# Patient Record
Sex: Female | Born: 1950 | Race: Black or African American | Hispanic: No | Smoking: Never smoker
Health system: Southern US, Community
[De-identification: ages and names within clinical notes are randomized; demographics above are authoritative.]

## PROBLEM LIST (undated history)

## (undated) DIAGNOSIS — I110 Hypertensive heart disease with heart failure: Secondary | ICD-10-CM

## (undated) DIAGNOSIS — E7800 Pure hypercholesterolemia, unspecified: Secondary | ICD-10-CM

## (undated) DIAGNOSIS — R0600 Dyspnea, unspecified: Secondary | ICD-10-CM

## (undated) DIAGNOSIS — M5135 Other intervertebral disc degeneration, thoracolumbar region: Secondary | ICD-10-CM

## (undated) DIAGNOSIS — M5030 Other cervical disc degeneration, unspecified cervical region: Secondary | ICD-10-CM

## (undated) DIAGNOSIS — E119 Type 2 diabetes mellitus without complications: Secondary | ICD-10-CM

## (undated) DIAGNOSIS — K219 Gastro-esophageal reflux disease without esophagitis: Secondary | ICD-10-CM

## (undated) DIAGNOSIS — I6789 Other cerebrovascular disease: Secondary | ICD-10-CM

## (undated) DIAGNOSIS — I10 Essential (primary) hypertension: Secondary | ICD-10-CM

## (undated) DIAGNOSIS — F32A Depression, unspecified: Secondary | ICD-10-CM

## (undated) DIAGNOSIS — I4510 Unspecified right bundle-branch block: Secondary | ICD-10-CM

## (undated) DIAGNOSIS — M1990 Unspecified osteoarthritis, unspecified site: Secondary | ICD-10-CM

## (undated) DIAGNOSIS — N183 Chronic kidney disease, stage 3 unspecified: Secondary | ICD-10-CM

## (undated) DIAGNOSIS — G4733 Obstructive sleep apnea (adult) (pediatric): Secondary | ICD-10-CM

## (undated) DIAGNOSIS — I5020 Unspecified systolic (congestive) heart failure: Secondary | ICD-10-CM

## (undated) HISTORY — PX: COLONOSCOPY: SHX174

## (undated) HISTORY — PX: BACK SURGERY: SHX140

## (undated) HISTORY — PX: CHOLECYSTECTOMY: SHX55

---

## 1973-08-21 HISTORY — PX: TUBAL LIGATION: SHX77

## 2004-08-21 HISTORY — PX: ROTATOR CUFF REPAIR: SHX139

## 2012-08-21 HISTORY — PX: TOTAL KNEE ARTHROPLASTY: SHX125

## 2013-04-24 ENCOUNTER — Ambulatory Visit: Payer: Self-pay | Admitting: General Practice

## 2013-04-24 LAB — URINALYSIS, COMPLETE
Glucose,UR: NEGATIVE mg/dL (ref 0–75)
Ketone: NEGATIVE
Nitrite: NEGATIVE
RBC,UR: NONE SEEN /HPF (ref 0–5)
Specific Gravity: 1.01 (ref 1.003–1.030)
Squamous Epithelial: 1
WBC UR: <1 /HPF (ref 0–5)

## 2013-04-24 LAB — BASIC METABOLIC PANEL
Anion Gap: 3 — ABNORMAL LOW (ref 7–16)
Calcium, Total: 9.7 mg/dL (ref 8.5–10.1)
Chloride: 104 mmol/L (ref 98–107)
Co2: 30 mmol/L (ref 21–32)
EGFR (Non-African Amer.): >60

## 2013-04-24 LAB — APTT: Activated PTT: 35.8 secs (ref 23.6–35.9)

## 2013-04-24 LAB — CBC
MCHC: 34.6 g/dL (ref 32.0–36.0)
MCV: 87 fL (ref 80–100)
Platelet: 240 10*3/uL (ref 150–440)
RDW: 13.3 % (ref 11.5–14.5)

## 2013-04-24 LAB — PROTIME-INR: INR: 1.1

## 2013-04-28 ENCOUNTER — Ambulatory Visit: Payer: Self-pay | Admitting: General Practice

## 2013-06-03 ENCOUNTER — Ambulatory Visit: Payer: Self-pay | Admitting: General Practice

## 2013-06-09 ENCOUNTER — Inpatient Hospital Stay: Payer: Self-pay | Admitting: General Practice

## 2013-06-10 LAB — BASIC METABOLIC PANEL
Calcium, Total: 8.5 mg/dL (ref 8.5–10.1)
Co2: 26 mmol/L (ref 21–32)
Creatinine: 0.86 mg/dL (ref 0.60–1.30)
EGFR (African American): >60
Osmolality: 280 (ref 275–301)
Sodium: 140 mmol/L (ref 136–145)

## 2013-06-10 LAB — PLATELET COUNT: Platelet: 183 10*3/uL (ref 150–440)

## 2013-06-11 LAB — BASIC METABOLIC PANEL
Anion Gap: 3 — ABNORMAL LOW (ref 7–16)
Calcium, Total: 8.5 mg/dL (ref 8.5–10.1)
Co2: 29 mmol/L (ref 21–32)
EGFR (African American): >60
EGFR (Non-African Amer.): >60
Osmolality: 280 (ref 275–301)
Potassium: 3.7 mmol/L (ref 3.5–5.1)

## 2013-06-11 LAB — HEMOGLOBIN: HGB: 9.9 g/dL — ABNORMAL LOW (ref 12.0–16.0)

## 2014-12-11 NOTE — Discharge Summary (Signed)
PATIENT NAME:  Diana Green, Diana Green MR#:  [id number] DATE OF BIRTH:  04-May-1951  DATE OF ADMISSION:  06/09/2013 DATE OF DISCHARGE:  06/12/2013   ADMITTING DIAGNOSIS: Degenerative arthrosis of the left knee.  POSTOPERATIVE DIAGNOSIS: Degenerative arthrosis of the left knee.  PROCEDURE: The patient had a left total knee arthroplasty using computer-aided navigation.   SURGEON: Illene LabradorJames P. Angie FavaHooten Jr., MD   ANESTHESIA: Spinal.   ESTIMATED BLOOD LOSS: 100 mL.   TOURNIQUET TIME: 93 minutes.   DRAINS: Two medium drains to reinfusion system.   IMPLANTS USED: DePuy PFC Sigma size 4 posterior stabilized femoral component cemented, size 4 MBT tibial component (cemented), 41 mm 3 peg oval dome patella cemented, 10 mm stabilized rotating platform polyethylene insert. Gentamicin bone cement was utilized.   The patient was stabilized, brought down to the recovery room and treated with physical therapy and pain control.   HISTORY: The patient is a 64 year old female who presented for an upcoming left total knee arthroplasty. The patient has been refractory to conservative treatment. The patient has tried activity modifications as well as cortisone injections. The patient has tried Synvisc with no relief.   PHYSICAL EXAMINATION:  GENERAL: Alert female with antalgic gait and a valgus thrust to the left knee.  LUNGS: Clear to auscultation.  CARDIOVASCULAR: Regular rate and rhythm with no murmur.  MUSCULOSKELETAL: In regard to the left lower extremity, the patient does have positive effusion with tenderness laterally. The patient has full extension at 103 degrees of flexion.   HOSPITAL COURSE: After initial admission on 06/09/2013, the patient was brought to the orthopedic floor. The patient had a hemoglobin on postoperative day 1 of 9.9, which remained there at 9.9 on postoperative day 2. The patient did receive 500 mL of Autovac re-transfusion. The patient did well with physical therapy, initially bed to chair,  and progressed up to ambulating 60 feet and had up to 95 degrees range of motion with flexion.   CONDITION AT DISCHARGE: Stable.   DISPOSITION: The patient was sent to rehab for physical therapy and occupational therapy.   DISCHARGE INSTRUCTIONS: The [facility] with Van ClinesJon Wolfe. Activity: The patient will do weight bear as tolerated on the affected leg. The patient will raise the left leg with 1 to 2 pillows and use thigh-high TED hose on both legs, to be removed 1 hour every 8-hour shift. The patient will have her heels elevated off the bed and be encouraged to do cough and deep breathing. The patient will have regular diet. The patient will use Polar Care to decrease swelling and try not to get the dressing wet or dirty and try to leave it on at this time, although have it changed on a p.r.n. basis at rehab.  The rehab center will call the clinic if there is any bright red bleeding or fever greater than 101.5, any calf pain or bowel or bladder difficulty. The patient will do physical therapy and occupational therapy per protocol.   DISCHARGE MEDICATIONS:  1. Tribenzor 5 mg 1 tablet daily.  2. Zocor 20 mg p.o. daily.  3. Atenolol 100 mg 1 tablet daily.  4. Tylenol 500 mg 1 tablet q.4 hours as needed for pain or fever greater than 100.4.  5. Oxycodone 5 mg q.4 hours as needed for severe pain. 6. Celebrex 200 mg p.o. b.i.d.  7. Milk of Magnesia 30 mL b.i.d.  8. Lovenox 40 mg subcutaneous once a day for 14 days, then discontinue  and begin aspirin 81 mg daily.  9. Paroxetine 20 mg 1 tablet p.o. daily.  10. Senokot-S 1 tablet p.o. b.i.d. 11. Pantoprazole 40 mg p.o. b.i.d.     ____________________________ J. Dedra Skeens, [place] jtm:lb D: 06/12/2013 07:27:53 ET T: 06/12/2013 07:53:02 ET JOB#: [id number]  cc: J. Dedra Skeens, [place], <Dictator> J Kanai Berrios [facility] PA ELECTRONICALLY SIGNED 06/14/2013 6:04

## 2014-12-11 NOTE — Op Note (Signed)
PATIENT NAME:  Diana Green, Diana Green MR#:  [id number] DATE OF BIRTH:  [birth year]  DATE OF PROCEDURE:  06/09/2013  PREOPERATIVE DIAGNOSIS: Degenerative arthrosis of the left knee.   POSTOPERATIVE DIAGNOSIS: Degenerative arthrosis of the right knee.   PROCEDURE PERFORMED: Left total knee arthroplasty using computer-assisted navigation.   SURGEON: Francesco Sor, M.D.   ASSISTANT: Van Clines, PA (required to maintain retraction throughout the procedure).   ANESTHESIA: Spinal.   ESTIMATED BLOOD LOSS: 100 mL.   FLUIDS REPLACED: 2800 mL of crystalloid.   TOURNIQUET TIME: 93 minutes.   DRAINS: Two medium drains to reinfusion system.   SOFT TISSUE RELEASES: Anterior cruciate ligament, posterior cruciate ligament, deep medial collateral ligament, patellofemoral ligament, and posterolateral corner.   IMPLANTS UTILIZED: DePuy PFC Sigma size 4 posterior stabilized femoral component (cemented), size 4 MBT tibial component (cemented), 41 mm 3 peg oval dome patella (cemented), and a 10 mm stabilized rotating platform polyethylene insert. Gentamicin bone cement was utilized.   INDICATIONS FOR SURGERY: The patient is a 64 year old female who has been seen for complaints of progressive left knee pain. X-rays demonstrated severe degenerative changes in tricompartmental fashion with a relative valgus deformity. After discussion of the risks and benefits of surgical intervention, the patient expressed understanding of the risks and benefits and agreed with plans for surgical intervention.   PROCEDURE IN DETAIL: The patient was brought into the Operating Room and, after adequate spinal anesthesia was achieved, a tourniquet was placed on the patient's upper left thigh. The patient's left knee and leg were cleaned and prepped with alcohol and DuraPrep, draped in the usual sterile fashion. A "timeout" was performed as per usual protocol. The left lower extremity was exsanguinated using an Esmarch, and the tourniquet was  inflated to 300 mmHg.   An anterior longitudinal incision was made, followed by a standard mid vastus approach. A moderate effusion was evacuated. The deep fibers of the medial collateral ligament were elevated in a subperiosteal fashion off the medial flare of the tibia so as to maintain a continuous soft tissue sleeve. The patella was subluxed laterally and the patellofemoral ligament was incised.   Inspection of the knee demonstrated severe degenerative changes with full-thickness loss of articular cartilage noted to all compartments. Prominent osteophytes were debrided using a rongeur. Anterior and posterior cruciate ligaments were excised. Two 4.0 mm Schanz pins were inserted into the femur and into the tibia for attachment of the ray of trackers used for computer-assisted navigation. Hip center was identified using circumduction technique. Distal landmarks were mapped using the computer. The distal femur and proximal tibia were mapped using the computer.   Distal femoral cutting guide was positioned using computer-assisted navigation so as to achieve a 5 degree distal valgus cut. Cut was performed and verified using the computer. Distal femur was sized, and it was felt that a size 4 femoral component was appropriate. A size 4 cutting guide was positioned, and the anterior cut was performed and verified using the computer. This was followed by completion of the posterior and chamfer cuts. Femoral cutting guide for the central box was then positioned and the central box cut was performed.   Attention was then directed to the proximal tibia. Medial and lateral menisci were excised. The extramedullary tibial cutting guide was positioned using computer-assisted navigation so as to achieve a 0 degree varus valgus alignment and 0 degrees posterior slope. Cut was performed and verified using the computer. The proximal tibia was sized, and it was felt that a size 4  tibial tray was appropriate. Tibial and  femoral trials were inserted, followed by insertion of a 10 mm polyethylene insert.   The knee was felt to be tight laterally. Trial components were removed and the knee was brought into full extension and distracted using Moreland retractors. The posterolateral corner was then carefully released using a combination of electrocautery and Metzenbaum scissors. This allowed for good balance with the knee in extension. Trial components were reinserted. Prominent posterior osteophytes were debrided using osteotomes and curettes. A 10 mm polyethylene trial was inserted and the knee was placed through range of motion. Excellent mediolateral soft tissue balancing was appreciated both in full extension and in flexion.   Finally, the patella was cut and prepared so as to accommodate a 41 mm three peg oval dome patella. Patellar trial was placed and the knee was placed through a range of motion with excellent patellar tracking appreciated. The femoral component trial was removed. Central post hole for the tibial component was reamed, followed by insertion of a keel punch. Tibial tray was then removed. The cut surfaces of bone were irrigated with copious amounts of normal saline with antibiotic solution using pulsatile lavage and then suctioned dry.   Polymethyl methacrylate cement with gentamicin was prepared in the usual fashion using a vacuum mixer. Cement was applied to the cut surface of the proximal tibia as well as along the undersurface of a size 4 MBT tibial component. The tibial component was positioned and impacted into place. Excess cement was removed using Personal assistantreer elevators. Cement was then applied to the cut surface of the femur as well as along the posterior flanges of a size 4 posterior stabilized femoral component. Femoral component was positioned and impacted into place. Excess cement was removed using Personal assistantreer elevators. A 10 mm polyethylene trial was inserted and the knee was brought in full extension with  steady axial compression applied. Finally, cement was applied to the backside of a 41 mm three peg oval dome patella, and patellar component was positioned and patellar clamp applied. Excess cement was removed using Personal assistantreer elevators.   After adequate curing of cement, the tourniquet was deflated after total tourniquet time of 93 minutes. Hemostasis was achieved using electrocautery. The knee was irrigated with copious amounts of normal saline with antibiotic solution, using pulsatile lavage, and then suctioned dry. The knee was inspected for any residual cement debris. 20 mL of 1.3% Exparel in 40 mL of normal saline was injected along the posterior capsule, medial and lateral recesses, and along the arthrotomy site.   A 10 mm stabilized rotating platform polyethylene insert was inserted, and the knee was placed through a range of motion with excellent mediolateral soft tissue balancing appreciated and excellent patellar tracking noted. Two medium drains were placed in the wound bed and brought out through a separate stab incision to be attached to a reinfusion system. The medial parapatellar portion of the arthrotomy was then reapproximated using interrupted sutures of #1 Vicryl. The subcutaneous tissue was approximated in layers using first 0 Vicryl, followed by 2-0 Vicryl. 30 mL of 0.25% Marcaine with epinephrine was injected in the subcutaneous tissue along the skin incision. Skin was then reapproximated using skin staples. A sterile dressing was applied.   The patient tolerated the procedure well. She was transported to the recovery room in stable condition.    ____________________________ Illene LabradorJames P. Angie FavaHooten Jr., MD jph:cg D: 06/10/2013 06:25:11 ET T: 06/10/2013 06:58:35 ET JOB#: [id number]  cc: Illene LabradorJames P. Angie FavaHooten Jr., MD, <Dictator> JAMES P  Angie Fava MD ELECTRONICALLY SIGNED 06/10/2013 22:35

## 2017-04-28 ENCOUNTER — Emergency Department: Payer: Medicare PPO

## 2017-04-28 ENCOUNTER — Encounter: Payer: Self-pay | Admitting: Emergency Medicine

## 2017-04-28 ENCOUNTER — Emergency Department
Admission: EM | Admit: 2017-04-28 | Discharge: 2017-04-28 | Disposition: A | Discharge status: Home / Self Care | Payer: Medicare PPO | Attending: Emergency Medicine | Admitting: Emergency Medicine

## 2017-04-28 DIAGNOSIS — I10 Essential (primary) hypertension: Secondary | ICD-10-CM | POA: Diagnosis not present

## 2017-04-28 DIAGNOSIS — M791 Myalgia: Secondary | ICD-10-CM | POA: Diagnosis not present

## 2017-04-28 DIAGNOSIS — M7918 Myalgia, other site: Secondary | ICD-10-CM

## 2017-04-28 HISTORY — DX: Essential (primary) hypertension: I10

## 2017-04-28 HISTORY — DX: Pure hypercholesterolemia, unspecified: E78.00

## 2017-04-28 MED ORDER — OXYCODONE-ACETAMINOPHEN 5-325 MG PO TABS
1.0000 | ORAL_TABLET | Freq: Once | ORAL | Status: AC
Start: 2017-04-28 — End: 2017-04-28
  Administered 2017-04-28: 1 via ORAL
  Filled 2017-04-28: qty 1

## 2017-04-28 MED ORDER — BACLOFEN 10 MG PO TABS: 5.0000 mg | ORAL_TABLET | Freq: Two times a day (BID) | ORAL | 0 refills | Status: AC

## 2017-04-28 MED ORDER — IBUPROFEN 600 MG PO TABS: 600.0000 mg | ORAL_TABLET | Freq: Four times a day (QID) | ORAL | 0 refills | Status: DC | PRN

## 2017-04-28 NOTE — ED Notes (Signed)
Pt alert and oriented X4, active, cooperative, pt in NAD. RR even and unlabored, color WNL.  Pt informed to return if any life threatening symptoms occur.   
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
## 2020-05-17 ENCOUNTER — Other Ambulatory Visit: Payer: Self-pay

## 2020-05-17 ENCOUNTER — Encounter: Payer: Self-pay | Admitting: Emergency Medicine

## 2020-05-17 ENCOUNTER — Emergency Department
Admission: EM | Admit: 2020-05-17 | Discharge: 2020-05-17 | Disposition: A | Discharge status: Home / Self Care | Payer: Medicare Other | Attending: Emergency Medicine | Admitting: Emergency Medicine

## 2020-05-17 ENCOUNTER — Emergency Department: Payer: Medicare Other

## 2020-05-17 DIAGNOSIS — I10 Essential (primary) hypertension: Secondary | ICD-10-CM | POA: Diagnosis not present

## 2020-05-17 DIAGNOSIS — M545 Low back pain, unspecified: Secondary | ICD-10-CM

## 2020-05-17 DIAGNOSIS — M542 Cervicalgia: Secondary | ICD-10-CM | POA: Diagnosis present

## 2020-05-17 DIAGNOSIS — M25562 Pain in left knee: Secondary | ICD-10-CM | POA: Insufficient documentation

## 2020-05-17 DIAGNOSIS — Z79899 Other long term (current) drug therapy: Secondary | ICD-10-CM | POA: Diagnosis not present

## 2020-05-17 MED ORDER — MELOXICAM 7.5 MG PO TABS: 7.5000 mg | ORAL_TABLET | Freq: Every day | ORAL | 0 refills | Status: AC

## 2020-05-17 MED ORDER — TIZANIDINE HCL 4 MG PO TABS: 4.0000 mg | ORAL_TABLET | Freq: Three times a day (TID) | ORAL | 0 refills | Status: AC
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
## 2021-03-24 ENCOUNTER — Other Ambulatory Visit: Payer: Self-pay

## 2021-05-05 ENCOUNTER — Other Ambulatory Visit: Payer: Self-pay | Admitting: Neurosurgery

## 2021-05-05 ENCOUNTER — Other Ambulatory Visit (HOSPITAL_COMMUNITY): Payer: Self-pay | Admitting: Neurosurgery

## 2021-05-05 DIAGNOSIS — G959 Disease of spinal cord, unspecified: Secondary | ICD-10-CM

## 2021-05-05 DIAGNOSIS — M5104 Intervertebral disc disorders with myelopathy, thoracic region: Secondary | ICD-10-CM

## 2021-05-05 DIAGNOSIS — M48062 Spinal stenosis, lumbar region with neurogenic claudication: Secondary | ICD-10-CM

## 2021-05-24 ENCOUNTER — Ambulatory Visit
Admission: RE | Admit: 2021-05-24 | Discharge: 2021-05-24 | Disposition: A | Discharge status: Home / Self Care | Payer: Medicare (Managed Care) | Source: Ambulatory Visit | Attending: Neurosurgery | Admitting: Neurosurgery

## 2021-05-24 ENCOUNTER — Other Ambulatory Visit: Payer: Self-pay

## 2021-05-24 DIAGNOSIS — G959 Disease of spinal cord, unspecified: Secondary | ICD-10-CM

## 2021-05-24 DIAGNOSIS — M5104 Intervertebral disc disorders with myelopathy, thoracic region: Secondary | ICD-10-CM | POA: Diagnosis present

## 2021-05-24 DIAGNOSIS — M48062 Spinal stenosis, lumbar region with neurogenic claudication: Secondary | ICD-10-CM

## 2021-06-09 ENCOUNTER — Other Ambulatory Visit: Payer: Self-pay | Admitting: Internal Medicine

## 2021-06-09 DIAGNOSIS — Z1231 Encounter for screening mammogram for malignant neoplasm of breast: Secondary | ICD-10-CM

## 2021-06-13 ENCOUNTER — Other Ambulatory Visit: Payer: Self-pay | Admitting: Neurosurgery

## 2021-06-28 ENCOUNTER — Other Ambulatory Visit: Payer: Self-pay

## 2021-06-28 ENCOUNTER — Other Ambulatory Visit
Admission: RE | Admit: 2021-06-28 | Discharge: 2021-06-28 | Disposition: A | Discharge status: Home / Self Care | Payer: Medicare (Managed Care) | Source: Ambulatory Visit | Attending: Neurosurgery | Admitting: Neurosurgery

## 2021-06-28 DIAGNOSIS — I10 Essential (primary) hypertension: Secondary | ICD-10-CM | POA: Diagnosis not present

## 2021-06-28 DIAGNOSIS — Z01818 Encounter for other preprocedural examination: Secondary | ICD-10-CM | POA: Insufficient documentation

## 2021-06-28 DIAGNOSIS — R54 Age-related physical debility: Secondary | ICD-10-CM | POA: Diagnosis not present

## 2021-06-28 HISTORY — DX: Depression, unspecified: F32.A

## 2021-06-28 HISTORY — DX: Unspecified osteoarthritis, unspecified site: M19.90

## 2021-06-28 LAB — BASIC METABOLIC PANEL
Anion gap: 7 (ref 5–15)
BUN: 18 mg/dL (ref 8–23)
CO2: 31 mmol/L (ref 22–32)
Calcium: 10.2 mg/dL (ref 8.9–10.3)
Chloride: 100 mmol/L (ref 98–111)
Creatinine, Ser: 1.11 mg/dL — ABNORMAL HIGH (ref 0.44–1.00)
GFR, Estimated: 53 mL/min — ABNORMAL LOW (ref >60)
Glucose, Bld: 105 mg/dL — ABNORMAL HIGH (ref 70–99)
Potassium: 4.3 mmol/L (ref 3.5–5.1)
Sodium: 138 mmol/L (ref 135–145)

## 2021-06-28 LAB — CBC
HCT: 35.5 % — ABNORMAL LOW (ref 36.0–46.0)
Hemoglobin: 11.4 g/dL — ABNORMAL LOW (ref 12.0–15.0)
MCH: 29.5 pg (ref 26.0–34.0)
MCHC: 32.1 g/dL (ref 30.0–36.0)
MCV: 91.7 fL (ref 80.0–100.0)
Platelets: 238 10*3/uL (ref 150–400)
RBC: 3.87 MIL/uL (ref 3.87–5.11)
RDW: 12.5 % (ref 11.5–15.5)
WBC: 4.9 10*3/uL (ref 4.0–10.5)
nRBC: 0 % (ref 0.0–0.2)

## 2021-06-28 LAB — URINALYSIS, ROUTINE W REFLEX MICROSCOPIC
Bilirubin Urine: NEGATIVE
Glucose, UA: NEGATIVE mg/dL
Hgb urine dipstick: NEGATIVE
Ketones, ur: NEGATIVE mg/dL
Leukocytes,Ua: NEGATIVE
Nitrite: NEGATIVE
Protein, ur: NEGATIVE mg/dL
Specific Gravity, Urine: 1.021 (ref 1.005–1.030)
pH: 5 (ref 5.0–8.0)

## 2021-06-28 LAB — APTT: aPTT: 43 seconds — ABNORMAL HIGH (ref 24–36)

## 2021-06-28 LAB — SURGICAL PCR SCREEN
MRSA, PCR: NEGATIVE
Staphylococcus aureus: NEGATIVE

## 2021-06-28 LAB — PROTIME-INR
INR: 1 (ref 0.8–1.2)
Prothrombin Time: 13.3 seconds (ref 11.4–15.2)

## 2021-06-28 LAB — TYPE AND SCREEN
ABO/RH(D): A POS
Antibody Screen: NEGATIVE

## 2021-06-28 NOTE — Patient Instructions (Addendum)
Your procedure is scheduled on:  Friday, November 18 Report to the Registration Desk on the 1st floor of the [facility] To find out your arrival time, please call [phone] between 1PM - 3PM on: Thursday, November 17  REMEMBER: Instructions that are not followed completely may result in serious medical risk, up to and including death; or upon the discretion of your surgeon and anesthesiologist your surgery may need to be rescheduled.  Do not eat food after midnight the night before surgery.  No gum chewing, lozengers or hard candies.  You may however, drink CLEAR liquids up to 2 hours before you are scheduled to arrive for your surgery. Do not drink anything within 2 hours of your scheduled arrival time.  Clear liquids include: - water  - apple juice without pulp - gatorade (not RED, PURPLE, OR BLUE) - black coffee or tea (Do NOT add milk or creamers to the coffee or tea) Do NOT drink anything that is not on this list.  TAKE THESE MEDICATIONS THE MORNING OF SURGERY WITH A SIP OF WATER:  Atenolol Duloxetine (cymbalta) Gabapentin  One week prior to surgery: starting November 11 Stop CELEBREX, Anti-inflammatories (NSAIDS) such as Advil, Aleve, Ibuprofen, Motrin, Naproxen, Naprosyn and Aspirin based products such as Excedrin, Goodys Powder, BC Powder. Stop ANY OVER THE COUNTER supplements until after surgery. You may however, continue to take Tylenol if needed for pain up until the day of surgery.  No Alcohol for 24 hours before or after surgery.  No Smoking including e-cigarettes for 24 hours prior to surgery.  No chewable tobacco products for at least 6 hours prior to surgery.  No nicotine patches on the day of surgery.  Do not use any "recreational" drugs for at least a week prior to your surgery.  Please be advised that the combination of cocaine and anesthesia may have negative outcomes, up to and including death. If you test positive for cocaine, your surgery will be  cancelled.  On the morning of surgery brush your teeth with toothpaste and water, you may rinse your mouth with mouthwash if you wish. Do not swallow any toothpaste or mouthwash.  Use CHG Soap or wipes as directed on instruction sheet.  Do not wear jewelry, make-up, hairpins, clips or nail polish.  Do not wear lotions, powders, or perfumes.   Do not shave body from the neck down 48 hours prior to surgery just in case you cut yourself which could leave a site for infection.  Also, freshly shaved skin may become irritated if using the CHG soap.  Contact lenses, hearing aids and dentures may not be worn into surgery.  Do not bring valuables to the hospital. [facility] is not responsible for any missing/lost belongings or valuables.   Notify your doctor if there is any change in your medical condition (cold, fever, infection).  Wear comfortable clothing (specific to your surgery type) to the hospital.  After surgery, you can help prevent lung complications by doing breathing exercises.  Take deep breaths and cough every 1-2 hours. Your doctor may order a device called an Incentive Spirometer to help you take deep breaths.  If you are being admitted to the hospital overnight, leave your suitcase in the car. After surgery it may be brought to your room.  If you are being discharged the day of surgery, you will not be allowed to drive home. You will need a responsible adult (18 years or older) to drive you home and stay with you that  night.   If you are taking public transportation, you will need to have a responsible adult (18 years or older) with you. Please confirm with your physician that it is acceptable to use public transportation.   Please call the Pre-admissions Testing Dept. at [phone] if you have any questions about these instructions.  Surgery Visitation Policy:  Patients undergoing a surgery or procedure may have one family member or support person with them as  long as that person is not COVID-19 positive or experiencing its symptoms.  That person may remain in the waiting area during the procedure and may rotate out with other people.  Inpatient Visitation:    Visiting hours are 7 a.m. to 8 p.m. Up to two visitors ages 16+ are allowed at one time in a patient room. The visitors may rotate out with other people during the day. Visitors must check out when they leave, or other visitors will not be allowed. One designated support person may remain overnight. The visitor must pass COVID-19 screenings, use hand sanitizer when entering and exiting the patient's room and wear a mask at all times, including in the patient's room. Patients must also wear a mask when staff or their visitor are in the room. Masking is required regardless of vaccination status.

## 2021-06-29 ENCOUNTER — Ambulatory Visit
Admission: RE | Admit: 2021-06-29 | Discharge: 2021-06-29 | Disposition: A | Discharge status: Home / Self Care | Payer: Medicare (Managed Care) | Source: Ambulatory Visit | Attending: Internal Medicine | Admitting: Internal Medicine

## 2021-06-29 DIAGNOSIS — Z1231 Encounter for screening mammogram for malignant neoplasm of breast: Secondary | ICD-10-CM | POA: Diagnosis present

## 2021-07-06 ENCOUNTER — Other Ambulatory Visit: Payer: Self-pay

## 2021-07-06 ENCOUNTER — Other Ambulatory Visit
Admission: RE | Admit: 2021-07-06 | Discharge: 2021-07-06 | Disposition: A | Discharge status: Home / Self Care | Payer: Medicare (Managed Care) | Source: Ambulatory Visit | Attending: Neurosurgery | Admitting: Neurosurgery

## 2021-07-06 DIAGNOSIS — Z20822 Contact with and (suspected) exposure to covid-19: Secondary | ICD-10-CM | POA: Diagnosis not present

## 2021-07-06 DIAGNOSIS — Z01812 Encounter for preprocedural laboratory examination: Secondary | ICD-10-CM | POA: Insufficient documentation

## 2021-07-07 LAB — SARS CORONAVIRUS 2 (TAT 6-24 HRS): SARS Coronavirus 2: NEGATIVE

## 2021-07-08 ENCOUNTER — Other Ambulatory Visit: Payer: Self-pay

## 2021-07-08 ENCOUNTER — Ambulatory Visit: Payer: Medicare (Managed Care) | Admitting: Urgent Care

## 2021-07-08 ENCOUNTER — Encounter: Payer: Self-pay | Admitting: Neurosurgery

## 2021-07-08 ENCOUNTER — Ambulatory Visit: Payer: Medicare (Managed Care)

## 2021-07-08 ENCOUNTER — Encounter
Admission: RE | Disposition: A | Discharge status: Home / Self Care | Payer: Self-pay | Source: Home / Self Care | Attending: Neurosurgery

## 2021-07-08 ENCOUNTER — Observation Stay
Admission: RE | Admit: 2021-07-08 | Discharge: 2021-07-10 | Disposition: A | Discharge status: Home / Self Care | Payer: Medicare (Managed Care) | Attending: Neurosurgery | Admitting: Neurosurgery

## 2021-07-08 DIAGNOSIS — Z79899 Other long term (current) drug therapy: Secondary | ICD-10-CM | POA: Insufficient documentation

## 2021-07-08 DIAGNOSIS — M5001 Cervical disc disorder with myelopathy,  high cervical region: Secondary | ICD-10-CM | POA: Diagnosis present

## 2021-07-08 DIAGNOSIS — I10 Essential (primary) hypertension: Secondary | ICD-10-CM | POA: Insufficient documentation

## 2021-07-08 DIAGNOSIS — G959 Disease of spinal cord, unspecified: Secondary | ICD-10-CM | POA: Diagnosis present

## 2021-07-08 HISTORY — PX: ANTERIOR CERVICAL DECOMP/DISCECTOMY FUSION: SHX1161

## 2021-07-08 LAB — ABO/RH: ABO/RH(D): A POS

## 2021-07-08 SURGERY — ANTERIOR CERVICAL DECOMPRESSION/DISCECTOMY FUSION 1 LEVEL
Anesthesia: General

## 2021-07-08 MED ORDER — ATENOLOL 25 MG PO TABS
100.0000 mg | ORAL_TABLET | Freq: Every day | ORAL | Status: DC
  Administered 2021-07-09 – 2021-07-10 (×2): 100 mg via ORAL
  Filled 2021-07-08 (×2): qty 4

## 2021-07-08 MED ORDER — SUCCINYLCHOLINE CHLORIDE 200 MG/10ML IV SOSY
PREFILLED_SYRINGE | INTRAVENOUS | Status: DC | PRN
  Administered 2021-07-08: 120 mg via INTRAVENOUS

## 2021-07-08 MED ORDER — FAMOTIDINE 20 MG PO TABS
ORAL_TABLET | ORAL | Status: AC
  Administered 2021-07-08: 20 mg via ORAL
  Filled 2021-07-08: qty 1

## 2021-07-08 MED ORDER — BUPIVACAINE-EPINEPHRINE (PF) 0.5% -1:200000 IJ SOLN
INTRAMUSCULAR | Status: AC
  Filled 2021-07-08: qty 30

## 2021-07-08 MED ORDER — ACETAMINOPHEN 500 MG PO TABS
1000.0000 mg | ORAL_TABLET | Freq: Four times a day (QID) | ORAL | Status: AC
  Administered 2021-07-08 – 2021-07-09 (×4): 1000 mg via ORAL
  Filled 2021-07-08 (×3): qty 2

## 2021-07-08 MED ORDER — LOSARTAN POTASSIUM 50 MG PO TABS
100.0000 mg | ORAL_TABLET | Freq: Every day | ORAL | Status: DC
  Administered 2021-07-08 – 2021-07-09 (×2): 100 mg via ORAL
  Filled 2021-07-08 (×2): qty 2

## 2021-07-08 MED ORDER — LIDOCAINE HCL (PF) 2 % IJ SOLN
INTRAMUSCULAR | Status: AC
  Filled 2021-07-08: qty 5

## 2021-07-08 MED ORDER — FENTANYL CITRATE (PF) 100 MCG/2ML IJ SOLN
INTRAMUSCULAR | Status: DC | PRN
  Administered 2021-07-08 (×2): 50 ug via INTRAVENOUS

## 2021-07-08 MED ORDER — KETAMINE HCL 10 MG/ML IJ SOLN
INTRAMUSCULAR | Status: DC | PRN
  Administered 2021-07-08: 50 mg via INTRAVENOUS

## 2021-07-08 MED ORDER — FENTANYL CITRATE (PF) 100 MCG/2ML IJ SOLN
INTRAMUSCULAR | Status: AC
  Filled 2021-07-08: qty 2

## 2021-07-08 MED ORDER — OXYCODONE HCL 5 MG PO TABS
10.0000 mg | ORAL_TABLET | ORAL | Status: DC | PRN
  Administered 2021-07-08: 10 mg via ORAL
  Filled 2021-07-08: qty 2

## 2021-07-08 MED ORDER — HYDROCHLOROTHIAZIDE 25 MG PO TABS
25.0000 mg | ORAL_TABLET | Freq: Every day | ORAL | Status: DC
  Administered 2021-07-09 – 2021-07-10 (×2): 25 mg via ORAL
  Filled 2021-07-08 (×2): qty 1

## 2021-07-08 MED ORDER — OXYCODONE HCL 5 MG PO TABS
5.0000 mg | ORAL_TABLET | ORAL | Status: DC | PRN
  Administered 2021-07-09: 5 mg via ORAL
  Filled 2021-07-08: qty 1

## 2021-07-08 MED ORDER — ORAL CARE MOUTH RINSE: 15.0000 mL | Freq: Once | OROMUCOSAL | Status: AC

## 2021-07-08 MED ORDER — PROPOFOL 1000 MG/100ML IV EMUL
INTRAVENOUS | Status: AC
  Filled 2021-07-08: qty 100

## 2021-07-08 MED ORDER — REMIFENTANIL HCL 1 MG IV SOLR
INTRAVENOUS | Status: AC
  Filled 2021-07-08: qty 1000

## 2021-07-08 MED ORDER — SUCCINYLCHOLINE CHLORIDE 200 MG/10ML IV SOSY
PREFILLED_SYRINGE | INTRAVENOUS | Status: AC
  Filled 2021-07-08: qty 10

## 2021-07-08 MED ORDER — CELECOXIB 200 MG PO CAPS
200.0000 mg | ORAL_CAPSULE | Freq: Two times a day (BID) | ORAL | Status: DC
  Administered 2021-07-08 – 2021-07-10 (×5): 200 mg via ORAL
  Filled 2021-07-08 (×5): qty 1

## 2021-07-08 MED ORDER — KETAMINE HCL 50 MG/5ML IJ SOSY
PREFILLED_SYRINGE | INTRAMUSCULAR | Status: AC
  Filled 2021-07-08: qty 5

## 2021-07-08 MED ORDER — BISACODYL 5 MG PO TBEC: 5.0000 mg | DELAYED_RELEASE_TABLET | Freq: Every day | ORAL | Status: DC | PRN

## 2021-07-08 MED ORDER — MENTHOL 3 MG MT LOZG
1.0000 | LOZENGE | OROMUCOSAL | Status: DC | PRN
  Filled 2021-07-08 (×3): qty 9

## 2021-07-08 MED ORDER — CEFAZOLIN IN SODIUM CHLORIDE 3-0.9 GM/100ML-% IV SOLN
3.0000 g | INTRAVENOUS | Status: AC
  Administered 2021-07-08: 3 g via INTRAVENOUS
  Filled 2021-07-08: qty 100

## 2021-07-08 MED ORDER — ONDANSETRON HCL 4 MG/2ML IJ SOLN
INTRAMUSCULAR | Status: DC | PRN
  Administered 2021-07-08: 4 mg via INTRAVENOUS

## 2021-07-08 MED ORDER — PHENYLEPHRINE HCL-NACL 20-0.9 MG/250ML-% IV SOLN
INTRAVENOUS | Status: DC | PRN
  Administered 2021-07-08: 40 ug/min via INTRAVENOUS

## 2021-07-08 MED ORDER — ONDANSETRON HCL 4 MG/2ML IJ SOLN
INTRAMUSCULAR | Status: AC
  Filled 2021-07-08: qty 2

## 2021-07-08 MED ORDER — FENTANYL CITRATE (PF) 100 MCG/2ML IJ SOLN
25.0000 ug | INTRAMUSCULAR | Status: DC | PRN
  Administered 2021-07-08 (×3): 50 ug via INTRAVENOUS

## 2021-07-08 MED ORDER — MEPERIDINE HCL 25 MG/ML IJ SOLN: 6.2500 mg | INTRAMUSCULAR | Status: DC | PRN

## 2021-07-08 MED ORDER — DEXAMETHASONE SODIUM PHOSPHATE 10 MG/ML IJ SOLN
INTRAMUSCULAR | Status: AC
  Filled 2021-07-08: qty 1

## 2021-07-08 MED ORDER — HYDROMORPHONE HCL 1 MG/ML IJ SOLN
INTRAMUSCULAR | Status: AC
  Filled 2021-07-08: qty 1

## 2021-07-08 MED ORDER — PROPOFOL 500 MG/50ML IV EMUL
INTRAVENOUS | Status: DC | PRN
  Administered 2021-07-08: 125 ug/kg/min via INTRAVENOUS

## 2021-07-08 MED ORDER — DULOXETINE HCL 60 MG PO CPEP
60.0000 mg | ORAL_CAPSULE | Freq: Every day | ORAL | Status: DC
  Administered 2021-07-09 – 2021-07-10 (×2): 60 mg via ORAL
  Filled 2021-07-08 (×2): qty 1

## 2021-07-08 MED ORDER — ONDANSETRON HCL 4 MG PO TABS: 4.0000 mg | ORAL_TABLET | Freq: Four times a day (QID) | ORAL | Status: DC | PRN

## 2021-07-08 MED ORDER — SURGIFLO WITH THROMBIN (HEMOSTATIC MATRIX KIT) OPTIME
TOPICAL | Status: DC | PRN
  Administered 2021-07-08: 1 via TOPICAL

## 2021-07-08 MED ORDER — MORPHINE SULFATE (PF) 2 MG/ML IV SOLN: 1.0000 mg | INTRAVENOUS | Status: AC | PRN

## 2021-07-08 MED ORDER — FIBRIN SEALANT 2 ML SINGLE DOSE KIT
PACK | CUTANEOUS | Status: DC | PRN
  Administered 2021-07-08: 2 mL via TOPICAL

## 2021-07-08 MED ORDER — 0.9 % SODIUM CHLORIDE (POUR BTL) OPTIME
TOPICAL | Status: DC | PRN
  Administered 2021-07-08: 100 mL

## 2021-07-08 MED ORDER — BUPIVACAINE-EPINEPHRINE (PF) 0.5% -1:200000 IJ SOLN
INTRAMUSCULAR | Status: DC | PRN
  Administered 2021-07-08: 9 mL via PERINEURAL

## 2021-07-08 MED ORDER — MIDAZOLAM HCL 2 MG/2ML IJ SOLN
INTRAMUSCULAR | Status: AC
  Filled 2021-07-08: qty 2

## 2021-07-08 MED ORDER — CHLORHEXIDINE GLUCONATE 0.12 % MT SOLN: 15.0000 mL | Freq: Once | OROMUCOSAL | Status: AC

## 2021-07-08 MED ORDER — LACTATED RINGERS IV SOLN: INTRAVENOUS | Status: DC

## 2021-07-08 MED ORDER — FLEET ENEMA 7-19 GM/118ML RE ENEM: 1.0000 | ENEMA | Freq: Once | RECTAL | Status: DC | PRN

## 2021-07-08 MED ORDER — MIDAZOLAM HCL 2 MG/2ML IJ SOLN
INTRAMUSCULAR | Status: DC | PRN
  Administered 2021-07-08: 1 mg via INTRAVENOUS

## 2021-07-08 MED ORDER — PROPOFOL 10 MG/ML IV BOLUS
INTRAVENOUS | Status: DC | PRN
  Administered 2021-07-08: 160 mg via INTRAVENOUS

## 2021-07-08 MED ORDER — SENNOSIDES-DOCUSATE SODIUM 8.6-50 MG PO TABS
1.0000 | ORAL_TABLET | Freq: Every evening | ORAL | Status: DC | PRN
  Administered 2021-07-08 – 2021-07-09 (×2): 1 via ORAL
  Filled 2021-07-08 (×2): qty 1

## 2021-07-08 MED ORDER — HYDROMORPHONE HCL 1 MG/ML IJ SOLN
0.5000 mg | INTRAMUSCULAR | Status: DC | PRN
  Administered 2021-07-08 (×2): 0.5 mg via INTRAVENOUS

## 2021-07-08 MED ORDER — DEXAMETHASONE SODIUM PHOSPHATE 10 MG/ML IJ SOLN
INTRAMUSCULAR | Status: DC | PRN
  Administered 2021-07-08: 10 mg via INTRAVENOUS

## 2021-07-08 MED ORDER — GABAPENTIN 600 MG PO TABS
600.0000 mg | ORAL_TABLET | Freq: Two times a day (BID) | ORAL | Status: DC
  Administered 2021-07-08 – 2021-07-10 (×5): 600 mg via ORAL
  Filled 2021-07-08 (×5): qty 1

## 2021-07-08 MED ORDER — ACETAMINOPHEN 10 MG/ML IV SOLN
INTRAVENOUS | Status: AC
  Filled 2021-07-08: qty 100

## 2021-07-08 MED ORDER — TIZANIDINE HCL 4 MG PO TABS
4.0000 mg | ORAL_TABLET | Freq: Three times a day (TID) | ORAL | Status: DC | PRN
  Filled 2021-07-08 (×2): qty 1

## 2021-07-08 MED ORDER — SIMVASTATIN 20 MG PO TABS
20.0000 mg | ORAL_TABLET | Freq: Every day | ORAL | Status: DC
  Administered 2021-07-08 – 2021-07-09 (×2): 20 mg via ORAL
  Filled 2021-07-08 (×2): qty 1

## 2021-07-08 MED ORDER — CHLORHEXIDINE GLUCONATE 0.12 % MT SOLN
OROMUCOSAL | Status: AC
  Administered 2021-07-08: 15 mL via OROMUCOSAL
  Filled 2021-07-08: qty 15

## 2021-07-08 MED ORDER — SODIUM CHLORIDE 0.9 % IV SOLN: INTRAVENOUS | Status: DC

## 2021-07-08 MED ORDER — ONDANSETRON HCL 4 MG/2ML IJ SOLN: 4.0000 mg | Freq: Once | INTRAMUSCULAR | Status: DC | PRN

## 2021-07-08 MED ORDER — PHENOL 1.4 % MT LIQD
1.0000 | OROMUCOSAL | Status: DC | PRN
  Filled 2021-07-08: qty 177

## 2021-07-08 MED ORDER — PHENYLEPHRINE HCL (PRESSORS) 10 MG/ML IV SOLN
INTRAVENOUS | Status: DC | PRN
  Administered 2021-07-08: 240 ug via INTRAVENOUS
  Administered 2021-07-08 (×3): 160 ug via INTRAVENOUS

## 2021-07-08 MED ORDER — ONDANSETRON HCL 4 MG/2ML IJ SOLN: 4.0000 mg | Freq: Four times a day (QID) | INTRAMUSCULAR | Status: DC | PRN

## 2021-07-08 MED ORDER — REMIFENTANIL HCL 1 MG IV SOLR
INTRAVENOUS | Status: DC | PRN
  Administered 2021-07-08: .1 ug/kg/min via INTRAVENOUS

## 2021-07-08 MED ORDER — FAMOTIDINE 20 MG PO TABS: 20.0000 mg | ORAL_TABLET | Freq: Once | ORAL | Status: AC

## 2021-07-08 MED ORDER — SODIUM CHLORIDE 0.9% FLUSH: 3.0000 mL | Freq: Two times a day (BID) | INTRAVENOUS | Status: DC

## 2021-07-08 MED ORDER — SODIUM CHLORIDE 0.9 % IV SOLN: 250.0000 mL | INTRAVENOUS | Status: DC

## 2021-07-08 MED ORDER — ACETAMINOPHEN 10 MG/ML IV SOLN
INTRAVENOUS | Status: DC | PRN
  Administered 2021-07-08: 1000 mg via INTRAVENOUS

## 2021-07-08 MED ORDER — SODIUM CHLORIDE 0.9% FLUSH: 3.0000 mL | INTRAVENOUS | Status: DC | PRN

## 2021-07-08 MED ORDER — DOCUSATE SODIUM 100 MG PO CAPS
100.0000 mg | ORAL_CAPSULE | Freq: Two times a day (BID) | ORAL | Status: DC
  Administered 2021-07-08 – 2021-07-10 (×5): 100 mg via ORAL
  Filled 2021-07-08 (×5): qty 1

## 2021-07-08 SURGICAL SUPPLY — 63 items
BASKET BONE COLLECTION (BASKET) IMPLANT
BULB RESERV EVAC DRAIN JP 100C (MISCELLANEOUS) IMPLANT
BUR NEURO DRILL SOFT 3.0X3.8M (BURR) ×2 IMPLANT
CHLORAPREP W/TINT 26 (MISCELLANEOUS) ×4 IMPLANT
COUNTER NEEDLE 20/40 LG (NEEDLE) ×2 IMPLANT
CUP MEDICINE 2OZ PLAST GRAD ST (MISCELLANEOUS) ×2 IMPLANT
DERMABOND ADVANCED (GAUZE/BANDAGES/DRESSINGS) ×1
DERMABOND ADVANCED .7 DNX12 (GAUZE/BANDAGES/DRESSINGS) ×1 IMPLANT
DRAIN CHANNEL JP 10F RND 20C F (MISCELLANEOUS) IMPLANT
DRAPE C ARM PK CFD 31 SPINE (DRAPES) ×2 IMPLANT
DRAPE LAPAROTOMY 77X122 PED (DRAPES) ×2 IMPLANT
DRAPE MICROSCOPE SPINE 48X150 (DRAPES) ×2 IMPLANT
DRAPE SURG 17X11 SM STRL (DRAPES) ×8 IMPLANT
DRSG TEGADERM 6X8 (GAUZE/BANDAGES/DRESSINGS) ×2 IMPLANT
ELECT CAUTERY BLADE TIP 2.5 (TIP) ×2
ELECT REM PT RETURN 9FT ADLT (ELECTROSURGICAL) ×2
ELECTRODE CAUTERY BLDE TIP 2.5 (TIP) ×1 IMPLANT
ELECTRODE REM PT RTRN 9FT ADLT (ELECTROSURGICAL) ×1 IMPLANT
FEE INTRAOP CADWELL SUPPLY NCS (MISCELLANEOUS) ×1 IMPLANT
FEE INTRAOP MONITOR IMPULS NCS (MISCELLANEOUS) ×1 IMPLANT
GAUZE 4X4 16PLY ~~LOC~~+RFID DBL (SPONGE) ×2 IMPLANT
GLOVE SURG SYN 6.5 ES PF (GLOVE) ×2 IMPLANT
GLOVE SURG SYN 8.5  E (GLOVE) ×3
GLOVE SURG SYN 8.5 E (GLOVE) ×3 IMPLANT
GLOVE SURG UNDER POLY LF SZ6.5 (GLOVE) ×2 IMPLANT
GOWN SRG LRG LVL 4 IMPRV REINF (GOWNS) ×1 IMPLANT
GOWN SRG XL LVL 3 NONREINFORCE (GOWNS) ×1 IMPLANT
GOWN STRL NON-REIN TWL XL LVL3 (GOWNS) ×1
GOWN STRL REIN LRG LVL4 (GOWNS) ×1
GRADUATE 1200CC STRL 31836 (MISCELLANEOUS) ×2 IMPLANT
GRAFT DURAGEN MATRIX 1WX1L (Tissue) ×2 IMPLANT
INTRAOP CADWELL SUPPLY FEE NCS (MISCELLANEOUS) ×1
INTRAOP DISP SUPPLY FEE NCS (MISCELLANEOUS) ×1
INTRAOP MONITOR FEE IMPULS NCS (MISCELLANEOUS) ×1
INTRAOP MONITOR FEE IMPULSE (MISCELLANEOUS) ×1
KIT TURNOVER KIT A (KITS) ×2 IMPLANT
MANIFOLD NEPTUNE II (INSTRUMENTS) ×2 IMPLANT
MARKER SKIN DUAL TIP RULER LAB (MISCELLANEOUS) ×4 IMPLANT
NDL SAFETY ECLIPSE 18X1.5 (NEEDLE) ×1 IMPLANT
NEEDLE HYPO 18GX1.5 SHARP (NEEDLE) ×1
NEEDLE HYPO 22GX1.5 SAFETY (NEEDLE) ×2 IMPLANT
NS IRRIG 1000ML POUR BTL (IV SOLUTION) ×2 IMPLANT
PACK LAMINECTOMY NEURO (CUSTOM PROCEDURE TRAY) ×2 IMPLANT
PAD ARMBOARD 7.5X6 YLW CONV (MISCELLANEOUS) ×4 IMPLANT
PIN CASPAR 14 (PIN) ×1 IMPLANT
PIN CASPAR 14MM (PIN) ×2
PLATE ANT CERV XTEND 1 LV 14 (Plate) ×2 IMPLANT
PUTTY DBX 1CC (Putty) ×2 IMPLANT
PUTTY DBX 1CC DEPUY (Putty) ×1 IMPLANT
SCREW VAR 4.2 XD SELF DRILL 16 (Screw) ×8 IMPLANT
SCREW XTEND SELF DRILL 4.6X16 (Screw) ×2 IMPLANT
SPACER C HEDRON 12X14 7M 7D (Spacer) ×2 IMPLANT
SPONGE KITTNER 5P (MISCELLANEOUS) ×2 IMPLANT
STAPLER SKIN PROX 35W (STAPLE) IMPLANT
SURGIFLO W/THROMBIN 8M KIT (HEMOSTASIS) ×2 IMPLANT
SUT V-LOC 90 ABS DVC 3-0 CL (SUTURE) ×2 IMPLANT
SUT VIC AB 3-0 SH 8-18 (SUTURE) ×2 IMPLANT
SYR 30ML LL (SYRINGE) ×2 IMPLANT
TAPE CLOTH 3X10 WHT NS LF (GAUZE/BANDAGES/DRESSINGS) ×2 IMPLANT
TOWEL OR 17X26 4PK STRL BLUE (TOWEL DISPOSABLE) ×6 IMPLANT
TRAY FOLEY MTR SLVR 16FR STAT (SET/KITS/TRAYS/PACK) IMPLANT
TUBING CONNECTING 10 (TUBING) ×2 IMPLANT
WATER STERILE IRR 500ML POUR (IV SOLUTION) ×2 IMPLANT

## 2021-07-08 NOTE — Op Note (Signed)
Indications: Diana Green is suffering from cervical myelopathy. she failed conservative management, and elected to proceed with surgery.  Findings: cervical stenosis  Preoperative Diagnosis: Cervical myelopathy G95.9 Postoperative Diagnosis: same   EBL: 25 ml IVF: 1000 ml Drains: none Disposition: Extubated and Stable to PACU Complications: none  No foley catheter was placed.   Preoperative Note:   Risks of surgery discussed include: infection, bleeding, stroke, coma, death, paralysis, CSF leak, nerve/spinal cord injury, numbness, tingling, weakness, complex regional pain syndrome, recurrent stenosis and/or disc herniation, vascular injury, development of instability, neck/back pain, need for further surgery, persistent symptoms, development of deformity, and the risks of anesthesia. The patient understood these risks and agreed to proceed.  Operative Note:   Procedure:  1) Anterior cervical diskectomy and fusion at C3-4 2) Anterior cervical instrumentation at C3-4 using Globus Xtend 3) Insertion of biomechanical device at C3-4   Procedure: After obtaining informed consent, the patient taken to the operating room, placed in supine position, general anesthesia induced.  The patient had a small shoulder roll placed behind their shoulders.  The patient received preop antibiotics and IV Decadron.  The patient had a neck incision outlined, was prepped and draped in usual sterile fashion. The incision was injected with local anesthetic.   An incision was opened, dissection taken down medial to the carotid artery and jugular vein, lateral to the trachea and esophagus.  The prevertebral fascia was identified, and a localizing x-ray demonstrated the correct level.  The longus colli were dissected laterally, and self-retaining retractors placed to open the operative field. The microscope was then brought into the field.  With this complete, distractor pins were placed in the vertebral bodies of  C3 and C4. The distractor was placed, and the annulus at C3/4 was opened using a bovie.  Curettes and pituitary rongeurs used to remove the majority of disk, then the drill was used to remove the posterior osteophyte and begin the foraminotomies. The nerve hook was used to elevate the posterior longitudinal ligament, which was then removed with Kerrison rongeurs. Bilateral foraminotomies were performed. The ligament was continuous and adherent to the dura, causing a small durotomy but minimal CSF leak. The microblunt nerve hook could be passed out the foramen bilaterally.   Meticulous hemostasis was obtained.  A piece of duragen was placed to plug the durotomy. A biomechanical device (Globus Hedron C 7 mm height x 14 mm width by 12 mm depth) was placed at C3/4. The device had been filled with allograft for aid in arthrodesis. Vistaseel was then placed on the biomechanical device.  The caspar distractor was removed, and bone wax used for hemostasis. A 14 mm Globus Xtend plate was chosen.  Two screws placed in each vertebral body, respectively making sure the screws were behind the locking mechanism.  Final AP and lateral radiographs were taken.   With everything in good position, the wound was irrigated copiously with bacitracin-containing solution and meticulous hemostasis obtained.  Wound was closed in 2 layers using interrupted inverted 3-0 Vicryl sutures.  The wound was dressed with dermabond, the head of bed at 30 degrees, taken to recovery room in stable condition.  No new postop neurological deficits were identified.  Sponge and pattie counts were correct at the end of the procedure.  Monitoring was stable throughout.  I performed the entire procedure with the assistance of Manning Charity PA as an Designer, television/film set.  Venetia Night MD

## 2021-07-08 NOTE — Transfer of Care (Signed)
Immediate Anesthesia Transfer of Care Note  Patient: Diana Green  Procedure(s) Performed: C3-4 ANTERIOR CERVICAL DECOMPRESSION/DISCECTOMY FUSION 1 LEVEL WITH HEDRON  Patient Location: PACU  Anesthesia Type:General  Level of Consciousness: awake, alert , oriented and drowsy  Airway & Oxygen Therapy: Patient Spontanous Breathing and Patient connected to face mask oxygen  Post-op Assessment: Report given to RN and Post -op Vital signs reviewed and stable  Post vital signs: Reviewed and stable  Last Vitals:  Vitals Value Taken Time  BP 171/83 07/08/21 0946  Temp    Pulse 65 07/08/21 0950  Resp 17 07/08/21 0950  SpO2 100 % 07/08/21 0950  Vitals shown include unvalidated device data.  Last Pain:  Vitals:   07/08/21 0622  TempSrc: Temporal  PainSc: 8          Complications: No notable events documented.
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
## 2021-07-08 NOTE — Anesthesia Procedure Notes (Signed)
Procedure Name: Intubation Date/Time: 07/08/2021 7:44 AM Performed by: Reece Agar, CRNA Pre-anesthesia Checklist: Patient identified, Emergency Drugs available, Suction available and Patient being monitored Patient Re-evaluated:Patient Re-evaluated prior to induction Oxygen Delivery Method: Circle system utilized Preoxygenation: Pre-oxygenation with 100% oxygen Induction Type: IV induction Ventilation: Mask ventilation without difficulty Laryngoscope Size: McGraph and 3 Tube type: Oral Tube size: 7.0 mm Number of attempts: 1 Airway Equipment and Method: Stylet and Oral airway Placement Confirmation: ETT inserted through vocal cords under direct vision, positive ETCO2 and breath sounds checked- equal and bilateral Secured at: 20 cm Tube secured with: Tape Dental Injury: Teeth and Oropharynx as per pre-operative assessment

## 2021-07-08 NOTE — Progress Notes (Signed)
Updating family regarding patient status and room request

## 2021-07-08 NOTE — Discharge Instructions (Addendum)
°Your surgeon has performed an operation on your cervical spine (neck) to relieve pressure on the spinal cord and/or nerves. This involved making an incision in the front of your neck and removing one or more of the discs that support your spine. Next, a small piece of bone, a titanium plate, and screws were used to fuse two or more of the vertebrae (bones) together. ° °The following are instructions to help in your recovery once you have been discharged from the hospital. Even if you feel well, it is important that you follow these activity guidelines. If you do not let your neck heal properly from the surgery, you can increase the chance of return of your symptoms and other complications. ° °* Do not take anti-inflammatory medications for 3 months after surgery (naproxen [Aleve], ibuprofen [Advil, Motrin].). These medications can prevent your bones from healing properly. ° °Activity  °  °No bending, lifting, or twisting (“BLT”). Avoid lifting objects heavier than 10 pounds (gallon milk jug).  Where possible, avoid household activities that involve lifting, bending, reaching, pushing, or pulling such as laundry, vacuuming, grocery shopping, and childcare. Try to arrange for help from friends and family for these activities while your back heals. ° °Increase physical activity slowly as tolerated.  Taking short walks is encouraged, but avoid strenuous exercise. Do not jog, run, bicycle, lift weights, or participate in any other exercises unless specifically allowed by your doctor. ° °Talk to your doctor before resuming sexual activity. ° °You should not drive until cleared by your doctor. ° °Until released by your doctor, you should not return to work or school.  You should rest at home and let your body heal.  ° °You may shower three days after your surgery.  After showering, lightly dab your incision dry. Do not take a tub bath or go swimming until approved by your doctor at your follow-up appointment. ° °If your  doctor ordered a cervical collar (neck brace) for you, you should wear it whenever you are out of bed. You may remove it when lying down or sleeping, but you should wear it at all other times. Not all neck surgeries require a cervical collar. ° °If you smoke, we strongly recommend that you quit.  Smoking has been proven to interfere with normal bone healing and will dramatically reduce the success rate of your surgery. Please contact [facility] (800-QUIT-NOW) and use the resources at www.[facility].com for assistance in stopping smoking. ° °Surgical Incision °  °If you have a dressing on your incision, you may remove it two days after your surgery. Keep your incision area clean and dry. ° °If you have staples or stitches on your incision, you should have a follow up scheduled for removal. If you do not have staples or stitches, you will have steri-strips (small pieces of surgical tape) or Dermabond glue. The steri-strips/glue should begin to peel away within about a week (it is fine if the steri-strips fall off before then). If the strips are still in place one week after your surgery, you may gently remove them. ° °Diet          ° °You may return to your usual diet. However, you may experience discomfort when swallowing in the first month after your surgery. This is normal. You may find that softer foods are more comfortable for you to swallow. Be sure to stay hydrated. ° °When to Contact Us ° °You may experience pain in your neck and/or pain between your shoulder blades. This is   normal and should improve in the next few weeks with the help of pain medication, muscle relaxers, and rest. Some patients report that a warm compress on the back of the neck or between the shoulder blades helps. ° °However, should you experience any of the following, contact us immediately: °New numbness or weakness °Pain that is progressively getting worse, and is not relieved by your pain medication, muscle relaxers, rest, and warm  compresses °Bleeding, redness, swelling, pain, or drainage from surgical incision °Chills or flu-like symptoms °Fever greater than 101.0 F (38.3 C) °Inability to eat, drink fluids, or take medications °Problems with bowel or bladder functions °Difficulty breathing or shortness of breath °Warmth, tenderness, or swelling in your calf °Contact Information °During office hours (Monday-Friday 9 am to 5 pm), please call your physician at [phone] and ask for Kendelyn Jean °After hours and weekends, please call [phone] and speak with the answering service, who will contact the doctor on call.  If that fails, call the [facility] Operator at [phone] and ask for the Neurosurgery Resident On Call  °For a life-threatening emergency, call 911 °  °

## 2021-07-08 NOTE — Anesthesia Preprocedure Evaluation (Signed)
Anesthesia Evaluation  Patient identified by MRN, date of birth, ID band Patient awake    Reviewed: Allergy & Precautions, H&P , NPO status , Patient's Chart, lab work & pertinent test results, reviewed documented beta blocker date and time   Airway Mallampati: III  TM Distance: >3 FB Neck ROM: Full    Dental  (+) Edentulous Lower, Lower Dentures, Partial Upper   Pulmonary neg pulmonary ROS,    Pulmonary exam normal        Cardiovascular hypertension, Pt. on medications and Pt. on home beta blockers negative cardio ROS Normal cardiovascular exam     Neuro/Psych PSYCHIATRIC DISORDERS Depression negative neurological ROS     GI/Hepatic negative GI ROS, Neg liver ROS,   Endo/Other  negative endocrine ROS  Renal/GU negative Renal ROS  negative genitourinary   Musculoskeletal  (+) Arthritis ,   Abdominal   Peds negative pediatric ROS (+)  Hematology negative hematology ROS (+)   Anesthesia Other Findings Arthritis    Depression    High cholesterol    Hypertension       Reproductive/Obstetrics negative OB ROS                            Anesthesia Physical Anesthesia Plan  ASA: 2  Anesthesia Plan: General   Post-op Pain Management:    Induction: Intravenous  PONV Risk Score and Plan: 3 and Propofol infusion, Midazolam and Ondansetron  Airway Management Planned: Oral ETT and Video Laryngoscope Planned  Additional Equipment:   Intra-op Plan:   Post-operative Plan: Extubation in OR  Informed Consent: I have reviewed the patients History and Physical, chart, labs and discussed the procedure including the risks, benefits and alternatives for the proposed anesthesia with the patient or authorized representative who has indicated his/her understanding and acceptance.     Dental advisory given  Plan Discussed with: CRNA, Anesthesiologist and Surgeon  Anesthesia Plan Comments:          Anesthesia Quick Evaluation

## 2021-07-08 NOTE — Progress Notes (Signed)
PHARMACY -  BRIEF ANTIBIOTIC NOTE   Pharmacy has received consult(s) for Cefazolin from an OR provider.  The patient's profile has been reviewed for ht/wt/allergies/indication/available labs.    One time order(s) placed for Cefazolin 3 gm pre-op per pt wt 122.5 kg.  Further antibiotics/pharmacy consults should be ordered by admitting physician if indicated.                       Thank you, Otelia Sergeant, PharmD, [facility] 07/08/2021 6:14 AM

## 2021-07-08 NOTE — Anesthesia Postprocedure Evaluation (Signed)
Anesthesia Post Note  Patient: Diana Green  Procedure(s) Performed: C3-4 ANTERIOR CERVICAL DECOMPRESSION/DISCECTOMY FUSION 1 LEVEL WITH HEDRON  Patient location during evaluation: PACU Anesthesia Type: General Level of consciousness: awake and alert, awake and oriented Pain management: pain level controlled Vital Signs Assessment: post-procedure vital signs reviewed and stable Respiratory status: spontaneous breathing, nonlabored ventilation and respiratory function stable Cardiovascular status: blood pressure returned to baseline and stable Postop Assessment: no apparent nausea or vomiting Anesthetic complications: no   No notable events documented.   Last Vitals:  Vitals:   07/08/21 1200 07/08/21 1300  BP: (!) 149/78 (!) 147/84  Pulse: 63 71  Resp: (!) 9 15  Temp:    SpO2: 97% 97%    Last Pain:  Vitals:   07/08/21 1100  TempSrc:   PainSc: Asleep                 Manfred Arch

## 2021-07-09 DIAGNOSIS — M5001 Cervical disc disorder with myelopathy,  high cervical region: Secondary | ICD-10-CM | POA: Diagnosis not present

## 2021-07-09 MED ORDER — OXYCODONE HCL 5 MG PO TABS: 5.0000 mg | ORAL_TABLET | ORAL | 0 refills | Status: DC | PRN

## 2021-07-09 MED ORDER — SODIUM CHLORIDE 0.9% FLUSH
10.0000 mL | Freq: Two times a day (BID) | INTRAVENOUS | Status: DC
  Administered 2021-07-09 – 2021-07-10 (×2): 10 mL via INTRAVENOUS

## 2021-07-09 NOTE — Evaluation (Signed)
Occupational Therapy Evaluation Patient Details Name: Diana Green MRN: [id number] DOB: [birth year] Today's Date: 07/09/2021   History of Present Illness Pt admitted to [facility] on 07/08/21 under observation for elective C3-4 cervical diskectomy and fusion. Significant PMH includes: cervical myelopathy, arthritis, depression, HLD, and HTN.   Clinical Impression     Pt is a 70 year old F admitted to hospital on 07/08/21 for elective C3-4 fusion. At baseline, pt is Ind with ADL's, IADL's, and limited ambulation with rollator. Pt this am in eval was Max sit< stand from bed and chair -and raised BSC was CG - toilet hygiene was CG no LOB. Pt with decrease bilateral UE AROM and strength and limting her functional performance in ADL's. Pt ed on use of AE for LB dressing and info provided where to order it from. SHe shows functional weakness, decreased activity tolerance, and safety Recommend acute skilled OT services to address deficits for return to baseline function safety at home  She lives alone - she states she wants to return hom but unclear is she has somebody at home for S and assistance. Her info kept changing. Did co-eval pt with PT and PT will return this afternoon to assess functional mobility and stairs with pt.  If pt continues to decline rehab or if doing better this afternoon ,  will recommend DC home with HHOT, 24/7 care, BSC, and RW.      Recommendations for follow up therapy are one component of a multi-disciplinary discharge planning process, led by the attending physician.  Recommendations may be updated based on patient status, additional functional criteria and insurance authorization.   Follow Up Recommendations  Home health OT    Assistance Recommended at Discharge Set up Supervision/Assistance  Functional Status Assessment     Equipment Recommendations  BSC/3in1    Recommendations for Other Services       Precautions / Restrictions Precautions Precautions:  Fall;Cervical Precaution Booklet Issued: No Restrictions Weight Bearing Restrictions: No      Mobility Bed Mobility Overal bed mobility: Needs Assistance Bed Mobility: Rolling;Sidelying to Sit Rolling: Supervision Sidelying to sit: Supervision       General bed mobility comments: supervision to perform log roll technique for supine>sit transfer; verbal cues for safety and sequencing; increased time/effort and reliance of UE support on bedrail    Transfers Overall transfer level: Needs assistance Equipment used: Rolling walker (2 wheels) Transfers: Sit to/from Stand Sit to Stand: Max assist           General transfer comment: from EOB      Balance Overall balance assessment: Needs assistance Sitting-balance support: No upper extremity supported;Feet supported Sitting balance-Leahy Scale: Fair Sitting balance - Comments: increased posterior lean with MMT   Standing balance support: During functional activity;Bilateral upper extremity supported Standing balance-Leahy Scale: Fair Standing balance comment: in RW                           ADL either performed or assessed with clinical judgement   ADL                                         General ADL Comments: Grooming and UB ADL's Independence with setup , LB dressing and spongebathing using AE - min A with setup - info provided for pt to order from [employer] - BSC recommended-  toilet transfers  S off BSC  and hygiene     Vision Baseline Vision/History: 1 Wears glasses Patient Visual Report: No change from baseline       Perception     Praxis      Pertinent Vitals/Pain Pain Assessment: Faces Pain Score: 1  Faces Pain Scale: Hurts a little bit Pain Location: pt would not say; believed to be at surgical incision Pain Intervention(s): Monitored during session     Hand Dominance Right   Extremity/Trunk Assessment Upper Extremity Assessment Upper Extremity Assessment: Generalized  weakness (bilateral UE shoulder AROM to 90 , elbow WNL and grip WFL  but decrease strength in grip , Froments in lat grip , opposition to 5th decrease strength)   Lower Extremity Assessment Lower Extremity Assessment: Defer to PT evaluation       Communication Communication Communication: No difficulties   Cognition Arousal/Alertness: Awake/alert Behavior During Therapy: WFL for tasks assessed/performed Overall Cognitive Status: Within Functional Limits for tasks assessed                                 General Comments: A&O x4; able to follow 100% of simple 2-step commands. Keep on changing her info on home sitations - how much help there will be and if family can stay with her     General Comments       Exercises Other Exercises Other Exercises: Toilet tranfers wtih BSC CG - hygiene supervision wtih RW Other Exercises: Ed on AE using reacher and sockaid to donn and doff socks and patns - info provided about AE   Shoulder Instructions      Home Living Family/patient expects to be discharged to:: Private residence Living Arrangements: Alone (But keep on changing info if son able to be with her 25hrs S) Available Help at Discharge: Family Type of Home: House Home Access: Stairs to enter Secretary/administrator of Steps: 3 Entrance Stairs-Rails: Can reach both Home Layout: One level     Bathroom Shower/Tub: Chief Strategy Officer: Standard Bathroom Accessibility: Yes   Home Equipment: Rollator (4 wheels);Shower seat          Prior Functioning/Environment Prior Level of Function : Independent/Modified Independent             Mobility Comments: Pt reports using rollator for limited ambulation ADLs Comments: Family assist with groceries        OT Problem List: Decreased strength;Decreased knowledge of use of DME or AE;Decreased range of motion;Decreased activity tolerance;Impaired balance (sitting and/or standing);Decreased safety  awareness;Impaired UE functional use      OT Treatment/Interventions: Self-care/ADL training;Therapeutic exercise;Patient/family education;Neuromuscular education;Balance training;DME and/or AE instruction    OT Goals(Current goals can be found in the care plan section) Acute Rehab OT Goals Patient Stated Goal: Want to go hom OT Goal Formulation: With patient Time For Goal Achievement: 07/16/21 Potential to Achieve Goals: Good  OT Frequency: Min 2X/week   Barriers to D/C:            Co-evaluation PT/OT/SLP Co-Evaluation/Treatment: Yes Reason for Co-Treatment: For patient/therapist safety;Complexity of the patient's impairments (multi-system involvement);To address functional/ADL transfers PT goals addressed during session: Mobility/safety with mobility;Balance OT goals addressed during session: ADL's and self-care;Proper use of Adaptive equipment and DME      AM-PAC OT "6 Clicks" Daily Activity     Outcome Measure Help from another person eating meals?: A Little Help from another person taking care of personal grooming?: A  Little Help from another person toileting, which includes using toliet, bedpan, or urinal?: A Little Help from another person bathing (including washing, rinsing, drying)?: A Little Help from another person to put on and taking off regular upper body clothing?: A Little Help from another person to put on and taking off regular lower body clothing?: A Lot 6 Click Score: 17   End of Session Equipment Utilized During Treatment: Gait belt Nurse Communication: Mobility status  Activity Tolerance: Patient tolerated treatment well Patient left: in chair;with call bell/phone within reach;with chair alarm set  OT Visit Diagnosis: Muscle weakness (generalized) (M62.81)                Time: 1003-1030 OT Time Calculation (min): 27 min Charges:  OT General Charges $OT Visit: 1 Visit OT Treatments $Self Care/Home Management : 8-22 mins    Primo Innis  OTR/L,CLT 07/09/2021, 3:30 PM

## 2021-07-09 NOTE — TOC Initial Note (Addendum)
Transition of Care [facility]) - Initial/Assessment Note    Patient Details  Name: Diana Green MRN: [id number] Date of Birth: April 19, 1951  Transition of Care [facility]) CM/SW Contact:    Liliana Cline, LCSW Phone Number: 07/09/2021, 4:06 PM  Clinical Narrative:                 Spoke with patient regarding DC planning.  Patient lives alone and uses [employer] transportation. PCP is Dr. Welton Flakes. Pharmacy is [facility].  Patient reported she is aware of PT rec for SNF and 24/7 supervision. Patient is declining SNF. Patient says she has spoken with her son Diana "Leonette Most" Su Green who has agreed to stay with her for a week following DC and provide supervision.  Patient is agreeable to HHPT, no agency preference. [facility] and [facility] unable to accept. Asked [facility], [facility]- waiting to hear back.  Patient is agreeable to RW and 3 in 1 being ordered for her. Referral made to [facility] with [facility]. Asked MD for DME orders. Patient stated she lives at [address] [place], [place]. Confirmed patients phone #.   Expected Discharge Plan: Home w Home Health Services Barriers to Discharge: Continued Medical Work up   Patient Goals and CMS Choice Patient states their goals for this hospitalization and ongoing recovery are:: home with home health, refuses SNF CMS Medicare.gov Compare Post Acute Care list provided to:: Patient Choice offered to / list presented to : Patient  Expected Discharge Plan and Services Expected Discharge Plan: Home w Home Health Services       Living arrangements for the past 2 months: Single Family Home                 DME Arranged: 3-N-1, Walker rolling DME Agency: [facility] Date DME Agency Contacted: 07/09/21   Representative spoke with at DME Agency: Leavy Cella            Prior Living Arrangements/Services Living arrangements for the past 2 months: Single Family Home Lives with:: Self Patient language and need for interpreter reviewed::  Yes Do you feel safe going back to the place where you live?: Yes      Need for Family Participation in Patient Care: Yes (Comment) Care giver support system in place?: Yes (comment)   Criminal Activity/Legal Involvement Pertinent to Current Situation/Hospitalization: No - Comment as needed  Activities of Daily Living      Permission Sought/Granted Permission sought to share information with : Facility Medical sales representative, Family Supports Permission granted to share information with : Yes, Verbal Permission Granted  Share Information with NAME: Diana Green - son  Permission granted to share info w AGENCY: HH, DME agencies        Emotional Assessment       Orientation: : Oriented to Self, Oriented to Place, Oriented to  Time, Oriented to Situation Alcohol / Substance Use: Not Applicable Psych Involvement: No (comment)  Admission diagnosis:  Cervical myelopathy (HCC) [G95.9] Patient Active Problem List   Diagnosis Date Noted   Cervical myelopathy (HCC) 07/08/2021   PCP:  Lyndon Code, MD Pharmacy:   [facility] [address] [place] [address] [place]) [place] [address] Phone: [phone] Fax: [phone]     Social Determinants of Health (SDOH) Interventions    Readmission Risk Interventions No flowsheet data found.

## 2021-07-09 NOTE — TOC Progression Note (Signed)
Transition of Care [facility]) - Progression Note    Patient Details  Name: Diana Green MRN: [id number] Date of Birth: 28-Oct-1950  Transition of Care [facility]) CM/SW Contact  Liliana Cline, LCSW Phone Number: 07/09/2021, 4:21 PM  Clinical Narrative:   Call from [place] at [employer]. Per [place], patient got a 3 in 1 and RW last year so would not qualify for another through insurance. Patient can choose to pay out of pocket for about $90-95 if she would like new ones.     Expected Discharge Plan: Home w Home Health Services Barriers to Discharge: Continued Medical Work up  Expected Discharge Plan and Services Expected Discharge Plan: Home w Home Health Services       Living arrangements for the past 2 months: Single Family Home                 DME Arranged: 3-N-1, Walker rolling DME Agency: [facility] Date DME Agency Contacted: 07/09/21   Representative spoke with at DME Agency: Leavy Cella             Social Determinants of Health (SDOH) Interventions    Readmission Risk Interventions No flowsheet data found.

## 2021-07-09 NOTE — Progress Notes (Signed)
    Attending Progress Note  History: Diana Green is here for cervical myelopathy  Physical Exam: Vitals:   07/09/21 0551 07/09/21 0749  BP: (!) 117/59 (!) 101/55  Pulse: 68 64  Resp: 17 17  Temp: (!) 97.4 F (36.3 C) 98.8 F (37.1 C)  SpO2: 95% 100%    AA Ox3 CNI  Strength:5/5 throughout BUE Neck soft. Incision c/d/i  Data:  No results for input(s): NA, K, CL, CO2, BUN, CREATININE, LABGLOM, GLUCOSE, CALCIUM in the last 168 hours. No results for input(s): AST, ALT, ALKPHOS in the last 168 hours.  Invalid input(s): TBILI   No results for input(s): WBC, HGB, HCT, PLT in the last 168 hours. No results for input(s): APTT, INR in the last 168 hours.       Other tests/results: n/a  Assessment/Plan:  Diana Green is doing well on POD1.    - mobilize - pain control - DVT prophylaxis - PTOT   Venetia Night MD, [facility] Department of Neurosurgery

## 2021-07-09 NOTE — Discharge Summary (Addendum)
Physician Discharge Summary  Patient ID: Diana BHAT MRN: [id number] DOB/AGE: 1951/08/15 70 y.o.  Admit date: 07/08/2021 Discharge date: 07/10/2021  Admission Diagnoses: cervical myelopathy  Discharge Diagnoses:  Principal Problem:   Cervical myelopathy [facility])   Discharged Condition: good  Hospital Course: [name] is a 70 yo female who presented with cervical myelopathy.  She did well with surgery with improved arm strength.  She worked with PTOT and was felt to be stable for discharge.  Consults: None  Significant Diagnostic Studies: radiology: X-Ray: good localization  Treatments: surgery: C3-4 ACDF  Discharge Exam: Blood pressure (!) 145/85, pulse (!) 59, temperature 97.6 F (36.4 C), resp. rate 14, height 5\' 9"  (1.753 m), weight 122.5 kg, SpO2 100 %. General appearance: alert and cooperative CNI 5/5 throughout  Disposition: Discharge disposition: 01-Home or Self Care      Discharge Instructions     Incentive spirometry RT   Complete by: As directed       Allergies as of 07/10/2021   No Known Allergies      Medication List     TAKE these medications    atenolol 100 MG tablet Commonly known as: TENORMIN Take 100 mg by mouth daily.   celecoxib 200 MG capsule Commonly known as: CELEBREX Take 200 mg by mouth 2 (two) times daily.   DULoxetine 60 MG capsule Commonly known as: CYMBALTA Take 60 mg by mouth daily.   gabapentin 600 MG tablet Commonly known as: NEURONTIN Take 600 mg by mouth 2 (two) times daily.   hydrochlorothiazide 25 MG tablet Commonly known as: HYDRODIURIL Take 25 mg by mouth daily.   Lidocaine 4 % Ptch Place 1 patch onto the skin daily as needed (back pain).   losartan 100 MG tablet Commonly known as: COZAAR Take 100 mg by mouth at bedtime.   oxyCODONE 5 MG immediate release tablet Commonly known as: Oxy IR/ROXICODONE Take 1 tablet (5 mg total) by mouth every 4 (four) hours as needed for moderate pain ((score 4 to  6)).   simvastatin 20 MG tablet Commonly known as: ZOCOR Take 20 mg by mouth at bedtime.   tiZANidine 4 MG tablet Commonly known as: ZANAFLEX Take 4 mg by mouth 2 (two) times daily.   VITAMIN D3 PO Take 1 tablet by mouth once a week. Monday               Durable Medical Equipment  (From admission, onward)           Start     Ordered   07/09/21 1629  For home use only DME Walker rolling  Once       Question Answer Comment  Walker: With 5 Inch Wheels   Patient needs a walker to treat with the following condition Cervical myelopathy (HCC)      07/09/21 1630            Follow-up Information     07/11/21, PA Follow up in 2 week(s).   Why: For incision check. This appointment should already be scheduled at the [facility]. Please call with any questions or concerns regarding appointment date or time. Contact information: [address] [place] [phone]                 Signed: [phone] 07/10/2021, 9:39 AM

## 2021-07-09 NOTE — Progress Notes (Signed)
Physical Therapy Treatment Patient Details Name: Diana Green MRN: [id number] DOB: [birth year] Today's Date: 07/09/2021   History of Present Illness Pt admitted to [facility] on 07/08/21 under observation for elective C3-4 cervical diskectomy and fusion. Significant PMH includes: cervical myelopathy, arthritis, depression, HLD, and HTN.    PT Comments    Pt tolerated treatment well this afternoon, and was able to improve overall ambulation distance, assist levels, activity tolerance, seated balance, and safety awareness from IE session this morning. Pt required supervision for safety with bed mobility, min-max assist for transfers with RW, CGA for safety with ambulation, and CGA for safety with stair training. Pt continues to require increased assist for STS transfers due to functional weakness, especially with lower seat heights. However, pt states that family members are currently working on getting her a Metallurgist. Although pt was CGA for stair training, she required increased time/effort and reliance of UE support on handrail to safely navigate steps and demonstrated decreased foot clearance both ascending and descending, which increase her risk of falls. Gait deficits continue to present as an increased fall risk, and gait deficits noted to increase with fatigue. Despite progress, pt continues to be limited with meeting goals secondary to functional weakness, decreased activity tolerance, poor safety awareness, impaired insight into deficits, and decreased standing balance. Pt will continue to benefit from skilled acute PT services to address deficits for return to baseline function. Pt stating that son has confirmed that he will be able to provide 24/7 care, for a week following DC. However, unknown accuracy as pt has stated conflicting reports about assist at home. Will continue to recommend SNF at DC. However, if pt continues to decline SNF recommendation, and son is able to provide 24/7 care for a  week at DC, will recommend HHPT with 24/7 care. Pt will also benefit from [facility] and RW for energy conservation and safety with functional mobility/ADL's.      Recommendations for follow up therapy are one component of a multi-disciplinary discharge planning process, led by the attending physician.  Recommendations may be updated based on patient status, additional functional criteria and insurance authorization.  Follow Up Recommendations  Skilled nursing-short term rehab (<3 hours/day) (if pt declines, will recommend HHPT w/ 24/7 care)     Assistance Recommended at Discharge  (if pt declines SNF recommendation, will recommend 24/7 care at home)  Equipment Recommendations  BSC/3in1;Rolling walker (2 wheels)       Precautions / Restrictions Precautions Precautions: Fall;Cervical Precaution Booklet Issued: No Restrictions Weight Bearing Restrictions: No     Mobility  Bed Mobility Overal bed mobility: Needs Assistance Bed Mobility: Sit to Supine Rolling: Supervision Sidelying to sit: Supervision   Sit to supine: Supervision   General bed mobility comments: for safety to lie supine; increased time/effort for BLE facilitation onto bed    Transfers Overall transfer level: Needs assistance Equipment used: Rolling walker (2 wheels) Transfers: Sit to/from Stand Sit to Stand: Max assist;Min assist;Mod assist           General transfer comment: Max assist from recliner with RW; demonstrates posterior LOB requiring assist for correction. Min assist from [facility] with RW. Mod assist from recliner with RW (2nd attempt). Verbal cues for safety, sequencing, and hand/BLE placement. Increased time/effort to achieve full upright standing.    Ambulation/Gait Ambulation/Gait assistance: Min guard Gait Distance (Feet): 135 Feet (48ft x1, 121ft x1, 29ft x1) Assistive device: Rolling walker (2 wheels)         General Gait Details:  CGA for safety to ambulate multiple short bouts with RW.  Demonstrates slowed cadence, early reciprocal gait, decreased step length/foot clearance bil (R>L), bil shoulder hiking, increased UE reliance on RW, and impaired RW negotiation. Verbal cues to relax shoulders, stand tall, and for R foot clearance.   Stairs Stairs: Yes Stairs assistance: Min guard Stair Management: Two rails;Step to pattern   General stair comments: CGA for safety with step to pattern, ascending with L and descending with R. Max assist from BUE support  on railing ascending. Increased time/effort.     Balance Overall balance assessment: Needs assistance Sitting-balance support: No upper extremity supported;Feet supported Sitting balance-Leahy Scale: Good Sitting balance - Comments: increased posterior lean with MMT   Standing balance support: During functional activity;Bilateral upper extremity supported Standing balance-Leahy Scale: Fair Standing balance comment: in RW                            Cognition Arousal/Alertness: Awake/alert Behavior During Therapy: WFL for tasks assessed/performed Overall Cognitive Status: Within Functional Limits for tasks assessed                                 General Comments: Able to follow 100% of simple 2-step commands. Confirmed that son will be available 24/7 for a week at DC        Exercises Other Exercises Other Exercises: Participated in bed mobility, transfers, gait, stair training, and toileting. Increased assist for STS transfers due to functional weakness. Other Exercises: Pt educated re: PT role/POC, DC recommendations, safety with mobility, cervical precautions, current functional limitations. She verbalized understanding.        Pertinent Vitals/Pain Pain Assessment: No/denies pain Pain Score: 1  Pain Location: pt would not say; believed to be at surgical incision    Home Living Family/patient expects to be discharged to:: Private residence Living Arrangements: Alone (But keep on  changing info if son able to be with her 25hrs S) Available Help at Discharge: Family Type of Home: House Home Access: Stairs to enter Entrance Stairs-Rails: Can reach both     Home Layout: One level Home Equipment: Rollator (4 wheels);Shower seat          PT Goals (current goals can now be found in the care plan section) Acute Rehab PT Goals Patient Stated Goal: "go home" PT Goal Formulation: With patient Time For Goal Achievement: 07/23/21 Potential to Achieve Goals: Fair Progress towards PT goals: Progressing toward goals    Frequency    7X/week      PT Plan Current plan remains appropriate    Co-evaluation   Reason for Co-Treatment: For patient/therapist safety;Complexity of the patient's impairments (multi-system involvement);To address functional/ADL transfers   OT goals addressed during session: ADL's and self-care;Proper use of Adaptive equipment and DME      AM-PAC PT "6 Clicks" Mobility   Outcome Measure  Help needed turning from your back to your side while in a flat bed without using bedrails?: A Little Help needed moving from lying on your back to sitting on the side of a flat bed without using bedrails?: A Little Help needed moving to and from a bed to a chair (including a wheelchair)?: A Little Help needed standing up from a chair using your arms (e.g., wheelchair or bedside chair)?: A Lot Help needed to walk in hospital room?: A Little Help needed climbing 3-5 steps with a  railing? : A Little 6 Click Score: 17    End of Session Equipment Utilized During Treatment: Gait belt Activity Tolerance: Patient tolerated treatment well Patient left: in bed;with bed alarm set (Bed in chair position) Nurse Communication: Mobility status PT Visit Diagnosis: Unsteadiness on feet (R26.81);Muscle weakness (generalized) (M62.81);Difficulty in walking, not elsewhere classified (R26.2);Pain Pain - part of body:  (neck)     Time: 1411-1436 PT Time Calculation  (min) (ACUTE ONLY): 25 min  Charges:  $Therapeutic Activity: 23-37 mins                      Vira Blanco, PT, DPT 3:52 PM,07/09/21

## 2021-07-09 NOTE — Plan of Care (Signed)

## 2021-07-09 NOTE — Evaluation (Signed)
Physical Therapy Evaluation Patient Details Name: Diana Green MRN: [id number] DOB: 14-Sep-1950 Today's Date: 07/09/2021  History of Present Illness  Pt admitted to [facility] on 07/08/21 under observation for elective C3-4 cervical diskectomy and fusion. Significant PMH includes: cervical myelopathy, arthritis, depression, HLD, and HTN.   Clinical Impression  Pt is a 70 year old F admitted to hospital on 07/08/21 for elective C3-4 fusion. At baseline, pt is Ind with ADL's, IADL's, and limited ambulation with rollator. Pt presents with functional weakness, decreased activity tolerance, decreased insight into deficits, decreased balance, decreased knowledge of precautions, paresthesias, and increased pain levels, resulting in impaired functional mobility. Due to deficits, pt required supervision for bed mobility, max assist for tranfers with RW, and CGA for safety with gait. Gait deviations, decreased activity tolerance, and decreased standing balance increase the pt's risk of falls. Deficits limit the pt's ability to safely and independently perform ADL's, transfer, and ambulate. Pt will benefit from acute skilled PT services to address deficits for return to baseline function. Plan to see patient this PM for progression and mobility and improved independence for safe DC. At this time, PT recommends SNF at DC to address deficits and improve overall safety with functional mobility prior to return home. Pt currently declining SNF recommendation, stating that she wishes to go home. Pt with unknown support at home, as she has not been clear about level of assist son can currently provide. If pt continues to decline, will recommend DC home with HHPT, 24/7 care, BSC, and RW for energy conservation and safety with mobility. Pt agreeable with max education.        Recommendations for follow up therapy are one component of a multi-disciplinary discharge planning process, led by the attending physician.  Recommendations  may be updated based on patient status, additional functional criteria and insurance authorization.  Follow Up Recommendations Skilled nursing-short term rehab (<3 hours/day) (if pt declines, will recommend HHPT w/ 24/7 care)    Assistance Recommended at Discharge  (if pt declines SNF recommendation, will recommend 24/7 care at home)  Functional Status Assessment Patient has had a recent decline in their functional status and demonstrates the ability to make significant improvements in function in a reasonable and predictable amount of time.  Equipment Recommendations  BSC/3in1    Recommendations for Other Services       Precautions / Restrictions Precautions Precautions: Fall;Cervical Precaution Booklet Issued: No Restrictions Weight Bearing Restrictions: No      Mobility  Bed Mobility Overal bed mobility: Needs Assistance Bed Mobility: Rolling;Sidelying to Sit Rolling: Supervision Sidelying to sit: Supervision       General bed mobility comments: supervision to perform log roll technique for supine>sit transfer; verbal cues for safety and sequencing; increased time/effort and reliance of UE support on bedrail    Transfers Overall transfer level: Needs assistance Equipment used: Rolling walker (2 wheels) Transfers: Sit to/from Stand Sit to Stand: Max assist           General transfer comment: for power to stand from EOB with RW; verbal cues for hand placement, safety, and sequencing; increased time/effort required to achieve full upright standing    Ambulation/Gait Ambulation/Gait assistance: Min guard Gait Distance (Feet): 46 Feet (41ft x1, 94ft x1) Assistive device: Rolling walker (2 wheels)         General Gait Details: CGA for safety to ambulate multiple short bouts with RW. Demonstrates slowed cadence, step to gait pattern, decreased step length/foot clearance bil (R>L), bil shoulder hiking, and impaired  RW negotiation. Verbal cues to relax shoulders, stand  tall, and for R foot clearance.     Balance Overall balance assessment: Needs assistance Sitting-balance support: No upper extremity supported;Feet supported Sitting balance-Leahy Scale: Fair Sitting balance - Comments: increased posterior lean with MMT   Standing balance support: During functional activity;Bilateral upper extremity supported Standing balance-Leahy Scale: Fair Standing balance comment: in RW                             Pertinent Vitals/Pain Pain Assessment: Faces Faces Pain Scale: Hurts a little bit Pain Location: pt would not say; believed to be at surgical incision Pain Intervention(s): Monitored during session    Home Living Family/patient expects to be discharged to:: Private residence Living Arrangements: Alone (initially reporting living alone, then reporting that someone is there with her; couldn't get a clear answer) Available Help at Discharge: Family (states son lives down the street and does not work; states he's able to physically assist but would not give a clear answer as to if the son could be available 24/7 at DC) Type of Home: House Home Access: Stairs to enter Entrance Stairs-Rails: Can reach both Entrance Stairs-Number of Steps: 3   Home Layout: One level Home Equipment: Rollator (4 wheels);Shower seat      Prior Function Prior Level of Function : Independent/Modified Independent             Mobility Comments: Pt reports using rollator for limited ambulation ADLs Comments: Pt reports being Ind with ADL's and IADL's; states that family assist with groceries        Extremity/Trunk Assessment   Upper Extremity Assessment Upper Extremity Assessment: Defer to OT evaluation    Lower Extremity Assessment Lower Extremity Assessment: Generalized weakness (Grossly 4+/5 but functionally weak, requiring max assist for STS transfers; coordination intact; diminished sensation LLE L4-5)          Cognition Arousal/Alertness:  Awake/alert Behavior During Therapy: WFL for tasks assessed/performed Overall Cognitive Status: Within Functional Limits for tasks assessed                                 General Comments: A&O x4; able to follow 100% of simple 2-step commands. Not exactly forthcoming regarding assist levels at home and pain           Exercises Other Exercises Other Exercises: Participated in bed mobility, transfers, gait, and toileting. Increased assist for STS transfers due to functional weakness. Other Exercises: Pt educated re: PT role/POC, DC recommendations, safety with mobility, cervical precautions, current functional limitations. She verbalized understanding.   Assessment/Plan    PT Assessment Patient needs continued PT services  PT Problem List Decreased strength;Decreased activity tolerance;Decreased balance;Decreased mobility;Decreased safety awareness;Impaired sensation;Obesity;Pain       PT Treatment Interventions DME instruction;Gait training;Stair training;Functional mobility training;Therapeutic activities;Therapeutic exercise;Balance training;Neuromuscular re-education    PT Goals (Current goals can be found in the Care Plan section)  Acute Rehab PT Goals Patient Stated Goal: "go home" PT Goal Formulation: With patient Time For Goal Achievement: 07/23/21 Potential to Achieve Goals: Fair    Frequency 7X/week   Barriers to discharge Decreased caregiver support      Co-evaluation PT/OT/SLP Co-Evaluation/Treatment: Yes Reason for Co-Treatment: Complexity of the patient's impairments (multi-system involvement);To address functional/ADL transfers PT goals addressed during session: Mobility/safety with mobility;Balance OT goals addressed during session: ADL's and self-care;Proper use of Adaptive equipment  and DME       AM-PAC PT "6 Clicks" Mobility  Outcome Measure Help needed turning from your back to your side while in a flat bed without using bedrails?: A  Little Help needed moving from lying on your back to sitting on the side of a flat bed without using bedrails?: A Little Help needed moving to and from a bed to a chair (including a wheelchair)?: A Little Help needed standing up from a chair using your arms (e.g., wheelchair or bedside chair)?: A Lot Help needed to walk in hospital room?: A Little Help needed climbing 3-5 steps with a railing? : A Lot 6 Click Score: 16    End of Session Equipment Utilized During Treatment: Gait belt Activity Tolerance: Patient tolerated treatment well Patient left: in chair (left in care of OT) Nurse Communication: Mobility status PT Visit Diagnosis: Unsteadiness on feet (R26.81);Muscle weakness (generalized) (M62.81);Difficulty in walking, not elsewhere classified (R26.2);Pain Pain - part of body:  (neck)    Time: 6720-9470 PT Time Calculation (min) (ACUTE ONLY): 28 min   Charges:   PT Evaluation $PT Eval Low Complexity: 1 Low PT Treatments $Therapeutic Activity: 8-22 mins        Vira Blanco, PT, DPT 12:00 PM,07/09/21

## 2021-07-10 DIAGNOSIS — M5001 Cervical disc disorder with myelopathy,  high cervical region: Secondary | ICD-10-CM | POA: Diagnosis not present

## 2021-07-10 NOTE — Progress Notes (Signed)
Physical Therapy Treatment Patient Details Name: Diana Green MRN: [id number] DOB: Oct 23, 1950 Today's Date: 07/10/2021   History of Present Illness Pt admitted to [facility] on 07/08/21 under observation for elective C3-4 cervical diskectomy and fusion. Significant PMH includes: cervical myelopathy, arthritis, depression, HLD, and HTN.    PT Comments    Patient with increased independence with functional activity this session. Patient still needs occasional cues for safety with activity but is able to maintain general spine precautions with functional activity.  She ambulated with rolling walker with no loss of balance, and increased ambulation distance and overall activity tolerance compared to yesterday. Patient performed standing and sitting functional activity without loss of balance.  Patient is anticipated to discharge later today. Recommend HHPT with intermittent family support as needed.    Recommendations for follow up therapy are one component of a multi-disciplinary discharge planning process, led by the attending physician.  Recommendations may be updated based on patient status, additional functional criteria and insurance authorization.  Follow Up Recommendations  Home health PT     Assistance Recommended at Discharge Intermittent Supervision/Assistance  Equipment Recommendations  BSC/3in1;Rolling walker (2 wheels)    Recommendations for Other Services       Precautions / Restrictions Precautions Precautions: Fall;Cervical Precaution Booklet Issued: No Restrictions Weight Bearing Restrictions: No     Mobility  Bed Mobility Overal bed mobility: Needs Assistance Bed Mobility: Sit to Supine       Sit to supine: Supervision;HOB elevated   General bed mobility comments: with use of bed rails. verbal cues for technique and sequencing. no physical assistance required. increased time and effort required    Transfers Overall transfer level: Needs assistance Equipment  used: Rolling walker (2 wheels) Transfers: Sit to/from Stand Sit to Stand: From elevated surface;Min guard           General transfer comment: Min guard with cues for hamd placement for safety. 2 bouts of standing performed    Ambulation/Gait Ambulation/Gait assistance: Min guard;Supervision Gait Distance (Feet): 150 Feet Assistive device: Rolling walker (2 wheels) Gait Pattern/deviations: Step-through pattern;Decreased stride length Gait velocity: decreased     General Gait Details: verbal cues to keep rolling walker close to base of support. no loss of balance with ambulation. patient educated on routine walking at home and to use rolling walker for safety and fall prevention   Stairs             Wheelchair Mobility    Modified Rankin (Stroke Patients Only)       Balance Overall balance assessment: Needs assistance Sitting-balance support: No upper extremity supported;Feet supported Sitting balance-Leahy Scale: Good Sitting balance - Comments: no loss of balance when challenged outside base of support with functional activity, donning clothing   Standing balance support: During functional activity;Single extremity supported Standing balance-Leahy Scale: Good Standing balance comment: patient able to maintain standing balance with reaching outside base of support for sponge bathing/ peri-care at the sink.                            Cognition Arousal/Alertness: Awake/alert Behavior During Therapy: WFL for tasks assessed/performed Overall Cognitive Status: Within Functional Limits for tasks assessed                                 General Comments: patient able to follow all commands without difficulty  Exercises      General Comments General comments (skin integrity, edema, etc.): educated patient on general spine precuations and gave tips on how to maintain during functional activity such as getting dressed. patient  demonstrated understanding with functional activity      Pertinent Vitals/Pain Pain Assessment: No/denies pain    Home Living                          Prior Function            PT Goals (current goals can now be found in the care plan section) Acute Rehab PT Goals Patient Stated Goal: to go home PT Goal Formulation: With patient Time For Goal Achievement: 07/23/21 Potential to Achieve Goals: Good Progress towards PT goals: Progressing toward goals    Frequency           PT Plan Current plan remains appropriate    Co-evaluation              AM-PAC PT "6 Clicks" Mobility   Outcome Measure  Help needed turning from your back to your side while in a flat bed without using bedrails?: A Little Help needed moving from lying on your back to sitting on the side of a flat bed without using bedrails?: A Little Help needed moving to and from a bed to a chair (including a wheelchair)?: A Little Help needed standing up from a chair using your arms (e.g., wheelchair or bedside chair)?: A Little Help needed to walk in hospital room?: A Little Help needed climbing 3-5 steps with a railing? : A Little 6 Click Score: 18    End of Session Equipment Utilized During Treatment: Gait belt Activity Tolerance: Patient tolerated treatment well Patient left: in chair;with call bell/phone within reach   PT Visit Diagnosis: Unsteadiness on feet (R26.81);Muscle weakness (generalized) (M62.81);Difficulty in walking, not elsewhere classified (R26.2);Pain     Time: 9326-7124 PT Time Calculation (min) (ACUTE ONLY): 60 min  Charges:  $Gait Training: 8-22 mins $Therapeutic Activity: 38-52 mins                     Donna Bernard, PT, MPT    Ina Homes 07/10/2021, 1:06 PM

## 2021-07-10 NOTE — TOC Progression Note (Signed)
Transition of Care [facility]) - Progression Note    Patient Details  Name: Diana Green MRN: [id number] Date of Birth: 11/08/1950  Transition of [facility]) CM/SW Contact  Liliana Cline, LCSW Phone Number: 07/10/2021, 9:30 AM  Clinical Narrative:   [facility], [facility], [facility], and [facility] are unable to accept patient's insurance. Did not receive a response from [place], [place], or [facility].   Spoke to patient. Patient verbalized understanding and is still refusing SNF. Patient is agreeable to OPPT. Referral faxed to [facility].  Patient stated she still has the RW and 3 in 1 that was provided last year and can use those. Does not wish to private pay for new ones.  Expected Discharge Plan: Home w Home Health Services Barriers to Discharge: Continued Medical Work up  Expected Discharge Plan and Services Expected Discharge Plan: Home w Home Health Services       Living arrangements for the past 2 months: Single Family Home                 DME Arranged: 3-N-1, Walker rolling DME Agency: [employer] Date DME Agency Contacted: 07/09/21   Representative spoke with at DME Agency: Leavy Cella             Social Determinants of Health (SDOH) Interventions    Readmission Risk Interventions No flowsheet data found.

## 2021-07-10 NOTE — Plan of Care (Signed)
Patient discharged per MD orders at this time.All discharge instructions, education and medications reviewed with patient at the bedside.Pt expressed understanding and will comply with dc instructions.follow up appointments was also communicated to patient.no verbal c/o or any ssx of distress.Patient discharged to outpatient PT/OT services per order.patient was transported home by son in a privately owned vehicle.

## 2021-07-10 NOTE — Progress Notes (Signed)
    Attending Progress Note  History: ABBRIELLE BATTS is here for cervical myelopathy  POD2: Doing well. Worked with PT yesterday.  Continues to feel stronger.  Has a sore throat but is swallowing fine.  Physical Exam: Vitals:   07/10/21 0327 07/10/21 0804  BP: 129/67 (!) 145/85  Pulse: 61 (!) 59  Resp: 17 14  Temp: 97.7 F (36.5 C) 97.6 F (36.4 C)  SpO2: 98% 100%    AA Ox3 CNI  Strength:5/5 throughout BUE Neck soft. Incision c/d/i  Data:  No results for input(s): NA, K, CL, CO2, BUN, CREATININE, LABGLOM, GLUCOSE, CALCIUM in the last 168 hours. No results for input(s): AST, ALT, ALKPHOS in the last 168 hours.  Invalid input(s): TBILI   No results for input(s): WBC, HGB, HCT, PLT in the last 168 hours. No results for input(s): APTT, INR in the last 168 hours.       Other tests/results: n/a  Assessment/Plan:  MICHALA DEBLANC is doing well on POD2.    - mobilize - pain control - DVT prophylaxis - PTOT   Venetia Night MD, [facility] Department of Neurosurgery

## 2021-07-10 NOTE — Progress Notes (Signed)
Occupational Therapy * Physical Therapy * Speech Therapy                         DATE 07/10/21       PATIENT NAME Diana Green   PATIENT MRN [id number]       DIAGNOSIS/DIAGNOSIS CODE G95.9  DATE OF DISCHARGE 07/10/21       PRIMARY CARE PHYSICIAN PHONE/FAX [phone]      Dear Provider Name: [facility] OUTPATIENT PT/OT  I certify that I have examined this patient and that occupational/physical/speech therapy is necessary on an outpatient basis.    The patient has expressed interest in completing their recommended course of therapy at your  location.  Once a formal order from the patient's primary care physician has been obtained, please  contact him/her to schedule an appointment for evaluation at your earliest convenience.   [ X ]  Physical Therapy Evaluate and Treat          [ X ]  Occupational Therapy Evaluate and Treat                                                             [  ]  Speech Therapy Evaluate and Treat   The patient's primary care physician (listed above) must furnish and be responsible for a formal order such that the recommended services may be furnished while under the primary physician's care, and that the plan of care will be established and reviewed every 30 days (or more often if condition necessitates).

## 2021-07-11 ENCOUNTER — Encounter: Payer: Self-pay | Admitting: Neurosurgery

## 2021-08-19 ENCOUNTER — Other Ambulatory Visit: Payer: Self-pay | Admitting: Neurosurgery

## 2021-08-19 DIAGNOSIS — Z01818 Encounter for other preprocedural examination: Secondary | ICD-10-CM

## 2021-09-05 ENCOUNTER — Inpatient Hospital Stay: Admission: RE | Admit: 2021-09-05 | Payer: Medicare (Managed Care) | Source: Ambulatory Visit

## 2021-09-08 ENCOUNTER — Encounter
Admission: RE | Admit: 2021-09-08 | Discharge: 2021-09-08 | Disposition: A | Discharge status: Home / Self Care | Payer: Medicare (Managed Care) | Source: Ambulatory Visit | Attending: Neurosurgery | Admitting: Neurosurgery

## 2021-09-08 ENCOUNTER — Other Ambulatory Visit: Payer: Self-pay

## 2021-09-08 DIAGNOSIS — Z01818 Encounter for other preprocedural examination: Secondary | ICD-10-CM

## 2021-09-08 DIAGNOSIS — Z79899 Other long term (current) drug therapy: Secondary | ICD-10-CM

## 2021-09-08 NOTE — Patient Instructions (Signed)
Your procedure is scheduled on:09-19-21 Monday Report to the Registration Desk on the 1st floor of the Medical Mall. Then proceed to the 2nd floor Surgery Desk in the Medical Mall To find out your arrival time, please call [phone] between 1PM - 3PM on:09-16-21 Friday  REMEMBER: Instructions that are not followed completely may result in serious medical risk, up to and including death; or upon the discretion of your surgeon and anesthesiologist your surgery may need to be rescheduled.  Do not eat food after midnight the night before surgery.  No gum chewing, lozengers or hard candies.  You may however, drink CLEAR liquids up to 2 hours before you are scheduled to arrive for your surgery. Do not drink anything within 2 hours of your scheduled arrival time.  Clear liquids include: - water  - apple juice without pulp - gatorade (not RED, PURPLE, OR BLUE) - black coffee or tea (Do NOT add milk or creamers to the coffee or tea) Do NOT drink anything that is not on this list.  TAKE THESE MEDICATIONS THE MORNING OF SURGERY WITH A SIP OF WATER: -atenolol (TENORMIN) -DULoxetine (CYMBALTA) -gabapentin (NEURONTIN)  One week prior to surgery: Stop Anti-inflammatories (NSAIDS) such as Advil, Aleve, Ibuprofen, Motrin, Naproxen, Naprosyn and Aspirin based products such as Excedrin, Goodys Powder, BC Powder.You may however, continue to take Tylenol if needed for pain up until the day of surgery. You may continue your celecoxib (CELEBREX) up until the day prior to surgery  Stop ANY OVER THE COUNTER supplements/vitamins 7 days prior to surgery (Vitamin B 12)  No Alcohol for 24 hours before or after surgery.  No Smoking including e-cigarettes for 24 hours prior to surgery.  No chewable tobacco products for at least 6 hours prior to surgery.  No nicotine patches on the day of surgery.  Do not use any "recreational" drugs for at least a week prior to your surgery.  Please be advised that the  combination of cocaine and anesthesia may have negative outcomes, up to and including death. If you test positive for cocaine, your surgery will be cancelled.  On the morning of surgery brush your teeth with toothpaste and water, you may rinse your mouth with mouthwash if you wish. Do not swallow any toothpaste or mouthwash.  Use CHG wipes as directed on instruction sheet.  Do not wear jewelry, make-up, hairpins, clips or nail polish.  Do not wear lotions, powders, or perfumes.   Do not shave body from the neck down 48 hours prior to surgery just in case you cut yourself which could leave a site for infection.  Also, freshly shaved skin may become irritated if using the CHG soap.  Contact lenses, hearing aids and dentures may not be worn into surgery.  Do not bring valuables to the hospital. [facility] is not responsible for any missing/lost belongings or valuables.   Notify your doctor if there is any change in your medical condition (cold, fever, infection).  Wear comfortable clothing (specific to your surgery type) to the hospital.  After surgery, you can help prevent lung complications by doing breathing exercises.  Take deep breaths and cough every 1-2 hours. Your doctor may order a device called an Incentive Spirometer to help you take deep breaths. When coughing or sneezing, hold a pillow firmly against your incision with both hands. This is called splinting. Doing this helps protect your incision. It also decreases belly discomfort.  If you are being admitted to the hospital overnight, leave your suitcase  in the car. After surgery it may be brought to your room.  If you are being discharged the day of surgery, you will not be allowed to drive home. You will need a responsible adult (18 years or older) to drive you home and stay with you that night.   If you are taking public transportation, you will need to have a responsible adult (18 years or older) with you. Please  confirm with your physician that it is acceptable to use public transportation.   Please call the Pre-admissions Testing Dept. at [phone] if you have any questions about these instructions.  Surgery Visitation Policy:  Patients undergoing a surgery or procedure may have one family member or support person with them as long as that person is not COVID-19 positive or experiencing its symptoms.  That person may remain in the waiting area during the procedure and may rotate out with other people.  Inpatient Visitation:    Visiting hours are 7 a.m. to 8 p.m. Up to two visitors ages 16+ are allowed at one time in a patient room. The visitors may rotate out with other people during the day. Visitors must check out when they leave, or other visitors will not be allowed. One designated support person may remain overnight. The visitor must pass COVID-19 screenings, use hand sanitizer when entering and exiting the patients room and wear a mask at all times, including in the patients room. Patients must also wear a mask when staff or their visitor are in the room. Masking is required regardless of vaccination status.

## 2021-09-09 ENCOUNTER — Inpatient Hospital Stay: Admission: RE | Admit: 2021-09-09 | Payer: Medicare (Managed Care) | Source: Ambulatory Visit

## 2021-09-15 ENCOUNTER — Other Ambulatory Visit: Payer: Self-pay

## 2021-09-15 ENCOUNTER — Encounter
Admission: RE | Admit: 2021-09-15 | Discharge: 2021-09-15 | Disposition: A | Discharge status: Home / Self Care | Payer: Medicare (Managed Care) | Source: Ambulatory Visit | Attending: Neurosurgery | Admitting: Neurosurgery

## 2021-09-15 ENCOUNTER — Other Ambulatory Visit: Payer: Medicare (Managed Care)

## 2021-09-15 DIAGNOSIS — Z01812 Encounter for preprocedural laboratory examination: Secondary | ICD-10-CM | POA: Insufficient documentation

## 2021-09-15 DIAGNOSIS — Z79899 Other long term (current) drug therapy: Secondary | ICD-10-CM | POA: Diagnosis not present

## 2021-09-15 DIAGNOSIS — Z20822 Contact with and (suspected) exposure to covid-19: Secondary | ICD-10-CM | POA: Insufficient documentation

## 2021-09-15 DIAGNOSIS — Z01818 Encounter for other preprocedural examination: Secondary | ICD-10-CM

## 2021-09-15 LAB — SURGICAL PCR SCREEN
MRSA, PCR: NEGATIVE
Staphylococcus aureus: NEGATIVE

## 2021-09-15 LAB — SARS CORONAVIRUS 2 (TAT 6-24 HRS): SARS Coronavirus 2: NEGATIVE

## 2021-09-15 LAB — TYPE AND SCREEN
ABO/RH(D): A POS
Antibody Screen: NEGATIVE

## 2021-09-15 LAB — POTASSIUM: Potassium: 3.9 mmol/L (ref 3.5–5.1)

## 2021-09-16 ENCOUNTER — Other Ambulatory Visit: Payer: Medicare (Managed Care)

## 2021-09-19 ENCOUNTER — Other Ambulatory Visit: Payer: Self-pay

## 2021-09-19 ENCOUNTER — Encounter
Admission: RE | Disposition: A | Discharge status: Skilled Nursing Facility | Payer: Self-pay | Source: Home / Self Care | Attending: Neurosurgery

## 2021-09-19 ENCOUNTER — Inpatient Hospital Stay: Payer: Medicare (Managed Care) | Admitting: Urgent Care

## 2021-09-19 ENCOUNTER — Inpatient Hospital Stay: Payer: Medicare (Managed Care)

## 2021-09-19 ENCOUNTER — Inpatient Hospital Stay
Admission: RE | Admit: 2021-09-19 | Discharge: 2021-09-26 | DRG: 460 | Disposition: A | Discharge status: Skilled Nursing Facility | Payer: Medicare (Managed Care) | Attending: Neurosurgery | Admitting: Neurosurgery

## 2021-09-19 ENCOUNTER — Encounter: Payer: Self-pay | Admitting: Neurosurgery

## 2021-09-19 DIAGNOSIS — Z20822 Contact with and (suspected) exposure to covid-19: Secondary | ICD-10-CM | POA: Diagnosis present

## 2021-09-19 DIAGNOSIS — Z96652 Presence of left artificial knee joint: Secondary | ICD-10-CM | POA: Diagnosis present

## 2021-09-19 DIAGNOSIS — M48061 Spinal stenosis, lumbar region without neurogenic claudication: Secondary | ICD-10-CM | POA: Diagnosis present

## 2021-09-19 DIAGNOSIS — K5900 Constipation, unspecified: Secondary | ICD-10-CM | POA: Diagnosis not present

## 2021-09-19 DIAGNOSIS — M4804 Spinal stenosis, thoracic region: Secondary | ICD-10-CM | POA: Diagnosis present

## 2021-09-19 DIAGNOSIS — E785 Hyperlipidemia, unspecified: Secondary | ICD-10-CM | POA: Diagnosis present

## 2021-09-19 DIAGNOSIS — M5104 Intervertebral disc disorders with myelopathy, thoracic region: Secondary | ICD-10-CM | POA: Diagnosis present

## 2021-09-19 DIAGNOSIS — R251 Tremor, unspecified: Secondary | ICD-10-CM | POA: Diagnosis not present

## 2021-09-19 DIAGNOSIS — M4714 Other spondylosis with myelopathy, thoracic region: Secondary | ICD-10-CM | POA: Diagnosis present

## 2021-09-19 DIAGNOSIS — I10 Essential (primary) hypertension: Secondary | ICD-10-CM | POA: Diagnosis present

## 2021-09-19 DIAGNOSIS — Z981 Arthrodesis status: Secondary | ICD-10-CM | POA: Diagnosis not present

## 2021-09-19 DIAGNOSIS — M4324 Fusion of spine, thoracic region: Secondary | ICD-10-CM | POA: Diagnosis present

## 2021-09-19 DIAGNOSIS — Z79899 Other long term (current) drug therapy: Secondary | ICD-10-CM | POA: Diagnosis not present

## 2021-09-19 DIAGNOSIS — Z9049 Acquired absence of other specified parts of digestive tract: Secondary | ICD-10-CM

## 2021-09-19 DIAGNOSIS — M1990 Unspecified osteoarthritis, unspecified site: Secondary | ICD-10-CM | POA: Diagnosis present

## 2021-09-19 HISTORY — PX: APPLICATION OF WOUND VAC: SHX5189

## 2021-09-19 HISTORY — PX: APPLICATION OF INTRAOPERATIVE CT SCAN: SHX6668

## 2021-09-19 LAB — BASIC METABOLIC PANEL
Anion gap: 8 (ref 5–15)
BUN: 23 mg/dL (ref 8–23)
CO2: 25 mmol/L (ref 22–32)
Calcium: 9.7 mg/dL (ref 8.9–10.3)
Chloride: 104 mmol/L (ref 98–111)
Creatinine, Ser: 1.18 mg/dL — ABNORMAL HIGH (ref 0.44–1.00)
GFR, Estimated: 50 mL/min — ABNORMAL LOW (ref >60)
Glucose, Bld: 134 mg/dL — ABNORMAL HIGH (ref 70–99)
Potassium: 4 mmol/L (ref 3.5–5.1)
Sodium: 137 mmol/L (ref 135–145)

## 2021-09-19 SURGERY — POSTERIOR THORACIC FUSION 4 LEVELS
Anesthesia: General | Site: Back

## 2021-09-19 MED ORDER — REMIFENTANIL HCL 1 MG IV SOLR
INTRAVENOUS | Status: AC
  Filled 2021-09-19: qty 1000

## 2021-09-19 MED ORDER — ACETAMINOPHEN 500 MG PO TABS
1000.0000 mg | ORAL_TABLET | Freq: Four times a day (QID) | ORAL | Status: AC
  Administered 2021-09-19 – 2021-09-20 (×4): 1000 mg via ORAL
  Filled 2021-09-19 (×4): qty 2

## 2021-09-19 MED ORDER — ONDANSETRON HCL 4 MG/2ML IJ SOLN: INTRAMUSCULAR | Status: DC | PRN

## 2021-09-19 MED ORDER — PHENOL 1.4 % MT LIQD
1.0000 | OROMUCOSAL | Status: DC | PRN
  Filled 2021-09-19: qty 177

## 2021-09-19 MED ORDER — ONDANSETRON HCL 4 MG PO TABS: 4.0000 mg | ORAL_TABLET | Freq: Four times a day (QID) | ORAL | Status: DC | PRN

## 2021-09-19 MED ORDER — FAMOTIDINE 20 MG PO TABS
20.0000 mg | ORAL_TABLET | Freq: Once | ORAL | Status: AC
  Administered 2021-09-19: 20 mg via ORAL

## 2021-09-19 MED ORDER — ACETAMINOPHEN 500 MG PO TABS
500.0000 mg | ORAL_TABLET | Freq: Four times a day (QID) | ORAL | Status: DC | PRN
  Administered 2021-09-21: 500 mg via ORAL
  Filled 2021-09-19: qty 1

## 2021-09-19 MED ORDER — GABAPENTIN 600 MG PO TABS
600.0000 mg | ORAL_TABLET | Freq: Two times a day (BID) | ORAL | Status: DC
  Administered 2021-09-19 – 2021-09-26 (×14): 600 mg via ORAL
  Filled 2021-09-19 (×14): qty 1

## 2021-09-19 MED ORDER — CHLORHEXIDINE GLUCONATE 0.12 % MT SOLN
OROMUCOSAL | Status: AC
  Filled 2021-09-19: qty 15

## 2021-09-19 MED ORDER — CHLORHEXIDINE GLUCONATE 0.12 % MT SOLN
15.0000 mL | Freq: Once | OROMUCOSAL | Status: AC
  Administered 2021-09-19: 15 mL via OROMUCOSAL

## 2021-09-19 MED ORDER — FENTANYL CITRATE (PF) 100 MCG/2ML IJ SOLN
INTRAMUSCULAR | Status: AC
  Filled 2021-09-19: qty 2

## 2021-09-19 MED ORDER — LIDOCAINE HCL (CARDIAC) PF 100 MG/5ML IV SOSY
PREFILLED_SYRINGE | INTRAVENOUS | Status: DC | PRN
  Administered 2021-09-19: 100 mg via INTRAVENOUS

## 2021-09-19 MED ORDER — OXYCODONE HCL 5 MG PO TABS
10.0000 mg | ORAL_TABLET | ORAL | Status: DC | PRN
  Administered 2021-09-19 – 2021-09-26 (×6): 10 mg via ORAL
  Filled 2021-09-19 (×8): qty 2

## 2021-09-19 MED ORDER — HYDROCHLOROTHIAZIDE 25 MG PO TABS
25.0000 mg | ORAL_TABLET | Freq: Every morning | ORAL | Status: DC
  Administered 2021-09-23 – 2021-09-26 (×4): 25 mg via ORAL
  Filled 2021-09-19 (×5): qty 1

## 2021-09-19 MED ORDER — PROPOFOL 500 MG/50ML IV EMUL
INTRAVENOUS | Status: DC | PRN
  Administered 2021-09-19: 150 ug/kg/min via INTRAVENOUS

## 2021-09-19 MED ORDER — FAMOTIDINE 20 MG PO TABS
ORAL_TABLET | ORAL | Status: AC
  Filled 2021-09-19: qty 1

## 2021-09-19 MED ORDER — ROCURONIUM BROMIDE 10 MG/ML (PF) SYRINGE
PREFILLED_SYRINGE | INTRAVENOUS | Status: AC
  Filled 2021-09-19: qty 10

## 2021-09-19 MED ORDER — DEXAMETHASONE SODIUM PHOSPHATE 10 MG/ML IJ SOLN
INTRAMUSCULAR | Status: DC | PRN
  Administered 2021-09-19: 10 mg via INTRAVENOUS

## 2021-09-19 MED ORDER — PHENYLEPHRINE HCL-NACL 20-0.9 MG/250ML-% IV SOLN
INTRAVENOUS | Status: DC | PRN
  Administered 2021-09-19: 160 ug via INTRAVENOUS
  Administered 2021-09-19: 40 ug/min via INTRAVENOUS

## 2021-09-19 MED ORDER — SODIUM CHLORIDE 0.9 % IV SOLN: INTRAVENOUS | Status: DC

## 2021-09-19 MED ORDER — VANCOMYCIN HCL 1750 MG/350ML IV SOLN
1750.0000 mg | INTRAVENOUS | Status: AC
  Administered 2021-09-19: 1750 mg via INTRAVENOUS
  Filled 2021-09-19: qty 350

## 2021-09-19 MED ORDER — KETOROLAC TROMETHAMINE 15 MG/ML IJ SOLN
7.5000 mg | Freq: Four times a day (QID) | INTRAMUSCULAR | Status: AC
  Administered 2021-09-19 – 2021-09-20 (×4): 7.5 mg via INTRAVENOUS
  Filled 2021-09-19 (×3): qty 1

## 2021-09-19 MED ORDER — LACTATED RINGERS IV SOLN: INTRAVENOUS | Status: DC

## 2021-09-19 MED ORDER — LOSARTAN POTASSIUM 50 MG PO TABS
100.0000 mg | ORAL_TABLET | Freq: Every day | ORAL | Status: DC
  Administered 2021-09-19 – 2021-09-24 (×6): 100 mg via ORAL
  Filled 2021-09-19 (×7): qty 2

## 2021-09-19 MED ORDER — FENTANYL CITRATE (PF) 100 MCG/2ML IJ SOLN
25.0000 ug | INTRAMUSCULAR | Status: DC | PRN
  Administered 2021-09-19: 50 ug via INTRAVENOUS
  Administered 2021-09-19: 25 ug via INTRAVENOUS

## 2021-09-19 MED ORDER — LACTATED RINGERS IV SOLN: INTRAVENOUS | Status: DC | PRN

## 2021-09-19 MED ORDER — PROPOFOL 10 MG/ML IV BOLUS
INTRAVENOUS | Status: AC
  Filled 2021-09-19: qty 20

## 2021-09-19 MED ORDER — BUPIVACAINE-EPINEPHRINE (PF) 0.5% -1:200000 IJ SOLN
INTRAMUSCULAR | Status: DC | PRN
  Administered 2021-09-19: 10 mL

## 2021-09-19 MED ORDER — REMIFENTANIL HCL 1 MG IV SOLR
INTRAVENOUS | Status: DC | PRN
  Administered 2021-09-19: .05 ug/kg/min via INTRAVENOUS

## 2021-09-19 MED ORDER — SURGIFLO WITH THROMBIN (HEMOSTATIC MATRIX KIT) OPTIME
TOPICAL | Status: DC | PRN
  Administered 2021-09-19: 1 via TOPICAL

## 2021-09-19 MED ORDER — METHOCARBAMOL 1000 MG/10ML IJ SOLN
500.0000 mg | Freq: Four times a day (QID) | INTRAVENOUS | Status: DC | PRN
  Filled 2021-09-19 (×2): qty 5

## 2021-09-19 MED ORDER — PROPOFOL 1000 MG/100ML IV EMUL
INTRAVENOUS | Status: AC
  Filled 2021-09-19: qty 100

## 2021-09-19 MED ORDER — ACETAMINOPHEN 10 MG/ML IV SOLN: 1000.0000 mg | Freq: Once | INTRAVENOUS | Status: DC | PRN

## 2021-09-19 MED ORDER — LIDOCAINE 5 % EX PTCH
1.0000 | MEDICATED_PATCH | CUTANEOUS | Status: DC
  Administered 2021-09-19 – 2021-09-25 (×7): 1 via TRANSDERMAL
  Filled 2021-09-19 (×7): qty 1

## 2021-09-19 MED ORDER — DEXTROSE 5 % IV SOLN: 3.0000 g | INTRAVENOUS | Status: DC

## 2021-09-19 MED ORDER — MIDAZOLAM HCL 5 MG/5ML IJ SOLN
INTRAMUSCULAR | Status: DC | PRN
  Administered 2021-09-19 (×2): 1 mg via INTRAVENOUS

## 2021-09-19 MED ORDER — FLEET ENEMA 7-19 GM/118ML RE ENEM
1.0000 | ENEMA | Freq: Once | RECTAL | Status: AC | PRN
  Administered 2021-09-23: 1 via RECTAL

## 2021-09-19 MED ORDER — ATENOLOL 25 MG PO TABS
100.0000 mg | ORAL_TABLET | Freq: Every morning | ORAL | Status: DC
  Administered 2021-09-20 – 2021-09-26 (×6): 100 mg via ORAL
  Filled 2021-09-19 (×6): qty 4

## 2021-09-19 MED ORDER — SIMVASTATIN 20 MG PO TABS
20.0000 mg | ORAL_TABLET | Freq: Every day | ORAL | Status: DC
  Administered 2021-09-19 – 2021-09-25 (×7): 20 mg via ORAL
  Filled 2021-09-19 (×7): qty 1

## 2021-09-19 MED ORDER — MORPHINE SULFATE (PF) 2 MG/ML IV SOLN: 2.0000 mg | INTRAVENOUS | Status: DC | PRN

## 2021-09-19 MED ORDER — SODIUM CHLORIDE (PF) 0.9 % IJ SOLN
INTRAMUSCULAR | Status: DC | PRN
  Administered 2021-09-19: 60 mL

## 2021-09-19 MED ORDER — DEXTROSE 5 % IV SOLN
INTRAVENOUS | Status: DC | PRN
  Administered 2021-09-19: 3 g via INTRAVENOUS

## 2021-09-19 MED ORDER — ONDANSETRON HCL 4 MG/2ML IJ SOLN
INTRAMUSCULAR | Status: DC | PRN
  Administered 2021-09-19: 4 mg via INTRAVENOUS

## 2021-09-19 MED ORDER — KETAMINE HCL 10 MG/ML IJ SOLN
INTRAMUSCULAR | Status: DC | PRN
  Administered 2021-09-19: 20 mg via INTRAVENOUS
  Administered 2021-09-19: 30 mg via INTRAVENOUS

## 2021-09-19 MED ORDER — ACETAMINOPHEN 10 MG/ML IV SOLN
INTRAVENOUS | Status: DC | PRN
Start: 2021-09-19 — End: 2021-09-19
  Administered 2021-09-19: 1000 mg via INTRAVENOUS

## 2021-09-19 MED ORDER — DEXMEDETOMIDINE HCL IN NACL 200 MCG/50ML IV SOLN
INTRAVENOUS | Status: DC | PRN
Start: 2021-09-19 — End: 2021-09-19
  Administered 2021-09-19: 12 ug via INTRAVENOUS

## 2021-09-19 MED ORDER — MIDAZOLAM HCL 2 MG/2ML IJ SOLN
INTRAMUSCULAR | Status: AC
  Filled 2021-09-19: qty 2

## 2021-09-19 MED ORDER — KETAMINE HCL 50 MG/5ML IJ SOSY
PREFILLED_SYRINGE | INTRAMUSCULAR | Status: AC
  Filled 2021-09-19: qty 5

## 2021-09-19 MED ORDER — SENNA 8.6 MG PO TABS
1.0000 | ORAL_TABLET | Freq: Two times a day (BID) | ORAL | Status: DC
  Administered 2021-09-19 – 2021-09-26 (×13): 8.6 mg via ORAL
  Filled 2021-09-19 (×14): qty 1

## 2021-09-19 MED ORDER — PROMETHAZINE HCL 25 MG/ML IJ SOLN: 6.2500 mg | INTRAMUSCULAR | Status: DC | PRN

## 2021-09-19 MED ORDER — LIDOCAINE HCL (PF) 2 % IJ SOLN
INTRAMUSCULAR | Status: AC
  Filled 2021-09-19: qty 5

## 2021-09-19 MED ORDER — ONDANSETRON HCL 4 MG/2ML IJ SOLN: 4.0000 mg | Freq: Four times a day (QID) | INTRAMUSCULAR | Status: DC | PRN

## 2021-09-19 MED ORDER — MENTHOL 3 MG MT LOZG
1.0000 | LOZENGE | OROMUCOSAL | Status: DC | PRN
  Filled 2021-09-19: qty 9

## 2021-09-19 MED ORDER — BISACODYL 5 MG PO TBEC: 5.0000 mg | DELAYED_RELEASE_TABLET | Freq: Every day | ORAL | Status: DC | PRN

## 2021-09-19 MED ORDER — ATROPINE SULFATE 1 MG/ML IV SOLN
INTRAVENOUS | Status: DC | PRN
  Administered 2021-09-19 (×2): .1 mg via INTRAVENOUS

## 2021-09-19 MED ORDER — SUCCINYLCHOLINE CHLORIDE 200 MG/10ML IV SOSY
PREFILLED_SYRINGE | INTRAVENOUS | Status: DC | PRN
  Administered 2021-09-19: 120 mg via INTRAVENOUS

## 2021-09-19 MED ORDER — SODIUM CHLORIDE 0.9% FLUSH: 3.0000 mL | INTRAVENOUS | Status: DC | PRN

## 2021-09-19 MED ORDER — METHOCARBAMOL 500 MG PO TABS
ORAL_TABLET | ORAL | Status: AC
  Administered 2021-09-19: 500 mg via ORAL
  Filled 2021-09-19: qty 1

## 2021-09-19 MED ORDER — SODIUM CHLORIDE 0.9 % IV SOLN: 250.0000 mL | INTRAVENOUS | Status: DC

## 2021-09-19 MED ORDER — FENTANYL CITRATE (PF) 100 MCG/2ML IJ SOLN
INTRAMUSCULAR | Status: AC
  Administered 2021-09-19: 25 ug via INTRAVENOUS
  Filled 2021-09-19: qty 2

## 2021-09-19 MED ORDER — DEXAMETHASONE SODIUM PHOSPHATE 10 MG/ML IJ SOLN
INTRAMUSCULAR | Status: AC
  Filled 2021-09-19: qty 1

## 2021-09-19 MED ORDER — DULOXETINE HCL 60 MG PO CPEP
60.0000 mg | ORAL_CAPSULE | Freq: Every morning | ORAL | Status: DC
  Administered 2021-09-20 – 2021-09-26 (×7): 60 mg via ORAL
  Filled 2021-09-19 (×8): qty 1

## 2021-09-19 MED ORDER — OXYCODONE HCL 5 MG PO TABS
ORAL_TABLET | ORAL | Status: AC
  Filled 2021-09-19: qty 1

## 2021-09-19 MED ORDER — KETOROLAC TROMETHAMINE 15 MG/ML IJ SOLN
INTRAMUSCULAR | Status: AC
  Filled 2021-09-19: qty 1

## 2021-09-19 MED ORDER — METHOCARBAMOL 500 MG PO TABS
500.0000 mg | ORAL_TABLET | Freq: Four times a day (QID) | ORAL | Status: DC | PRN
  Administered 2021-09-24 – 2021-09-26 (×3): 500 mg via ORAL
  Filled 2021-09-19 (×3): qty 1

## 2021-09-19 MED ORDER — LIDOCAINE 4 % EX PTCH: 1.0000 | MEDICATED_PATCH | Freq: Every day | CUTANEOUS | Status: DC | PRN

## 2021-09-19 MED ORDER — FENTANYL CITRATE (PF) 100 MCG/2ML IJ SOLN
INTRAMUSCULAR | Status: DC | PRN
  Administered 2021-09-19 (×2): 50 ug via INTRAVENOUS

## 2021-09-19 MED ORDER — ONDANSETRON HCL 4 MG/2ML IJ SOLN
INTRAMUSCULAR | Status: AC
  Filled 2021-09-19: qty 2

## 2021-09-19 MED ORDER — POLYETHYLENE GLYCOL 3350 17 G PO PACK
17.0000 g | PACK | Freq: Every day | ORAL | Status: DC | PRN
  Administered 2021-09-22 – 2021-09-26 (×3): 17 g via ORAL
  Filled 2021-09-19 (×3): qty 1

## 2021-09-19 MED ORDER — SODIUM CHLORIDE 0.9% FLUSH
3.0000 mL | Freq: Two times a day (BID) | INTRAVENOUS | Status: DC
  Administered 2021-09-20 – 2021-09-26 (×11): 3 mL via INTRAVENOUS

## 2021-09-19 MED ORDER — OXYCODONE HCL 5 MG/5ML PO SOLN: 5.0000 mg | Freq: Once | ORAL | Status: DC | PRN

## 2021-09-19 MED ORDER — OXYCODONE HCL 5 MG PO TABS
5.0000 mg | ORAL_TABLET | ORAL | Status: DC | PRN
  Administered 2021-09-21 – 2021-09-26 (×10): 5 mg via ORAL
  Filled 2021-09-19 (×10): qty 1

## 2021-09-19 MED ORDER — 0.9 % SODIUM CHLORIDE (POUR BTL) OPTIME
TOPICAL | Status: DC | PRN
  Administered 2021-09-19: 500 mL

## 2021-09-19 MED ORDER — CEFAZOLIN IN SODIUM CHLORIDE 3-0.9 GM/100ML-% IV SOLN
3.0000 g | INTRAVENOUS | Status: DC
  Filled 2021-09-19: qty 100

## 2021-09-19 MED ORDER — OXYCODONE HCL 5 MG PO TABS
5.0000 mg | ORAL_TABLET | Freq: Once | ORAL | Status: DC | PRN
Start: 2021-09-19 — End: 2021-09-19

## 2021-09-19 MED ORDER — PROPOFOL 10 MG/ML IV BOLUS
INTRAVENOUS | Status: DC | PRN
  Administered 2021-09-19: 40 mg via INTRAVENOUS
  Administered 2021-09-19: 150 mg via INTRAVENOUS
  Administered 2021-09-19: 50 mg via INTRAVENOUS

## 2021-09-19 MED ORDER — ORAL CARE MOUTH RINSE: 15.0000 mL | Freq: Once | OROMUCOSAL | Status: AC

## 2021-09-19 SURGICAL SUPPLY — 71 items
BUR NEURO DRILL SOFT 3.0X3.8M (BURR) ×3 IMPLANT
CAP LOCKING THREADED (Cap) ×4 IMPLANT
CHLORAPREP W/TINT 26 (MISCELLANEOUS) ×6 IMPLANT
CNTNR SPEC 2.5X3XGRAD LEK (MISCELLANEOUS) ×2
CONT SPEC 4OZ STER OR WHT (MISCELLANEOUS) ×1
CONTAINER SPEC 2.5X3XGRAD LEK (MISCELLANEOUS) ×2 IMPLANT
COUNTER NEEDLE 20/40 LG (NEEDLE) ×3 IMPLANT
DERMABOND ADVANCED (GAUZE/BANDAGES/DRESSINGS) ×1
DERMABOND ADVANCED .7 DNX12 (GAUZE/BANDAGES/DRESSINGS) ×2 IMPLANT
DRAPE C ARM PK CFD 31 SPINE (DRAPES) ×2 IMPLANT
DRAPE INCISE IOBAN 66X45 STRL (DRAPES) ×1 IMPLANT
DRAPE LAPAROTOMY 100X77 ABD (DRAPES) ×3 IMPLANT
DRAPE MICROSCOPE SPINE 48X150 (DRAPES) ×2 IMPLANT
DRAPE SCAN PATIENT (DRAPES) ×1 IMPLANT
DRAPE SURG 17X11 SM STRL (DRAPES) ×4 IMPLANT
DRSG TEGADERM 2-3/8X2-3/4 SM (GAUZE/BANDAGES/DRESSINGS) ×1 IMPLANT
ELECT CAUTERY BLADE TIP 2.5 (TIP) ×3
ELECT EZSTD 165MM 6.5IN (MISCELLANEOUS)
ELECT REM PT RETURN 9FT ADLT (ELECTROSURGICAL) ×3
ELECTRODE CAUTERY BLDE TIP 2.5 (TIP) ×2 IMPLANT
ELECTRODE EZSTD 165MM 6.5IN (MISCELLANEOUS) IMPLANT
ELECTRODE REM PT RTRN 9FT ADLT (ELECTROSURGICAL) ×2 IMPLANT
FEE INTRAOP CADWELL SUPPLY NCS (MISCELLANEOUS) IMPLANT
FEE INTRAOP MONITOR IMPULS NCS (MISCELLANEOUS) IMPLANT
GAUZE 4X4 16PLY ~~LOC~~+RFID DBL (SPONGE) ×3 IMPLANT
GLOVE SURG SYN 6.5 ES PF (GLOVE) ×6 IMPLANT
GLOVE SURG SYN 6.5 PF PI (GLOVE) ×4 IMPLANT
GLOVE SURG SYN 8.5  E (GLOVE) ×3
GLOVE SURG SYN 8.5 E (GLOVE) ×6 IMPLANT
GLOVE SURG SYN 8.5 PF PI (GLOVE) ×6 IMPLANT
GLOVE SURG UNDER POLY LF SZ6.5 (GLOVE) ×3 IMPLANT
GLOVE SURG UNDER POLY LF SZ8.5 (GLOVE) ×3 IMPLANT
GOWN SRG LRG LVL 4 IMPRV REINF (GOWNS) ×2 IMPLANT
GOWN SRG XL LVL 3 NONREINFORCE (GOWNS) ×2 IMPLANT
GOWN STRL NON-REIN TWL XL LVL3 (GOWNS) ×1
GOWN STRL REIN LRG LVL4 (GOWNS) ×1
GRADUATE 1200CC STRL 31836 (MISCELLANEOUS) ×3 IMPLANT
HEMOVAC 400CC 10FR (MISCELLANEOUS) ×1 IMPLANT
INTRAOP CADWELL SUPPLY FEE NCS (MISCELLANEOUS) ×2
INTRAOP DISP SUPPLY FEE NCS (MISCELLANEOUS) ×1
INTRAOP MONITOR FEE IMPULS NCS (MISCELLANEOUS) ×2
INTRAOP MONITOR FEE IMPULSE (MISCELLANEOUS) ×1
KIT PREVENA INCISION MGT20CM45 (CANNISTER) ×1 IMPLANT
KIT SPINAL PRONEVIEW (KITS) ×3 IMPLANT
MANIFOLD NEPTUNE II (INSTRUMENTS) ×3 IMPLANT
MARKER SKIN DUAL TIP RULER LAB (MISCELLANEOUS) ×6 IMPLANT
MARKER SPHERE PSV REFLC 13MM (MARKER) ×7 IMPLANT
NDL SAFETY ECLIPSE 18X1.5 (NEEDLE) ×2 IMPLANT
NEEDLE HYPO 18GX1.5 SHARP (NEEDLE) ×1
NEEDLE HYPO 22GX1.5 SAFETY (NEEDLE) ×3 IMPLANT
NS IRRIG 1000ML POUR BTL (IV SOLUTION) ×3 IMPLANT
PACK LAMINECTOMY NEURO (CUSTOM PROCEDURE TRAY) ×3 IMPLANT
PAD ARMBOARD 7.5X6 YLW CONV (MISCELLANEOUS) ×4 IMPLANT
PUTTY DBX 5CC (Putty) ×1 IMPLANT
ROD STRAIGHT 45MM (Rod) ×2 IMPLANT
SCREW CREO SPINAL 6.5X45 (Screw) ×4 IMPLANT
SPONGE GAUZE 2X2 8PLY STRL LF (GAUZE/BANDAGES/DRESSINGS) ×1 IMPLANT
SURGIFLO W/THROMBIN 8M KIT (HEMOSTASIS) ×3 IMPLANT
SUT BONE WAX W31G (SUTURE) ×1 IMPLANT
SUT DVC VLOC 3-0 CL 6 P-12 (SUTURE) ×4 IMPLANT
SUT ETHILON 3-0 FS-10 30 BLK (SUTURE) ×3
SUT VIC AB 0 CT1 27 (SUTURE) ×3
SUT VIC AB 0 CT1 27XCR 8 STRN (SUTURE) ×4 IMPLANT
SUT VIC AB 2-0 CT1 18 (SUTURE) ×7 IMPLANT
SUTURE EHLN 3-0 FS-10 30 BLK (SUTURE) IMPLANT
SYR 10ML LL (SYRINGE) ×3 IMPLANT
SYR 20ML LL LF (SYRINGE) ×3 IMPLANT
SYR 30ML LL (SYRINGE) ×6 IMPLANT
TOWEL OR 17X26 4PK STRL BLUE (TOWEL DISPOSABLE) ×9 IMPLANT
TUBING CONNECTING 10 (TUBING) ×3 IMPLANT
WATER STERILE IRR 500ML POUR (IV SOLUTION) ×2 IMPLANT

## 2021-09-19 NOTE — Transfer of Care (Signed)
Immediate Anesthesia Transfer of Care Note  Patient: Diana Green  Procedure(s) Performed: OPEN T11-12 POSTERIOR DECOMPRESSION AND FUSION (GLOBUS) APPLICATION OF INTRAOPERATIVE CT SCAN APPLICATION OF WOUND VAC (Back)  Patient Location: PACU  Anesthesia Type:General  Level of Consciousness: drowsy  Airway & Oxygen Therapy: Patient Spontanous Breathing  Post-op Assessment: Report given to RN  Post vital signs: stable  Last Vitals:  Vitals Value Taken Time  BP    Temp    Pulse    Resp    SpO2      Last Pain:  Vitals:   09/19/21 1100  TempSrc: Temporal  PainSc: 5          Complications: No notable events documented.
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
## 2021-09-19 NOTE — Anesthesia Preprocedure Evaluation (Signed)
Anesthesia Evaluation  Patient identified by MRN, date of birth, ID band Patient awake    Reviewed: Allergy & Precautions, NPO status , Patient's Chart, lab work & pertinent test results  Airway Mallampati: II  TM Distance: >3 FB Neck ROM: full    Dental  (+) Edentulous Upper, Partial Lower   Pulmonary neg pulmonary ROS,    Pulmonary exam normal  + decreased breath sounds      Cardiovascular Exercise Tolerance: Poor hypertension, Pt. on medications negative cardio ROS Normal cardiovascular exam Rhythm:Regular Rate:Normal     Neuro/Psych Depression negative neurological ROS  negative psych ROS   GI/Hepatic negative GI ROS, Neg liver ROS,   Endo/Other  negative endocrine ROSMorbid obesity  Renal/GU negative Renal ROS     Musculoskeletal  (+) Arthritis ,   Abdominal (+) + obese,   Peds negative pediatric ROS (+)  Hematology negative hematology ROS (+)   Anesthesia Other Findings Past Medical History: No date: Arthritis No date: Depression No date: High cholesterol No date: Hypertension  Past Surgical History: 07/08/2021: ANTERIOR CERVICAL DECOMP/DISCECTOMY FUSION; N/A     Comment:  Procedure: C3-4 ANTERIOR CERVICAL               DECOMPRESSION/DISCECTOMY FUSION 1 LEVEL WITH HEDRON;                Surgeon: Meade Maw, MD;  Location: [facility] ORS;                Service: Neurosurgery;  Laterality: N/A; 1971: CESAREAN SECTION No date: CHOLECYSTECTOMY No date: COLONOSCOPY 2006: ROTATOR CUFF REPAIR; Right 2014: TOTAL KNEE ARTHROPLASTY; Left 1975: TUBAL LIGATION  BMI    Body Mass Index: 40.61 kg/m      Reproductive/Obstetrics negative OB ROS                             Anesthesia Physical Anesthesia Plan  ASA: 3  Anesthesia Plan: General   Post-op Pain Management:    Induction: Intravenous  PONV Risk Score and Plan: 1 and Ondansetron  Airway Management Planned: Oral  ETT  Additional Equipment:   Intra-op Plan:   Post-operative Plan: Extubation in OR  Informed Consent: I have reviewed the patients History and Physical, chart, labs and discussed the procedure including the risks, benefits and alternatives for the proposed anesthesia with the patient or authorized representative who has indicated his/her understanding and acceptance.     Dental Advisory Given  Plan Discussed with: CRNA and Surgeon  Anesthesia Plan Comments:         Anesthesia Quick Evaluation

## 2021-09-19 NOTE — Op Note (Addendum)
Indications: Ms. 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   Preoperative Note:   Risks of surgery discussed include: infection, bleeding, stroke, coma, death, paralysis, CSF leak, nerve/spinal cord injury, numbness, tingling, weakness, complex regional pain syndrome, recurrent stenosis and/or disc herniation, vascular injury, development of instability, neck/back pain, need for further surgery, persistent symptoms, development of deformity, and the risks of anesthesia. The patient understood these risks and agreed to proceed.  Operative Note:  1. Thoracic decompression T11-12 2. Posterolateral arthrodesis T11 to T12 3. Posterior nonsegmental instrumentation T11 to T12 using [place] Creo 4. Use of stereotaxis 5. Harvesting of autograft via the same incision    The patient was brought to the Operating Room, intubated and turned into the prone position. All pressure points were checked and double checked. Flouroscopy was used to mark the incision. The patient was prepped and draped in the standard fashion. A full timeout was performed. Preoperative antibiotics were given. The incision was injected with local anesthetic.  The incision was opened with a scalpel, then the soft tissues divided with the [place]. Self-retaining retractors were placed. The paraspinus muscles were reflected laterally in subperiosteal fashion until the transverse processes were visible. Flouroscopy was used to confirm our localization.   The self-retaining retractors were  repositioned. We then placed the stereotactic array and acquired 3D CT images with the GE C arm.    We then utilized the image guidance to place pedicle screws.   At T11 on one side, a starting point was chosen based on anatomic landmarks, then the stereotactic drill guide was used to cannulate the pedicle.  This was done on the opposite side as well. Using the guided screwdriver, 6.5x38mm screws were placed bilaterally.  We then performed the same procedure at T12 and placed 6.5x45 mm screws.  After placement of pedicle screws, rods were placed and secured with locking caps.  Motors were checked and were stable.   To decompress the neural elements at T11/12, the drill was used to remove the inferior 2/3 of T11 and superior 1/2 of the T12 lamina.  Portions of this were saved for use as autograft.  The kerrison punches were then used to remove the remainder of the compressive material including the medial facets bilaterally.  The foramina were also decompressed.  A confirmatory CT was taken to ensure good placement of implants.  The wound was copiously irrigated, then the external surfaces of the  remaining lamina, facet, and transverse processes from T11 to T12 were decorticated. A mixture of allograft and autograft was placed over the decorticated surfaces for arthrodesis.  A drain was placed subfascially.   After hemostasis, the wound was closed in layers with 0 and 2-0 vicryl. 3-0 monocryl and a wound vac were applied to the incision.  The patient was then flipped supine and extubated with incident. All counts were correct times 2 at the end of the case. No immediate complications were noted.  Monitoring was stable throughout.  Manning Charity PA assisted in the entire procedure.  Venetia Night MD

## 2021-09-19 NOTE — Progress Notes (Signed)
Cefazolin and Vancomyicn per Pharmacy consult  71 yo F   Weight= 127.7 kg  Will order Cefazolin 3 gm IV pre-op  for wt>120 kg  Vancomycin 1750 mg IV pre-op (~15 mg/kg)  Bari Mantis PharmD Clinical Pharmacist 09/19/2021

## 2021-09-19 NOTE — Anesthesia Procedure Notes (Addendum)
Procedure Name: Intubation Date/Time: 09/19/2021 2:41 PM Performed by: Tammi Klippel, CRNA Pre-anesthesia Checklist: Patient identified, Patient being monitored, Timeout performed, Emergency Drugs available and Suction available Patient Re-evaluated:Patient Re-evaluated prior to induction Oxygen Delivery Method: Circle system utilized Preoxygenation: Pre-oxygenation with 100% oxygen Induction Type: IV induction Ventilation: Mask ventilation without difficulty Laryngoscope Size: 3 and McGraph Grade View: Grade I Tube type: Oral Tube size: 7.0 mm Number of attempts: 1 Airway Equipment and Method: Stylet Placement Confirmation: ETT inserted through vocal cords under direct vision, positive ETCO2 and breath sounds checked- equal and bilateral Secured at: 21 cm Tube secured with: Tape Dental Injury: Teeth and Oropharynx as per pre-operative assessment

## 2021-09-19 NOTE — Progress Notes (Signed)
Patient sleepy upon arrival from OR. Charlett Blake PA at bedside, neuro exam intact. Able to move x4 ext. Indwelling foley intact and patient. 54fr hemovac in place, patent. Medicated for pain as ordered. Family updated regarding room assignment. Afebrile, tolerates gingerale

## 2021-09-20 ENCOUNTER — Encounter: Payer: Self-pay | Admitting: Neurosurgery

## 2021-09-20 LAB — CBC
HCT: 30.7 % — ABNORMAL LOW (ref 36.0–46.0)
Hemoglobin: 9.9 g/dL — ABNORMAL LOW (ref 12.0–15.0)
MCH: 28.9 pg (ref 26.0–34.0)
MCHC: 32.2 g/dL (ref 30.0–36.0)
MCV: 89.8 fL (ref 80.0–100.0)
Platelets: 231 10*3/uL (ref 150–400)
RBC: 3.42 MIL/uL — ABNORMAL LOW (ref 3.87–5.11)
RDW: 12.4 % (ref 11.5–15.5)
WBC: 15.1 10*3/uL — ABNORMAL HIGH (ref 4.0–10.5)
nRBC: 0 % (ref 0.0–0.2)

## 2021-09-20 LAB — GLUCOSE, CAPILLARY: Glucose-Capillary: 152 mg/dL — ABNORMAL HIGH (ref 70–99)

## 2021-09-20 MED ORDER — ENOXAPARIN SODIUM 40 MG/0.4ML IJ SOSY
40.0000 mg | PREFILLED_SYRINGE | INTRAMUSCULAR | Status: DC
  Administered 2021-09-20 – 2021-09-26 (×7): 40 mg via SUBCUTANEOUS
  Filled 2021-09-20 (×7): qty 0.4

## 2021-09-20 NOTE — Progress Notes (Signed)
Physical Therapy Treatment Patient Details Name: Diana Green MRN: [id number] DOB: 07/15/1951 Today's Date: 09/20/2021   History of Present Illness 70yo presenting with thoracic myelopathy s/p T11-12 decompression and fusion on 09/19/21. Significant PMH includes: cervical myelopathy, C3-4 cervical diskectomy and fusion on 07/08/21, arthritis, depression, HLD, and HTN.    PT Comments    Pt was awake and alert resting in recliner upon PT entrance into room. She reports that her pain is a 6/10 currently at rest. Pt reported that she needed to go to the bathroom, so transfer from the recliner was attempted. Initially, sit to stand was attempted 3 times w/ maxAx1 and RW to help get the patient standing in order [facility]. After 3 unsuccessful attempts, sit to stand was attempted 2 times w/ maxAx2 and RW, but was also unsuccessful. During these attempts Pt required increased verbal cueing about proper LE placement for standing, as well as to fully extend through LE and push through RW using UE to help her stand up. After moving recliner closer to side of bed, Pt was able to stand and step pivot transfer to sit EOB w/ maxAx2. Sit to sidelying bed mobility was then performed in order to have patient return to supine in bed and to use the bed pan for voiding due to Pt report of feeling fatigued and unable to take [facility]. Verbal cueing were required in order to educate patient on proper sit to sidelying log rolling technique w/ bed mobility to maintain BLTA precautions. Pt will benefit from continued skilled PT to improve LE strength, mobility, gait, decrease c/o pain, and return to PLOF. Current discharge recommendation remains appropriate due to the level of assistance required by the patient to ensure safety and improve overall function.    Recommendations for follow up therapy are one component of a multi-disciplinary discharge planning process, led by the attending physician.  Recommendations may  be updated based on patient status, additional functional criteria and insurance authorization.  Follow Up Recommendations  Skilled nursing-short term rehab (<3 hours/day)     Assistance Recommended at Discharge Frequent or constant Supervision/Assistance  Patient can return home with the following A lot of help with walking and/or transfers;A lot of help with bathing/dressing/bathroom;Assistance with cooking/housework;Assist for transportation;Help with stairs or ramp for entrance   Equipment Recommendations  Rolling walker (2 wheels)    Recommendations for Other Services       Precautions / Restrictions Precautions Precautions: Back;Fall Precaution Booklet Issued: Yes (comment) Precaution Comments: BLTA Restrictions Weight Bearing Restrictions: No Other Position/Activity Restrictions: no brace     Mobility  Bed Mobility Overal bed mobility: Needs Assistance Bed Mobility: Rolling, Sit to Sidelying Rolling: Min assist (needed verbal cueing/instruction for sit to sidelying mobility while maintaining BLTA precautions.)       Sit to sidelying: Supervision      Transfers Overall transfer level: Needs assistance Equipment used: Rolling walker (2 wheels) Transfers: Sit to/from Stand, Bed to chair/wheelchair/BSC Sit to Stand: Max assist, +2 physical assistance, From elevated surface   Step pivot transfers: Mod assist, +2 physical assistance       General transfer comment: Pt was unable to stand from recliner w/maxAx1 after 3 attempts; After 2 attempts w/ maxAx2 Pt was able to stand from recliner w/ increased time and verbal cueing to fully extend her legs to stand up from recliner to step pivot transfer to EOB.    Ambulation/Gait  Stairs             Wheelchair Mobility    Modified Rankin (Stroke Patients Only)       Balance Overall balance assessment: Needs assistance Sitting-balance support: Bilateral upper extremity supported,  Feet supported Sitting balance-Leahy Scale: Fair     Standing balance support: Bilateral upper extremity supported, During functional activity Standing balance-Leahy Scale: Fair Standing balance comment: needed increased verbal cueing about extending through LE and using UE to push through the walker to fully stand up.                            Cognition Arousal/Alertness: Awake/alert Behavior During Therapy: WFL for tasks assessed/performed Overall Cognitive Status: Within Functional Limits for tasks assessed                                          Exercises      General Comments        Pertinent Vitals/Pain Pain Assessment Pain Assessment: 0-10 Pain Score: 6  Pain Location: thoracic spine Pain Descriptors / Indicators: Grimacing, Aching Pain Intervention(s): Limited activity within patient's tolerance, Monitored during session, Repositioned    Home Living Family/patient expects to be discharged to:: Private residence Living Arrangements: Non-relatives/Friends (Per OT evaluation, Pt's "friend"/"partner" - she reports he is blind and unable to assist) Available Help at Discharge: Family;Available PRN/intermittently Type of Home: House Home Access: Stairs to enter Entrance Stairs-Rails: Can reach both;Right;Left Entrance Stairs-Number of Steps: 3   Home Layout: One level Home Equipment: Agricultural consultant (2 wheels);Shower seat Additional Comments: Pt reports shower chair is very old and worn out.    Prior Function            PT Goals (current goals can now be found in the care plan section) Acute Rehab PT Goals Patient Stated Goal: to decrease pain and improve LE strength to go home. PT Goal Formulation: With patient Time For Goal Achievement: 10/04/21 Potential to Achieve Goals: Fair Progress towards PT goals: Progressing toward goals    Frequency    BID      PT Plan Current plan remains appropriate    Co-evaluation               AM-PAC PT "6 Clicks" Mobility   Outcome Measure  Help needed turning from your back to your side while in a flat bed without using bedrails?: A Lot Help needed moving from lying on your back to sitting on the side of a flat bed without using bedrails?: A Lot Help needed moving to and from a bed to a chair (including a wheelchair)?: A Lot Help needed standing up from a chair using your arms (e.g., wheelchair or bedside chair)?: A Lot Help needed to walk in hospital room?: A Lot Help needed climbing 3-5 steps with a railing? : A Lot 6 Click Score: 12    End of Session Equipment Utilized During Treatment: Gait belt Activity Tolerance: Patient tolerated treatment well;No increased pain;Patient limited by fatigue Patient left: in bed;with bed alarm set;with family/visitor present;with call bell/phone within reach Nurse Communication: Mobility status PT Visit Diagnosis: Unsteadiness on feet (R26.81);Pain;Muscle weakness (generalized) (M62.81) Pain - Right/Left:  (midline) Pain - part of body:  (thoracic spine)     Time: 8502-7741 PT Time Calculation (min) (ACUTE ONLY): 26 min  Charges:  $Therapeutic Activity: 8-22  mins                      Jeralyn Bennett, SPT 09/20/2021, 4:02 PM

## 2021-09-20 NOTE — Progress Notes (Signed)
° °   Attending Progress Note  History: GERTHA HUTMACHER is s/p T11-12 decompression and fusion   POD1: Pt reports new tremors in hands post-op. She remains conscious during these episodes and they same to worsen with goal orientated tasks. Continued decreased sensation in hands and feet which is baseline. Reports soreness in her back but this is largely controlled with current pain medications  Physical Exam: Vitals:   09/19/21 2355 09/20/21 0508  BP: 108/77 101/62  Pulse: 70 63  Resp: 18 18  Temp: 97.8 F (36.6 C) 98.5 F (36.9 C)  SpO2: 96% 95%    AA Ox3 CNI  Strength:5/5 throughout  Incision covered with wound vac HV output 230 post-op   Data:  Recent Labs  Lab 09/19/21 1828  NA 137  K 4.0  CL 104  CO2 25  BUN 23  CREATININE 1.18*  GLUCOSE 134*  CALCIUM 9.7   No results for input(s): AST, ALT, ALKPHOS in the last 168 hours.  Invalid input(s): TBILI   No results for input(s): WBC, HGB, HCT, PLT in the last 168 hours. No results for input(s): APTT, INR in the last 168 hours.       Other tests/results: Cr 1.18 yesterday.   Assessment/Plan:  AVIV SALZ is a 71 y.o presenting with thoracic myelopathy s/p T11-12 decompression and fusion on 09/19/21  - mobilize - pain control - DVT prophylaxis; started Lovenox today. Will monitor Cr and switch to Heparin if it increases. - PTOT - continue wound vac and HV drain  Cooper Render PA-C Department of Neurosurgery

## 2021-09-20 NOTE — Evaluation (Signed)
Occupational Therapy Evaluation Patient Details Name: Diana Green MRN: [id number] DOB: 1951/03/15 Today's Date: 09/20/2021   History of Present Illness 71yo presenting with thoracic myelopathy s/p T11-12 decompression and fusion on 09/19/21. Significant PMH includes: cervical myelopathy, C3-4 cervical diskectomy and fusion on 07/08/21, arthritis, depression, HLD, and HTN.   Clinical Impression   Pt seen for OT evaluation and co-tx with PT this date, POD#1 from above back surgery. Prior to hospital admission, pt was mod indep  with mobility using 2WW, requiring assist for tub transfers and transportation from family and/or friend/partner who lives with her (pt reports he is blind and unable to assist) in 1 story home with 3 steps and bilat rails. No falls in past 6 months. Pt reports not family able to provide 24/7 assist/support. Pt required MOD A for log roll after initial instruction, attempted to stand from elevated bed and was unable to stand with +1 assist. With +2 MAX A ++time pt was able to fully come to standing, MIN A +2 for step pivot to [facility]. MIN A +2 for standing from [facility] and pt able to briefly let go of RW for pericare in standing (MIN A for thoroughness). CGA+2 from [facility] to recliner with PT providing instruction in technique/hand placement. Pt educated in back precautions with handout provided, self care skills, bed mobility and functional transfer training, AE/DME for bathing, dressing, and toileting needs, and home/routines modifications and falls prevention strategies to maximize safety and functional independence while minimizing falls risk and maintaining precautions. Pt verbalized understanding. Handout provided to support recall and carry over of learned precautions/techniques for bed mobility, functional transfers, and self care skills. Pt left with PT for further assessment. Pt will benefit from additional skilled OT services to maximize return to PLOF. Initially recommend SNF with hopes  to improve and upgrade recommendation if pt able to demonstrate progress.      Recommendations for follow up therapy are one component of a multi-disciplinary discharge planning process, led by the attending physician.  Recommendations may be updated based on patient status, additional functional criteria and insurance authorization.   Follow Up Recommendations  Skilled nursing-short term rehab (<3 hours/day)    Assistance Recommended at Discharge Intermittent Supervision/Assistance  Patient can return home with the following Two people to help with walking and/or transfers;A lot of help with bathing/dressing/bathroom;Assistance with cooking/housework;Assist for transportation;Help with stairs or ramp for entrance    Functional Status Assessment  Patient has had a recent decline in their functional status and demonstrates the ability to make significant improvements in function in a reasonable and predictable amount of time.  Equipment Recommendations  BSC/3in1;Tub/shower seat;Other (comment) (reacher, LH sponge, LH shoe horn)    Recommendations for Other Services       Precautions / Restrictions Precautions Precautions: Back;Fall Precaution Booklet Issued: Yes (comment) Precaution Comments: BLTA Restrictions Weight Bearing Restrictions: No Other Position/Activity Restrictions: no brace      Mobility Bed Mobility Overal bed mobility: Needs Assistance Bed Mobility: Rolling, Sidelying to Sit Rolling: Min assist Sidelying to sit: Mod assist       General bed mobility comments: VC after initial instruction in log roll, increased effort/time    Transfers Overall transfer level: Needs assistance Equipment used: Rolling walker (2 wheels) Transfers: Sit to/from Stand, Bed to chair/wheelchair/BSC Sit to Stand: Max assist, +2 physical assistance, From elevated surface     Step pivot transfers: Min assist, +2 physical assistance, Min guard     General transfer comment:  Initial MAX  +  1 unsuccessful, 2nd attempt from elevated EOB with MAX A +2 and +++time to come to fully standing, MIN A +2 for step pivot to [facility], improving with transfer from [facility] to MIN-MOD A +2 and CGA+2 with RW from [facility] to recliner.      Balance Overall balance assessment: Needs assistance Sitting-balance support: Single extremity supported, Bilateral upper extremity supported, No upper extremity supported, Feet supported Sitting balance-Leahy Scale: Fair     Standing balance support: Bilateral upper extremity supported, Single extremity supported, No upper extremity supported, During functional activity Standing balance-Leahy Scale: Fair Standing balance comment: initially heavily reliant BUE on RW, improving to allow for UE vs no UE support on RW briefly for pericare in standing                           ADL either performed or assessed with clinical judgement   ADL Overall ADL's : Needs assistance/impaired                                       General ADL Comments: Pt currently requires MOD A for LB ADL tasks, +1-2 assist for ADL transfers, PRN VC for maintaining back precautions, and supv/set up for unsupported sitting UB ADL.     Vision         Perception     Praxis      Pertinent Vitals/Pain Pain Assessment Pain Assessment: 0-10 Pain Score: 6  Pain Location: 3 at rest, up to 6/10 T spine pain with mobility Pain Descriptors / Indicators: Grimacing, Aching Pain Intervention(s): Limited activity within patient's tolerance, Monitored during session, Premedicated before session, Repositioned     Hand Dominance Right   Extremity/Trunk Assessment Upper Extremity Assessment Upper Extremity Assessment: Generalized weakness (pt reporting new BUE tremors making it difficult to hold a menu this morning but improving and denies difficulty eating breakfast)   Lower Extremity Assessment Lower Extremity Assessment: Generalized weakness   Cervical /  Trunk Assessment Cervical / Trunk Assessment: Back Surgery;Neck Surgery;Other exceptions Cervical / Trunk Exceptions: neck surgery in 11/22, new back surgery   Communication Communication Communication: No difficulties   Cognition Arousal/Alertness: Awake/alert Behavior During Therapy: WFL for tasks assessed/performed Overall Cognitive Status: Within Functional Limits for tasks assessed                                       General Comments       Exercises Other Exercises Other Exercises: Pt instructed in back precautions, falls prevention, log roll technique for bed mobility, ADL transfer training, and AE/DME forADL   Shoulder Instructions      Home Living Family/patient expects to be discharged to:: Private residence Living Arrangements: Non-relatives/Friends (pt's "friend"/"partner" - she reports he is blind and unable to assist) Available Help at Discharge: Family;Available PRN/intermittently (son is a Naval architect, other family PRN) Type of Home: House Home Access: Stairs to enter [employer] of Steps: 3 Entrance Stairs-Rails: Can reach both;Right;Left Home Layout: One level     Bathroom Shower/Tub: Chief Strategy Officer: Standard     Home Equipment: Agricultural consultant (2 wheels);Shower seat   Additional Comments: Pt reports shower chair is very old and worn out.      Prior Functioning/Environment Prior Level of Function : Needs assist  Physical Assist : ADLs (physical)   ADLs (physical): Bathing;IADLs Mobility Comments: pt reports using 2WW for household mobility ADLs Comments: Pt reports indep with dressing, friend/partner helps w/ tub transfers, she uses ACTA or has other family assist w/ transportation (nephew, SIL/BIL), and wears pull ups when out of the home. Pt denies falls.        OT Problem List: Decreased strength;Pain;Impaired balance (sitting and/or standing);Decreased knowledge of use of DME or  AE;Obesity;Decreased knowledge of precautions      OT Treatment/Interventions: Self-care/ADL training;Therapeutic exercise;Therapeutic activities;DME and/or AE instruction;Patient/family education;Balance training    OT Goals(Current goals can be found in the care plan section) Acute Rehab OT Goals Patient Stated Goal: get stronger and have less pain OT Goal Formulation: With patient Time For Goal Achievement: 10/04/21 Potential to Achieve Goals: Good ADL Goals Pt Will Perform Lower Body Bathing: sitting/lateral leans;with adaptive equipment;sit to/from stand;with supervision Pt Will Perform Lower Body Dressing: with adaptive equipment;sit to/from stand;sitting/lateral leans;with supervision Pt Will Transfer to Toilet: with supervision;ambulating (elevated commode, LRAD PRN, maintaining back precautions) Pt Will Perform Toileting - Clothing Manipulation and hygiene: with modified independence;sitting/lateral leans Additional ADL Goal #1: Pt will verbalize 100% of back precautions and how to maintain during ADL/mobility, 3/3 opportunities.  OT Frequency: Min 2X/week    Co-evaluation PT/OT/SLP Co-Evaluation/Treatment: Yes Reason for Co-Treatment: For patient/therapist safety;To address functional/ADL transfers PT goals addressed during session: Mobility/safety with mobility;Balance;Proper use of DME OT goals addressed during session: Proper use of Adaptive equipment and DME;ADL's and self-care      AM-PAC OT "6 Clicks" Daily Activity     Outcome Measure Help from another person eating meals?: None Help from another person taking care of personal grooming?: None Help from another person toileting, which includes using toliet, bedpan, or urinal?: A Lot Help from another person bathing (including washing, rinsing, drying)?: A Lot Help from another person to put on and taking off regular upper body clothing?: A Little Help from another person to put on and taking off regular lower body  clothing?: A Lot 6 Click Score: 17   End of Session Equipment Utilized During Treatment: Rolling walker (2 wheels) Nurse Communication: Mobility status  Activity Tolerance: Patient tolerated treatment well Patient left: in chair;with call bell/phone within reach;Other (comment) (in recliner wiht PT)  OT Visit Diagnosis: Other abnormalities of gait and mobility (R26.89);Muscle weakness (generalized) (M62.81);Pain Pain - Right/Left:  (back)                Time: [id number] OT Time Calculation (min): 43 min Charges:  OT General Charges $OT Visit: 1 Visit OT Evaluation $OT Eval Moderate Complexity: 1 Mod OT Treatments $Self Care/Home Management : 23-37 mins  Arman FilterJamie R., MPH, MS, OTR/L ascom [phone] 09/20/21, 10:39 AM

## 2021-09-20 NOTE — Anesthesia Postprocedure Evaluation (Signed)
Anesthesia Post Note  Patient: Diana Green  Procedure(s) Performed: OPEN T11-12 POSTERIOR DECOMPRESSION AND FUSION (GLOBUS) (Back) APPLICATION OF INTRAOPERATIVE CT SCAN (Back) APPLICATION OF WOUND VAC (Back)  Patient location during evaluation: PACU Anesthesia Type: General Level of consciousness: awake and alert Pain management: pain level controlled Vital Signs Assessment: post-procedure vital signs reviewed and stable Respiratory status: spontaneous breathing, nonlabored ventilation and respiratory function stable Cardiovascular status: blood pressure returned to baseline and stable Postop Assessment: no apparent nausea or vomiting Anesthetic complications: no   No notable events documented.   Last Vitals:  Vitals:   09/20/21 1621 09/20/21 2042  BP: (!) 90/52 101/63  Pulse: (!) 57 66  Resp: 18 16  Temp: 36.6 C 36.9 C  SpO2: 100% 97%    Last Pain:  Vitals:   09/20/21 1700  TempSrc:   PainSc: 2                  Foye Deer

## 2021-09-20 NOTE — TOC Progression Note (Signed)
Transition of Care [facility]) - Progression Note    Patient Details  Name: Diana Green MRN: [id number] Date of Birth: [birth year]  Transition of Care [facility]) CM/SW [place], RN Phone Number: 09/20/2021, 1:24 PM  Clinical Narrative:   Met with the patient to discuss dc plan and needs She lives at home with her friend/partner, she needs to go to [facility] SNF, she is agreeable, Bedsearchj sent , pASSr obtained, FL2 completed, She prefers [facility]         Expected Discharge Plan and Services                                                 Social Determinants of Health (SDOH) Interventions    Readmission Risk Interventions No flowsheet data found.

## 2021-09-20 NOTE — Progress Notes (Signed)
Dr. Myer Haff contacted due to patient having new tremors. Tremors are from fingertips to shoulder. Blood glucose checked to rule out hypoglycemia. MD said he would come evaluate patient. No other new symptoms noted. Patient reports decreased sensation to fingers and feet, but notes that as baseline.

## 2021-09-20 NOTE — Evaluation (Addendum)
Physical Therapy Evaluation Patient Details Name: Diana Green MRN: [id number] DOB: 02-19-1951 Today's Date: 09/20/2021  History of Present Illness  71yo presenting with thoracic myelopathy s/p T11-12 decompression and fusion on 09/19/21. Significant PMH includes: cervical myelopathy, C3-4 cervical diskectomy and fusion on 07/08/21, arthritis, depression, HLD, and HTN.   Clinical Impression     Pt seen for PT evaluation and co-treatment with OT today for mobility, s/p POD#1 from thoracic spine surgery. Pt reports prior to hospitalization, she was modI w/ mobility using 2WW at home and required assistance for tub transfers and transportation from family and/or companion who lives with her. Per OT subjective evaluation, patient reports the companion is blind and often unable to assist. She reports she does not have family that is able to provide 24/7 support/assistance upon discharge.  Pt required modAx2 for EOB sit to stand and transfer to [facility]. She required minAx2 for sit to stand from [facility] and maxAx1 for pericare after using BSC. Pt relied on RW for standing balance, but was able to switch between unilateral, bilateral, and no UE support during pericare needs. She was able to take a few steps in order to transfer to recliner using RW and CGA+2 and required verbal cues for proper UE placement for optimal technique and safety. PT followed up on back precautions w/ Pt at the end of the session, she was able to repeat them back to me after being educated on them by OT. Pt will benefit from continued skilled PT in order to improve LE strength, mobility, gait, decrease c/o pain, and restore PLOF. Current discharge recommendation to SNF is appropriate due to the level of assistance required by the patient to ensure safety and improve overall function.      Recommendations for follow up therapy are one component of a multi-disciplinary discharge planning process, led by the attending physician.  Recommendations  may be updated based on patient status, additional functional criteria and insurance authorization.  Follow Up Recommendations Skilled nursing-short term rehab (<3 hours/day)    Assistance Recommended at Discharge Frequent or constant Supervision/Assistance  Patient can return home with the following  A lot of help with walking and/or transfers;A lot of help with bathing/dressing/bathroom;Assistance with cooking/housework;Assist for transportation;Help with stairs or ramp for entrance    Equipment Recommendations Rolling walker (2 wheels)  Recommendations for Other Services       Functional Status Assessment Patient has had a recent decline in their functional status and demonstrates the ability to make significant improvements in function in a reasonable and predictable amount of time.     Precautions / Restrictions Precautions Precautions: Back;Fall Precaution Booklet Issued: Yes (comment) Precaution Comments: BLTA Restrictions Weight Bearing Restrictions: No Other Position/Activity Restrictions: no brace      Mobility  Bed Mobility                    Transfers Overall transfer level: Needs assistance Equipment used: Rolling walker (2 wheels) Transfers: Sit to/from Stand, Bed to chair/wheelchair/BSC Sit to Stand: Max assist, +2 physical assistance, From elevated surface   Step pivot transfers: Min assist, +2 physical assistance, Min guard       General transfer comment: Per co-treat eval w/ OT: Initial MAX  +1 unsuccessful, 2nd attempt from elevated EOB with MAX A +2 and +++time to come to fully standing, MIN A +2 for step pivot to [facility], improving with transfer from [facility] to MIN-MOD A +2 and CGA+2 with RW from [facility] to recliner.  Ambulation/Gait                  Stairs            Wheelchair Mobility    Modified Rankin (Stroke Patients Only)       Balance Overall balance assessment: Needs assistance Sitting-balance support: Bilateral upper  extremity supported, Feet supported Sitting balance-Leahy Scale: Fair     Standing balance support: Bilateral upper extremity supported, Single extremity supported, During functional activity, No upper extremity supported Standing balance-Leahy Scale: Fair Standing balance comment: initially heavily reliant BUE on RW, improving to allow for UE vs no UE support on RW briefly for pericare in standing                             Pertinent Vitals/Pain Pain Assessment Pain Assessment: 0-10 Pain Score: 6  Pain Location: thoracic spine Pain Descriptors / Indicators: Grimacing, Aching Pain Intervention(s): Limited activity within patient's tolerance, Monitored during session, Premedicated before session, Repositioned    Home Living Family/patient expects to be discharged to:: Private residence Living Arrangements: Non-relatives/Friends (Per OT evaluation, Pt's "friend"/"partner" - she reports he is blind and unable to assist) Available Help at Discharge: Family;Available PRN/intermittently Type of Home: House Home Access: Stairs to enter Entrance Stairs-Rails: Can reach both;Right;Left Entrance Stairs-Number of Steps: 3   Home Layout: One level Home Equipment: Agricultural consultantolling Walker (2 wheels);Shower seat Additional Comments: Pt reports shower chair is very old and worn out.    Prior Function Prior Level of Function : Needs assist       Physical Assist : ADLs (physical)   ADLs (physical): Bathing;IADLs Mobility Comments: pt reports using 2WW for household mobility ADLs Comments: Pt reports indep with dressing, friend/partner helps w/ tub transfers, she uses ACTA or has other family assist w/ transportation (nephew, SIL/BIL), and wears pull ups when out of the home. Pt denies falls.     Hand Dominance   Dominant Hand: Right    Extremity/Trunk Assessment   Upper Extremity Assessment Upper Extremity Assessment: Generalized weakness    Lower Extremity Assessment Lower  Extremity Assessment: Generalized weakness    Cervical / Trunk Assessment Cervical / Trunk Assessment: Back Surgery;Neck Surgery;Other exceptions Cervical / Trunk Exceptions: neck surgery in 11/22, new back surgery  Communication   Communication: No difficulties  Cognition Arousal/Alertness: Awake/alert Behavior During Therapy: WFL for tasks assessed/performed Overall Cognitive Status: Within Functional Limits for tasks assessed                                          General Comments      Exercises     Assessment/Plan    PT Assessment Patient needs continued PT services  PT Problem List Decreased strength;Decreased mobility;Decreased activity tolerance;Decreased balance;Pain;Decreased knowledge of precautions;Decreased knowledge of use of DME       PT Treatment Interventions DME instruction;Gait training;Stair training;Functional mobility training;Therapeutic activities;Patient/family education;Balance training;Therapeutic exercise    PT Goals (Current goals can be found in the Care Plan section)  Acute Rehab PT Goals Patient Stated Goal: to decrease pain and improve LE strength to go home. PT Goal Formulation: With patient Time For Goal Achievement: 10/04/21 Potential to Achieve Goals: Fair    Frequency BID     Co-evaluation PT/OT/SLP Co-Evaluation/Treatment: Yes Reason for Co-Treatment: For patient/therapist safety;To address functional/ADL transfers PT goals addressed during session: Mobility/safety  with mobility;Balance OT goals addressed during session: ADL's and self-care;Proper use of Adaptive equipment and DME       AM-PAC PT "6 Clicks" Mobility  Outcome Measure Help needed turning from your back to your side while in a flat bed without using bedrails?: A Lot Help needed moving from lying on your back to sitting on the side of a flat bed without using bedrails?: A Lot Help needed moving to and from a bed to a chair (including a  wheelchair)?: A Lot Help needed standing up from a chair using your arms (e.g., wheelchair or bedside chair)?: A Lot Help needed to walk in hospital room?: A Lot Help needed climbing 3-5 steps with a railing? : A Lot 6 Click Score: 12    End of Session Equipment Utilized During Treatment: Gait belt Activity Tolerance: Patient tolerated treatment well;No increased pain;Patient limited by fatigue Patient left: in chair;with call bell/phone within reach;with chair alarm set Nurse Communication: Mobility status PT Visit Diagnosis: Unsteadiness on feet (R26.81);Pain;Muscle weakness (generalized) (M62.81) Pain - Right/Left:  (t-spine pain)    Time: 0950-1020 PT Time Calculation (min) (ACUTE ONLY): 30 min   Charges:   PT Evaluation $PT Eval Low Complexity: 1 Low PT Treatments $Therapeutic Activity: 8-22 mins        Jeralyn Bennett, SPT 09/20/2021, 1:37 PM

## 2021-09-20 NOTE — [place] FL2 (Signed)
[place] MEDICAID FL2 LEVEL OF CARE SCREENING TOOL     IDENTIFICATION  Patient Name: Diana Green Birthdate: [birth year] Sex: female Admission Date (Current Location): 09/19/2021  [place] and [place] Number:  Chiropodist and Address:  [facility], [address] [place], [place] [address]      Provider Number: [id number]  Attending Physician Name and Address:  Venetia Night, MD  Relative Name and Phone Number:  Acquanetta Belling [phone]    Current Level of Care: Hospital Recommended Level of Care: Skilled Nursing Facility Prior Approval Number:    Date Approved/Denied:   PASRR Number: [id number]  Discharge Plan: SNF    Current Diagnoses: Patient Active Problem List   Diagnosis Date Noted   Thoracic myelopathy 09/19/2021   Cervical myelopathy (HCC) 07/08/2021    Orientation RESPIRATION BLADDER Height & Weight     Self, Time, Situation, Place  Normal Continent Weight: 124.7 kg Height:  5\' 9"  (175.3 cm)  BEHAVIORAL SYMPTOMS/MOOD NEUROLOGICAL BOWEL NUTRITION STATUS      Continent Diet (regular)  AMBULATORY STATUS COMMUNICATION OF NEEDS Skin   Extensive Assist Verbally Normal, Surgical wounds                       Personal Care Assistance Level of Assistance  Bathing, Feeding, Dressing Bathing Assistance: Limited assistance Feeding assistance: Limited assistance Dressing Assistance: Limited assistance     Functional Limitations Info             SPECIAL CARE FACTORS FREQUENCY  PT (By licensed PT), OT (By licensed OT)     PT Frequency: 5 times per week OT Frequency: 5 times per week            Contractures Contractures Info: Not present    Additional Factors Info  Code Status, Allergies Code Status Info: fullcode Allergies Info: NKDA           Current Medications (09/20/2021):  This is the current hospital active medication list Current Facility-Administered Medications  Medication Dose Route  Frequency Provider Last Rate Last Admin   0.9 %  sodium chloride infusion  250 mL Intravenous Continuous 09/22/2021, PA       0.9 %  sodium chloride infusion   Intravenous Continuous Susanne Borders, PA   Stopped at 09/20/21 0940   acetaminophen (TYLENOL) tablet 1,000 mg  1,000 mg Oral Q6H 09/22/21, PA   1,000 mg at 09/20/21 1159   acetaminophen (TYLENOL) tablet 500-1,000 mg  500-1,000 mg Oral Q6H PRN 09/22/21, PA       atenolol (TENORMIN) tablet 100 mg  100 mg Oral q AM Susanne Borders, PA   100 mg at 09/20/21 09/22/21   bisacodyl (DULCOLAX) EC tablet 5 mg  5 mg Oral Daily PRN 8341, PA       DULoxetine (CYMBALTA) DR capsule 60 mg  60 mg Oral q AM Susanne Borders, PA   60 mg at 09/20/21 0939   enoxaparin (LOVENOX) injection 40 mg  40 mg Subcutaneous Q24H 09/22/21, Susanne Borders   40 mg at 09/20/21 09/22/21   gabapentin (NEURONTIN) tablet 600 mg  600 mg Oral BID 9622, PA   600 mg at 09/20/21 09/22/21   hydrochlorothiazide (HYDRODIURIL) tablet 25 mg  25 mg Oral q AM 2979, PA       lidocaine (LIDODERM) 5 % 1 patch  1 patch Transdermal Q24H Susanne Borders, MD  1 patch at 09/19/21 2149   losartan (COZAAR) tablet 100 mg  100 mg Oral QHS Susanne Borders, [place]   100 mg at 09/19/21 2148   menthol-cetylpyridinium (CEPACOL) lozenge 3 mg  1 lozenge Oral PRN Susanne Borders, PA       Or   phenol (CHLORASEPTIC) mouth spray 1 spray  1 spray Mouth/Throat PRN Susanne Borders, PA       methocarbamol (ROBAXIN) tablet 500 mg  500 mg Oral Q6H PRN Susanne Borders, PA   500 mg at 09/19/21 1913   Or   methocarbamol (ROBAXIN) 500 mg in dextrose 5 % 50 mL IVPB  500 mg Intravenous Q6H PRN Susanne Borders, PA       morphine 2 MG/ML injection 2 mg  2 mg Intravenous Q2H PRN Susanne Borders, PA       ondansetron [facility]) tablet 4 mg  4 mg Oral Q6H PRN Susanne Borders, PA       Or   ondansetron [facility]) injection 4 mg  4 mg Intravenous  Q6H PRN Susanne Borders, PA       oxyCODONE (Oxy IR/ROXICODONE) immediate release tablet 10 mg  10 mg Oral Q3H PRN Susanne Borders, PA   10 mg at 09/20/21 9622   oxyCODONE (Oxy IR/ROXICODONE) immediate release tablet 5 mg  5 mg Oral Q3H PRN Susanne Borders, PA       polyethylene glycol (MIRALAX / GLYCOLAX) packet 17 g  17 g Oral Daily PRN Susanne Borders, PA       senna (SENOKOT) tablet 8.6 mg  1 tablet Oral BID Susanne Borders, PA   8.6 mg at 09/20/21 2979   simvastatin (ZOCOR) tablet 20 mg  20 mg Oral QHS Susanne Borders, PA   20 mg at 09/19/21 2149   sodium chloride flush (NS) 0.9 % injection 3 mL  3 mL Intravenous Q12H Susanne Borders, PA   3 mL at 09/20/21 0900   sodium chloride flush (NS) 0.9 % injection 3 mL  3 mL Intravenous PRN Susanne Borders, PA       sodium phosphate (FLEET) 7-19 GM/118ML enema 1 enema  1 enema Rectal Once PRN Susanne Borders, PA         Discharge Medications: Please see discharge summary for a list of discharge medications.  Relevant Imaging Results:  Relevant Lab Results:   Additional Information SS# [id number]  Marlowe Sax, RN

## 2021-09-21 LAB — BASIC METABOLIC PANEL
Anion gap: 5 (ref 5–15)
BUN: 30 mg/dL — ABNORMAL HIGH (ref 8–23)
CO2: 28 mmol/L (ref 22–32)
Calcium: 9.3 mg/dL (ref 8.9–10.3)
Chloride: 106 mmol/L (ref 98–111)
Creatinine, Ser: 1.27 mg/dL — ABNORMAL HIGH (ref 0.44–1.00)
GFR, Estimated: 45 mL/min — ABNORMAL LOW (ref >60)
Glucose, Bld: 115 mg/dL — ABNORMAL HIGH (ref 70–99)
Potassium: 3.9 mmol/L (ref 3.5–5.1)
Sodium: 139 mmol/L (ref 135–145)

## 2021-09-21 NOTE — Progress Notes (Signed)
° °   Attending Progress Note  History: Diana Green is s/p T11-12 decompression and fusion   POD2: soreness in legs yesterday. No new or worsening symptoms   POD1: Pt reports new tremors in hands post-op. She remains conscious during these episodes and they same to worsen with goal orientated tasks. Continued decreased sensation in hands and feet which is baseline. Reports soreness in her back but this is largely controlled with current pain medications  Physical Exam: Vitals:   09/21/21 0527 09/21/21 0738  BP: 117/66 121/61  Pulse: 60 61  Resp: 19 16  Temp: 98.3 F (36.8 C) 98.1 F (36.7 C)  SpO2: 97% 97%    AA Ox3 CNI  Strength:5/5 throughout  Incision covered with wound vac HV output 30 overnight   Data:  Recent Labs  Lab 09/19/21 1828  NA 137  K 4.0  CL 104  CO2 25  BUN 23  CREATININE 1.18*  GLUCOSE 134*  CALCIUM 9.7    No results for input(s): AST, ALT, ALKPHOS in the last 168 hours.  Invalid input(s): TBILI   Recent Labs  Lab 09/20/21 1231  WBC 15.1*  HGB 9.9*  HCT 30.7*  PLT 231   No results for input(s): APTT, INR in the last 168 hours.       Other tests/results: Cr 1.18   Assessment/Plan:  Diana Green is a 71 y.o presenting with thoracic myelopathy s/p T11-12 decompression and fusion on 09/19/21  - mobilize - pain control - DVT prophylaxis; started Lovenox today. Will monitor Cr and switch to Heparin if it increases. - PTOT - continue wound vac  - will consider HV removal this afternoon - dispo planning underway. Plans for discharge to SNF  Manning Charity PA-C Department of Neurosurgery

## 2021-09-21 NOTE — TOC Progression Note (Signed)
Transition of Care [facility]) - Progression Note    Patient Details  Name: Diana Green MRN: [id number] Date of Birth: [birth year]  Transition of [facility]) CM/SW Contact  Marlowe Sax, RN Phone Number: 09/21/2021, 2:27 PM  Clinical Narrative:    Received a call from Tammy at [facility], they determined they are Not INN with the patient'[s insurance and therefore unable to accept, I resent out the bedsearch, looking for a STR SNF        Expected Discharge Plan and Services                                                 Social Determinants of Health (SDOH) Interventions    Readmission Risk Interventions No flowsheet data found.

## 2021-09-21 NOTE — Progress Notes (Addendum)
Physical Therapy Treatment Patient Details Name: Diana Green MRN: [id number] DOB: [birth year] Today's Date: 09/21/2021   History of Present Illness 71yo presenting with thoracic myelopathy s/p T11-12 decompression and fusion on 09/19/21. Significant PMH includes: cervical myelopathy, C3-4 cervical diskectomy and fusion on 07/08/21, arthritis, depression, HLD, and HTN.    PT Comments    Pt awake and alert resting in bed upon PT/OT entrance into room for co-tx. Pt reports current pain at rest as 3/10. She is progressing well compared to prior PT sessions. Sit to stand was performed w/ modAx2 and use of bariatric RW. Once standing, patient was able to step pivot to use BSC w/ CGA and BRW. After finishing, Pt was able to perform pericare needs w/ modA. From there she was able to ambulate ~23ft w/ CGA and BRW. Her overall strength and endurance w/ ambulation has improved comparatively to this morning/prior sessions. Pt will benefit from continued skilled PT in order to improve LE strength, mobility, gait, and restore PLOF. Current discharge recommendation remains appropriate due to the level of assistance required by the patient to ensure safety and improve overall function.   Recommendations for follow up therapy are one component of a multi-disciplinary discharge planning process, led by the attending physician.  Recommendations may be updated based on patient status, additional functional criteria and insurance authorization.  Follow Up Recommendations  Skilled nursing-short term rehab (<3 hours/day)     Assistance Recommended at Discharge Frequent or constant Supervision/Assistance  Patient can return home with the following A lot of help with walking and/or transfers;A lot of help with bathing/dressing/bathroom;Assistance with cooking/housework;Assist for transportation;Help with stairs or ramp for entrance   Equipment Recommendations  Rolling walker (2 wheels)    Recommendations for Other  Services       Precautions / Restrictions Precautions Precautions: Back;Fall Precaution Booklet Issued: Yes (comment) Precaution Comments: BLTA Restrictions Weight Bearing Restrictions: No Other Position/Activity Restrictions: no brace     Mobility  Bed Mobility         Transfers Overall transfer level: Needs assistance Equipment used: Rolling walker (2 wheels) Transfers: Sit to/from Stand, Bed to chair/wheelchair/BSC Sit to Stand: Mod assist, +2 physical assistance Stand pivot transfers: Mod assist, +2 physical assistance Step pivot transfers: Mod assist, +2 physical assistance       General transfer comment: Pt able to progress to +1 CGA for functional mobiltiy in room.    Ambulation/Gait Ambulation/Gait assistance: Min guard Gait Distance (Feet): 60 Feet Assistive device: Rolling walker (2 wheels) Gait Pattern/deviations: Step-to pattern, Decreased step length - right, Decreased step length - left, Decreased stride length Gait velocity: decreased         Stairs             Wheelchair Mobility    Modified Rankin (Stroke Patients Only)       Balance Overall balance assessment: Needs assistance Sitting-balance support: Bilateral upper extremity supported, Feet supported Sitting balance-Leahy Scale: Fair     Standing balance support: Bilateral upper extremity supported, During functional activity, Single extremity supported Standing balance-Leahy Scale: Fair Standing balance comment: Briefly able to static stand with 1 UE support during peri-care.                            Cognition Arousal/Alertness: Awake/alert Behavior During Therapy: WFL for tasks assessed/performed Overall Cognitive Status: Within Functional Limits for tasks assessed  Exercises      General Comments        Pertinent Vitals/Pain Pain Assessment Pain Assessment: 0-10 Pain Score: 3  Pain  Location: thoracic spine Pain Descriptors / Indicators: Grimacing, Guarding, Discomfort Pain Intervention(s): Limited activity within patient's tolerance, Monitored during session, Repositioned    Home Living                          Prior Function            PT Goals (current goals can now be found in the care plan section) Progress towards PT goals: Progressing toward goals    Frequency    BID      PT Plan Current plan remains appropriate    Co-evaluation PT/OT/SLP Co-Evaluation/Treatment: Yes Reason for Co-Treatment: Complexity of the patient's impairments (multi-system involvement);For patient/therapist safety;To address functional/ADL transfers PT goals addressed during session: Mobility/safety with mobility;Balance OT goals addressed during session: ADL's and self-care;Proper use of Adaptive equipment and DME      AM-PAC PT "6 Clicks" Mobility   Outcome Measure  Help needed turning from your back to your side while in a flat bed without using bedrails?: A Lot Help needed moving from lying on your back to sitting on the side of a flat bed without using bedrails?: A Lot Help needed moving to and from a bed to a chair (including a wheelchair)?: A Lot Help needed standing up from a chair using your arms (e.g., wheelchair or bedside chair)?: A Lot Help needed to walk in hospital room?: A Little Help needed climbing 3-5 steps with a railing? : A Lot 6 Click Score: 13    End of Session Equipment Utilized During Treatment: Gait belt Activity Tolerance: Patient tolerated treatment well;No increased pain;Patient limited by fatigue Patient left: in chair;with call bell/phone within reach;with chair alarm set Nurse Communication: Mobility status PT Visit Diagnosis: Unsteadiness on feet (R26.81);Pain;Muscle weakness (generalized) (M62.81) Pain - Right/Left:  (midline) Pain - part of body:  (thoracic spine)     Time: 1345-1406 PT Time Calculation (min) (ACUTE  ONLY): 21 min  Charges:                         Jonnie Kind, SPT 09/21/2021, 3:58 PM

## 2021-09-21 NOTE — Progress Notes (Addendum)
Physical Therapy Treatment Patient Details Name: Diana Green MRN: [id number] DOB: 19-Apr-1951 Today's Date: 09/21/2021   History of Present Illness 70yo presenting with thoracic myelopathy s/p T11-12 decompression and fusion on 09/19/21. Significant PMH includes: cervical myelopathy, C3-4 cervical diskectomy and fusion on 07/08/21, arthritis, depression, HLD, and HTN.    PT Comments    Pt was resting in bed upon PT entrance into the room. She woke to tactile cue and was alert and orientated once awake. She reports her current pain is a 3/10 at rest. Pt was able to perform bed mobility w/ HOB flat and utilizing log rolling technique w/ reliance on bed railing. Increased verbal cueing on proper log rolling technique in order to maintain spinal precautions. Once sidelying on left side Pt required modA in order to elevate trunk to sit EOB. Pt was able to perform sit to stand w/ maxAx1 from elevated bed height, but 2nd PT needed to physically stabilize bariatric RW during sit to stand transfer due to Pt pulling back on walker to help stand. Once standing Pt was able to ambulate ~78ft w/ CGA using bariatric RW before increase in fatigue and needing to sit back down in recliner. Pt will benefit from continued skilled PT to improve LE strength, mobility, gait, decrease c/o pain, and restore PLOF. Current discharge recommendation remains appropriate due to the level of assistance required by the patient to ensure safety and improve overall function.   Recommendations for follow up therapy are one component of a multi-disciplinary discharge planning process, led by the attending physician.  Recommendations may be updated based on patient status, additional functional criteria and insurance authorization.  Follow Up Recommendations  Skilled nursing-short term rehab (<3 hours/day)     Assistance Recommended at Discharge Frequent or constant Supervision/Assistance  Patient can return home with the following A lot  of help with walking and/or transfers;A lot of help with bathing/dressing/bathroom;Assistance with cooking/housework;Assist for transportation;Help with stairs or ramp for entrance   Equipment Recommendations  Rolling walker (2 wheels)    Recommendations for Other Services       Precautions / Restrictions Precautions Precautions: Back;Fall Precaution Booklet Issued: Yes (comment) Precaution Comments: BLTA Restrictions Weight Bearing Restrictions: No Other Position/Activity Restrictions: no brace     Mobility  Bed Mobility Overal bed mobility: Needs Assistance Bed Mobility: Rolling, Sidelying to Sit Rolling: Min assist Sidelying to sit: Mod assist       General bed mobility comments: Verbal cues to utilize bed railing for rolling technique and modA for trunk elevation.    Transfers Overall transfer level: Needs assistance Equipment used: Rolling walker (2 wheels) Transfers: Sit to/from Stand, Bed to chair/wheelchair/BSC Sit to Stand: Max assist, +2 physical assistance, From elevated surface           General transfer comment: Pt was able to stand from bed w/maxAx1 with bed elevated, however 2nd PT stood by for assist and kept walker stable during sit to stand.    Ambulation/Gait Ambulation/Gait assistance: Min guard Gait Distance (Feet): 30 Feet Assistive device: Rolling walker (2 wheels) Gait Pattern/deviations: Step-to pattern, Decreased step length - right, Decreased step length - left, Decreased stride length Gait velocity: decreased         Stairs             Wheelchair Mobility    Modified Rankin (Stroke Patients Only)       Balance Overall balance assessment: Needs assistance Sitting-balance support: Bilateral upper extremity supported, Feet supported Sitting balance-Leahy Scale: Fair  Standing balance support: Bilateral upper extremity supported, During functional activity Standing balance-Leahy Scale: Fair Standing balance  comment: Pt still required verbal cueing about extending through LE and using UE to push through the walker to fully stand up.                            Cognition Arousal/Alertness: Awake/alert Behavior During Therapy: WFL for tasks assessed/performed Overall Cognitive Status: Within Functional Limits for tasks assessed                                          Exercises      General Comments        Pertinent Vitals/Pain Pain Assessment Pain Assessment: 0-10 Pain Score: 3  Pain Location: thoracic spine Pain Descriptors / Indicators: Grimacing, Aching Pain Intervention(s): Limited activity within patient's tolerance, Monitored during session, Repositioned    Home Living                          Prior Function            PT Goals (current goals can now be found in the care plan section) Progress towards PT goals: Progressing toward goals    Frequency    BID      PT Plan Current plan remains appropriate    Co-evaluation              AM-PAC PT "6 Clicks" Mobility   Outcome Measure  Help needed turning from your back to your side while in a flat bed without using bedrails?: A Lot Help needed moving from lying on your back to sitting on the side of a flat bed without using bedrails?: A Lot Help needed moving to and from a bed to a chair (including a wheelchair)?: A Lot Help needed standing up from a chair using your arms (e.g., wheelchair or bedside chair)?: A Lot Help needed to walk in hospital room?: A Little Help needed climbing 3-5 steps with a railing? : A Lot 6 Click Score: 13    End of Session Equipment Utilized During Treatment: Gait belt Activity Tolerance: Patient tolerated treatment well;No increased pain;Patient limited by fatigue Patient left: in chair;with call bell/phone within reach;with chair alarm set;with nursing/sitter in room Nurse Communication: Mobility status PT Visit Diagnosis: Unsteadiness  on feet (R26.81);Pain;Muscle weakness (generalized) (M62.81) Pain - Right/Left:  (midline) Pain - part of body:  (thoracic spine)     Time: 1497-0263 PT Time Calculation (min) (ACUTE ONLY): 25 min  Charges:  $Therapeutic Exercise: 8-22 mins $Therapeutic Activity: 8-22 mins                      Jeralyn Bennett, SPT 09/21/2021, 11:53 AM

## 2021-09-21 NOTE — Progress Notes (Signed)
Occupational Therapy Treatment Patient Details Name: Diana Green MRN: [id number] DOB: [birth year] Today's Date: 09/21/2021   History of present illness 71yo presenting with thoracic myelopathy s/p T11-12 decompression and fusion on 09/19/21. Significant PMH includes: cervical myelopathy, C3-4 cervical diskectomy and fusion on 07/08/21, arthritis, depression, HLD, and HTN.   OT comments  Diana Green was seen for OT/PT co-tx this date (units split per protocols). She is received seated in recliner. Pt requires cueing to return verbalize back precautions this date. OT/PT facilitate ADL tasks and functional mobility as described below. Pt demonstrates improved functional performance from past OT sessions, but continues to require MOD A for peri-care, MOD +2 for STS and toilet transfer. She is able to progress to +1 assist for functional mobility in room by end of session. Pt able to return verbalize 4/4 back precautions at end of session with min cueing. OT assists pt with donning compression stockings. Pt left in recliner, chair alarm set, call bell in reach. Pt continues to benefit from skilled OT services. POC and DC recs remain appropriate.    Recommendations for follow up therapy are one component of a multi-disciplinary discharge planning process, led by the attending physician.  Recommendations may be updated based on patient status, additional functional criteria and insurance authorization.    Follow Up Recommendations  Skilled nursing-short term rehab (<3 hours/day)    Assistance Recommended at Discharge Intermittent Supervision/Assistance  Patient can return home with the following  Two people to help with walking and/or transfers;A lot of help with bathing/dressing/bathroom;Assistance with cooking/housework;Assist for transportation;Help with stairs or ramp for entrance   Equipment Recommendations  BSC/3in1;Tub/shower seat;Other (comment)    Recommendations for Other Services       Precautions / Restrictions Precautions Precautions: Back;Fall Precaution Booklet Issued: Yes (comment) Precaution Comments: BLTA Restrictions Weight Bearing Restrictions: No Other Position/Activity Restrictions: no brace       Mobility Bed Mobility Overal bed mobility: Needs Assistance             General bed mobility comments: Deferred. Pt in recliner at start/end of session.    Transfers Overall transfer level: Needs assistance Equipment used: Rolling walker (2 wheels) Transfers: Sit to/from Stand, Bed to chair/wheelchair/BSC Sit to Stand: Mod assist, +2 physical assistance Stand pivot transfers: Mod assist, +2 physical assistance   Step pivot transfers: Mod assist, +2 physical assistance     General transfer comment: Pt able to progress to +1 CGA for functional mobiltiy in room.     Balance Overall balance assessment: Needs assistance Sitting-balance support: Bilateral upper extremity supported, Feet supported Sitting balance-Leahy Scale: Fair     Standing balance support: Bilateral upper extremity supported, During functional activity, Single extremity supported Standing balance-Leahy Scale: Fair Standing balance comment: Briefly able to static stand with 1 UE support during peri-care.                           ADL either performed or assessed with clinical judgement   ADL Overall ADL's : Needs assistance/impaired                                       General ADL Comments: Initial Mod +2 for STS from recliner and t/f to [facility]. +1 MOD for peri care for thoroughness and to support adherence to back precautions. MAX A to don compression stockings at end of session. Pt  educated on AE options for peri-care with back precautions and given AE catalog. Return verbalizes understanding.    Extremity/Trunk Assessment Upper Extremity Assessment Upper Extremity Assessment: Generalized weakness   Lower Extremity Assessment Lower Extremity  Assessment: Generalized weakness   Cervical / Trunk Assessment Cervical / Trunk Assessment: Back Surgery;Neck Surgery;Other exceptions    Vision Baseline Vision/History: 1 Wears glasses Ability to See in Adequate Light: 1 Impaired Patient Visual Report: No change from baseline     Perception     Praxis      Cognition Arousal/Alertness: Awake/alert Behavior During Therapy: WFL for tasks assessed/performed Overall Cognitive Status: Within Functional Limits for tasks assessed                                          Exercises Other Exercises Other Exercises: Pt provided with reinforcement of prior education on back precautions, falls prevention, ADL transfer training, and AE/DME forADL    Shoulder Instructions       General Comments      Pertinent Vitals/ Pain       Pain Assessment Pain Score: 3  Pain Location: thoracic spine Pain Descriptors / Indicators: Grimacing, Guarding, Discomfort Pain Intervention(s): Limited activity within patient's tolerance, Monitored during session, Repositioned  Home Living                                          Prior Functioning/Environment              Frequency           Progress Toward Goals  OT Goals(current goals can now be found in the care plan section)  Progress towards OT goals: Progressing toward goals  Acute Rehab OT Goals Patient Stated Goal: to get stronger and have less pain OT Goal Formulation: With patient Time For Goal Achievement: 10/04/21 Potential to Achieve Goals: Good  Plan Discharge plan remains appropriate;Frequency remains appropriate    Co-evaluation    PT/OT/SLP Co-Evaluation/Treatment: Yes Reason for Co-Treatment: Complexity of the patient's impairments (multi-system involvement);For patient/therapist safety;To address functional/ADL transfers PT goals addressed during session: Mobility/safety with mobility;Balance OT goals addressed during session:  ADL's and self-care;Proper use of Adaptive equipment and DME      AM-PAC OT "6 Clicks" Daily Activity     Outcome Measure   Help from another person eating meals?: None Help from another person taking care of personal grooming?: None Help from another person toileting, which includes using toliet, bedpan, or urinal?: A Lot Help from another person bathing (including washing, rinsing, drying)?: A Lot Help from another person to put on and taking off regular upper body clothing?: A Little Help from another person to put on and taking off regular lower body clothing?: A Lot 6 Click Score: 17    End of Session Equipment Utilized During Treatment: Rolling walker (2 wheels)  OT Visit Diagnosis: Other abnormalities of gait and mobility (R26.89);Muscle weakness (generalized) (M62.81);Pain Pain - Right/Left:  (back)   Activity Tolerance Patient tolerated treatment well   Patient Left in chair;with call bell/phone within reach;Other (comment)   Nurse Communication          [id number] OT Time Calculation (min): 29 min  Charges: OT General Charges $OT Visit: 1 Visit OT Treatments $Self Care/Home Management : 8-22 mins  Jasaiah Karwowski  Roosvelt Maser M.S., OTR/L [facility] - [facility] Ascom: [phone] 09/21/21, 3:24 PM

## 2021-09-21 NOTE — TOC Progression Note (Signed)
Transition of Care [facility]) - Progression Note    Patient Details  Name: Diana Green MRN: [id number] Date of Birth: Aug 30, 1950  Transition of Care [facility]) CM/SW Contact  Marlowe Sax, RN Phone Number: 09/21/2021, 1:35 PM  Clinical Narrative:   The patient was offered a bed at [facility] and accepted the offer, [facility] has started the ins process with [facility]          Expected Discharge Plan and Services                                                 Social Determinants of Health (SDOH) Interventions    Readmission Risk Interventions No flowsheet data found.

## 2021-09-22 NOTE — Progress Notes (Signed)
° °   Attending Progress Note  History: Diana Green is s/p T11-12 decompression and fusion   POD3: continues to describe soreness in her legs which improves with activity.  She also is concerned regarding some swelling in her left hand around her IV.  POD2: soreness in legs yesterday. No new or worsening symptoms   POD1: Pt reports new tremors in hands post-op. She remains conscious during these episodes and they same to worsen with goal orientated tasks. Continued decreased sensation in hands and feet which is baseline. Reports soreness in her back but this is largely controlled with current pain medications  Physical Exam: Vitals:   09/22/21 0356 09/22/21 0803  BP: (!) 111/55 110/64  Pulse: 64 69  Resp: 16 16  Temp: 97.8 F (36.6 C) 98.3 F (36.8 C)  SpO2: 96% 99%    AA Ox3 CNI Strength:5/5 throughout  Incision covered with wound vac HV output 0 overnight   Data:  Recent Labs  Lab 09/19/21 1828 09/21/21 0814  NA 137 139  K 4.0 3.9  CL 104 106  CO2 25 28  BUN 23 30*  CREATININE 1.18* 1.27*  GLUCOSE 134* 115*  CALCIUM 9.7 9.3    No results for input(s): AST, ALT, ALKPHOS in the last 168 hours.  Invalid input(s): TBILI   Recent Labs  Lab 09/20/21 1231  WBC 15.1*  HGB 9.9*  HCT 30.7*  PLT 231    No results for input(s): APTT, INR in the last 168 hours.       Assessment/Plan:  Diana Green is a 71 y.o presenting with thoracic myelopathy s/p T11-12 decompression and fusion on 09/19/21  - mobilize - pain control - DVT prophylaxis - PTOT - continue wound vac. Will remove prior to discharge - HV removed this morning - dispo planning underway. Plans for discharge to SNF. Patient is medically stable for discharge. Awaiting bed approval  Manning Charity PA-C Department of Neurosurgery

## 2021-09-22 NOTE — Progress Notes (Signed)
Occupational Therapy Treatment Patient Details Name: Diana Green MRN: [id number] DOB: 1950/10/09 Today's Date: 09/22/2021   History of present illness 71yo presenting with thoracic myelopathy s/p T11-12 decompression and fusion on 09/19/21. Significant PMH includes: cervical myelopathy, C3-4 cervical diskectomy and fusion on 07/08/21, arthritis, depression, HLD, and HTN.   OT comments  Pt seen for OT tx this date. 3/10 back pain at rest, increasing to 4/10 with ADL transfers and toileting. Pt required MIN A + VC for log roll technique to get to EOB. Once EOB, pt required increasingly elevated bed for standing attempts, and ultimately unable with 1 person, requiring MOD-MAX A +2 to stand, once in standing CGA+2 to step pivot to [facility]. Pt able to stand with CGA and UE support on the RW and set up for wipe for pericare without LOB. Pt left in recliner, all needs in reach. With cue for acronym, pt able to recall 3/4 back precautions. Pt continues to benefit from skilled OT services. Continue to recommend SNF.    Recommendations for follow up therapy are one component of a multi-disciplinary discharge planning process, led by the attending physician.  Recommendations may be updated based on patient status, additional functional criteria and insurance authorization.    Follow Up Recommendations  Skilled nursing-short term rehab (<3 hours/day)    Assistance Recommended at Discharge Intermittent Supervision/Assistance  Patient can return home with the following  Two people to help with walking and/or transfers;A lot of help with bathing/dressing/bathroom;Assistance with cooking/housework;Assist for transportation;Help with stairs or ramp for entrance;A lot of help with walking and/or transfers;Two people to help with bathing/dressing/bathroom   Equipment Recommendations  BSC/3in1;Tub/shower seat;Other (comment) (reacher, LH sponge)    Recommendations for Other Services      Precautions / Restrictions  Precautions Precautions: Back;Fall Precaution Booklet Issued: Yes (comment) Precaution Comments: BLTA - pt able to recall 3/4 (missed arching) Restrictions Weight Bearing Restrictions: No Other Position/Activity Restrictions: no brace       Mobility Bed Mobility Overal bed mobility: Needs Assistance Bed Mobility: Rolling, Sidelying to Sit Rolling: Min assist Sidelying to sit: Min assist       General bed mobility comments: VC technique for log roll    Transfers Overall transfer level: Needs assistance Equipment used: Rolling walker (2 wheels) Transfers: Sit to/from Stand Sit to Stand: Max assist, +2 physical assistance, Mod assist, From elevated surface     Step pivot transfers: Min assist, +2 safety/equipment     General transfer comment: VC for sequencing/safety     Balance Overall balance assessment: Needs assistance Sitting-balance support: Bilateral upper extremity supported, Feet supported, Single extremity supported Sitting balance-Leahy Scale: Good     Standing balance support: Bilateral upper extremity supported, During functional activity, Single extremity supported Standing balance-Leahy Scale: Fair Standing balance comment: Briefly able to static stand with 1 UE support during peri-care.                           ADL either performed or assessed with clinical judgement   ADL Overall ADL's : Needs assistance/impaired                         Toilet Transfer: +2 for physical assistance;Minimal assistance;Rolling walker (2 wheels);BSC/3in1;Cueing for sequencing   Toileting- Clothing Manipulation and Hygiene: Sit to/from stand;Min guard;Set up       Functional mobility during ADLs: Min guard;Minimal assistance;+2 for physical assistance;Rolling walker (2 wheels)  Extremity/Trunk Assessment              Vision       Perception     Praxis      Cognition Arousal/Alertness: Awake/alert Behavior During Therapy: WFL  for tasks assessed/performed Overall Cognitive Status: Within Functional Limits for tasks assessed                                          Exercises      Shoulder Instructions       General Comments      Pertinent Vitals/ Pain       Pain Assessment Pain Assessment: 0-10 Pain Score: 4  Pain Location: thoracic spine Pain Descriptors / Indicators: Grimacing, Guarding, Discomfort Pain Intervention(s): Limited activity within patient's tolerance, Monitored during session, Premedicated before session, Repositioned  Home Living                                          Prior Functioning/Environment              Frequency  Min 2X/week        Progress Toward Goals  OT Goals(current goals can now be found in the care plan section)  Progress towards OT goals: Progressing toward goals  Acute Rehab OT Goals Patient Stated Goal: to get stronger and have less pain OT Goal Formulation: With patient Time For Goal Achievement: 10/04/21 Potential to Achieve Goals: Good  Plan Discharge plan remains appropriate;Frequency remains appropriate    Co-evaluation                 AM-PAC OT "6 Clicks" Daily Activity     Outcome Measure   Help from another person eating meals?: None Help from another person taking care of personal grooming?: None Help from another person toileting, which includes using toliet, bedpan, or urinal?: A Lot Help from another person bathing (including washing, rinsing, drying)?: A Lot Help from another person to put on and taking off regular upper body clothing?: A Little Help from another person to put on and taking off regular lower body clothing?: A Lot 6 Click Score: 17    End of Session Equipment Utilized During Treatment: Rolling walker (2 wheels)  OT Visit Diagnosis: Other abnormalities of gait and mobility (R26.89);Muscle weakness (generalized) (M62.81);Pain Pain - Right/Left:  (T spine)   Activity  Tolerance Patient tolerated treatment well   Patient Left in chair;with call bell/phone within reach   Nurse Communication          Time: [id number] OT Time Calculation (min): 25 min  Charges: OT General Charges $OT Visit: 1 Visit OT Treatments $Self Care/Home Management : 23-37 mins  Ardeth Perfect., MPH, MS, OTR/L ascom [phone] 09/22/21, 1:03 PM

## 2021-09-22 NOTE — TOC Progression Note (Signed)
Transition of Care [facility]) - Progression Note    Patient Details  Name: Diana Green MRN: [id number] Date of Birth: [birth year]  Transition of Care [facility]) CM/SW Contact  Marlowe Sax, RN Phone Number: 09/22/2021, 2:34 PM  Clinical Narrative:   Reviewed th ebed offers with the aptient again, she chose [facility] in [place], I reached out to [facility] and they will start the Ins process to obtain approval         Expected Discharge Plan and Services                                                 Social Determinants of Health (SDOH) Interventions    Readmission Risk Interventions No flowsheet data found.

## 2021-09-22 NOTE — Care Management Important Message (Signed)
Important Message  Patient Details  Name: Diana Green MRN: [id number] Date of Birth: 1951/07/03   Medicare Important Message Given:  N/A - LOS <3 / Initial given by admissions     Olegario Messier A Shammond Arave 09/22/2021, 8:55 AM

## 2021-09-22 NOTE — Progress Notes (Signed)
Physical Therapy Treatment Patient Details Name: Diana Green MRN: [id number] DOB: Jan 03, 1951 Today's Date: 09/22/2021   History of Present Illness 70yo presenting with thoracic myelopathy s/p T11-12 decompression and fusion on 09/19/21. Significant PMH includes: cervical myelopathy, C3-4 cervical diskectomy and fusion on 07/08/21, arthritis, depression, HLD, and HTN.    PT Comments    Pt was sitting in recliner upon arriving. She agrees to PT session and is cooperative and pleasant throughout. Wound vac (prevena)  making loud noise and per RN, MD is aware. Pt continues to required extensive assistance to stand from standard surface heights. Needs mod assistance  + vcs for safety to achieve standing. Once standing, pt was able to ambulate with RW with CGA only. Tolerated gait ~ 100 ft total with one standing rest break. Returned to room and pt sat on BSC to urinate. Min assist to stand from higher BSC surface. Pt then required mod assist to progress from short sitting EOB > supine in bed. Vcs for adhering to spinal precautions throughout. Overall pt is progressing. Her spouse is currently admitted in hospital as well and she does not have assistance at this time for DC. Author recommends DC to SNF to address deficits while maximizing independence with ADLs.    Recommendations for follow up therapy are one component of a multi-disciplinary discharge planning process, led by the attending physician.  Recommendations may be updated based on patient status, additional functional criteria and insurance authorization.  Follow Up Recommendations  Skilled nursing-short term rehab (<3 hours/day)     Assistance Recommended at Discharge Frequent or constant Supervision/Assistance  Patient can return home with the following A lot of help with walking and/or transfers;A lot of help with bathing/dressing/bathroom;Assistance with cooking/housework;Assist for transportation;Help with stairs or ramp for entrance    Equipment Recommendations  Rolling walker (2 wheels)       Precautions / Restrictions Precautions Precautions: Back;Fall Precaution Booklet Issued: Yes (comment) Precaution Comments: spinal precautions Restrictions Weight Bearing Restrictions: No Other Position/Activity Restrictions: no brace     Mobility  Bed Mobility Overal bed mobility: Needs Assistance Bed Mobility: Rolling, Sidelying to Sit Rolling: Min assist Sidelying to sit: Min assist       General bed mobility comments: VC technique for log roll    Transfers Overall transfer level: Needs assistance Equipment used: Rolling walker (2 wheels) Transfers: Sit to/from Stand Sit to Stand: Mod assist           General transfer comment: Pt was able to stand from recliner to BRW    Ambulation/Gait Ambulation/Gait assistance: Min guard Gait Distance (Feet): 100 Feet Assistive device: Rolling walker (2 wheels) Gait Pattern/deviations: Decreased step length - right, Decreased step length - left, Decreased stride length, Step-to pattern Gait velocity: decreased     Balance Overall balance assessment: Needs assistance Sitting-balance support: Bilateral upper extremity supported, Feet supported, Single extremity supported Sitting balance-Leahy Scale: Good     Standing balance support: Bilateral upper extremity supported, During functional activity, Single extremity supported Standing balance-Leahy Scale: Fair      Cognition Arousal/Alertness: Awake/alert Behavior During Therapy: WFL for tasks assessed/performed Overall Cognitive Status: Within Functional Limits for tasks assessed             Pertinent Vitals/Pain Pain Assessment Pain Assessment: 0-10 Pain Score: 4  Pain Location: thoracic spine Pain Descriptors / Indicators: Grimacing, Guarding, Discomfort Pain Intervention(s): Limited activity within patient's tolerance, Monitored during session, Premedicated before session, Repositioned     PT  Goals (current goals can  now be found in the care plan section) Acute Rehab PT Goals Patient Stated Goal: " get to rehab then get home." Progress towards PT goals: Progressing toward goals    Frequency    BID      PT Plan Current plan remains appropriate    Co-evaluation     PT goals addressed during session: Mobility/safety with mobility;Balance;Strengthening/ROM;Proper use of DME        AM-PAC PT "6 Clicks" Mobility   Outcome Measure  Help needed turning from your back to your side while in a flat bed without using bedrails?: A Lot Help needed moving from lying on your back to sitting on the side of a flat bed without using bedrails?: A Lot Help needed moving to and from a bed to a chair (including a wheelchair)?: A Lot Help needed standing up from a chair using your arms (e.g., wheelchair or bedside chair)?: A Lot Help needed to walk in hospital room?: A Little Help needed climbing 3-5 steps with a railing? : A Lot 6 Click Score: 13    End of Session Equipment Utilized During Treatment: Gait belt Activity Tolerance: Patient tolerated treatment well;No increased pain;Patient limited by fatigue Patient left: in chair;with call bell/phone within reach;with chair alarm set;with nursing/sitter in room Nurse Communication: Mobility status PT Visit Diagnosis: Unsteadiness on feet (R26.81);Pain;Muscle weakness (generalized) (M62.81)     Time: 0865-7846 PT Time Calculation (min) (ACUTE ONLY): 28 min  Charges:  $Gait Training: 8-22 mins $Therapeutic Activity: 8-22 mins                     Jetta Lout PTA 09/22/21, 4:44 PM

## 2021-09-22 NOTE — Plan of Care (Signed)

## 2021-09-22 NOTE — Progress Notes (Signed)
Physical Therapy Treatment Patient Details Name: Diana Green MRN: [id number] DOB: 11-24-1950 Today's Date: 09/22/2021   History of Present Illness 71yo presenting with thoracic myelopathy s/p T11-12 decompression and fusion on 09/19/21. Significant PMH includes: cervical myelopathy, C3-4 cervical diskectomy and fusion on 07/08/21, arthritis, depression, HLD, and HTN.    PT Comments    Pt awake and alert in recliner w/ nurse tech in room providing sponge bath. Nurse tech requested PT assistance w/ sit to stand in order to finish Pt's sponge bath. She performed sit to stand w/ modAx1 and using bariatric RW. Pt tolerated standing for ~5 minutes while nurse tech finished and was able to maintain unilateral UE support on RW for standing balance w/ pericare need. She was able to ambulate ~17ft w/ CGA and using BRW. She does require continued cues and reminders about spinal precautions, especially w/ transfers and pericare. Pt will benefit from continued skilled PT in order to improve LE strength, mobility, gait, and restore PLOF. Current discharge recommendation remains appropriate due to the level of assistance required by the patient to ensure safety and improve overall function.   Recommendations for follow up therapy are one component of a multi-disciplinary discharge planning process, led by the attending physician.  Recommendations may be updated based on patient status, additional functional criteria and insurance authorization.  Follow Up Recommendations  Skilled nursing-short term rehab (<3 hours/day)     Assistance Recommended at Discharge Frequent or constant Supervision/Assistance  Patient can return home with the following A lot of help with walking and/or transfers;A lot of help with bathing/dressing/bathroom;Assistance with cooking/housework;Assist for transportation;Help with stairs or ramp for entrance   Equipment Recommendations  Rolling walker (2 wheels)    Recommendations for  Other Services       Precautions / Restrictions Precautions Precautions: Back;Fall Precaution Booklet Issued: Yes (comment) Precaution Comments: BLTA Restrictions Weight Bearing Restrictions: No Other Position/Activity Restrictions: no brace     Mobility  Bed Mobility                    Transfers Overall transfer level: Needs assistance Equipment used: Rolling walker (2 wheels) Transfers: Sit to/from Stand Sit to Stand: Mod assist   Step pivot transfers: Mod assist       General transfer comment: Pt able to progress to +1 CGA for functional mobiltiy in room    Ambulation/Gait Ambulation/Gait assistance: Min guard Gait Distance (Feet): 85 Feet Assistive device: Rolling walker (2 wheels) Gait Pattern/deviations: Decreased step length - right, Decreased step length - left, Decreased stride length, Step-to pattern Gait velocity: decreased         Stairs             Wheelchair Mobility    Modified Rankin (Stroke Patients Only)       Balance Overall balance assessment: Needs assistance Sitting-balance support: Bilateral upper extremity supported, Feet supported Sitting balance-Leahy Scale: Good     Standing balance support: Bilateral upper extremity supported, During functional activity, Single extremity supported Standing balance-Leahy Scale: Fair Standing balance comment: Briefly able to static stand with 1 UE support during peri-care.                            Cognition Arousal/Alertness: Awake/alert Behavior During Therapy: WFL for tasks assessed/performed Overall Cognitive Status: Within Functional Limits for tasks assessed  Exercises      General Comments        Pertinent Vitals/Pain Pain Assessment Pain Assessment: 0-10 Pain Score: 3  Pain Location: thoracic spine Pain Descriptors / Indicators: Grimacing, Guarding, Discomfort Pain Intervention(s):  Limited activity within patient's tolerance, Monitored during session, Repositioned    Home Living                          Prior Function            PT Goals (current goals can now be found in the care plan section) Progress towards PT goals: Progressing toward goals    Frequency    BID      PT Plan Current plan remains appropriate    Co-evaluation              AM-PAC PT "6 Clicks" Mobility   Outcome Measure  Help needed turning from your back to your side while in a flat bed without using bedrails?: A Lot Help needed moving from lying on your back to sitting on the side of a flat bed without using bedrails?: A Lot Help needed moving to and from a bed to a chair (including a wheelchair)?: A Lot Help needed standing up from a chair using your arms (e.g., wheelchair or bedside chair)?: A Lot Help needed to walk in hospital room?: A Little Help needed climbing 3-5 steps with a railing? : A Lot 6 Click Score: 13    End of Session Equipment Utilized During Treatment: Gait belt Activity Tolerance: Patient tolerated treatment well;No increased pain;Patient limited by fatigue Patient left: in chair;with call bell/phone within reach;with chair alarm set;with nursing/sitter in room (Nurse tech in room finishing up sponge bath care) Nurse Communication: Mobility status PT Visit Diagnosis: Unsteadiness on feet (R26.81);Pain;Muscle weakness (generalized) (M62.81) Pain - Right/Left:  (midline) Pain - part of body:  (thoracic spine)     Time: 3825-0539 PT Time Calculation (min) (ACUTE ONLY): 16 min  Charges:                        Jeralyn Bennett, SPT 09/22/2021, 11:46 AM

## 2021-09-22 NOTE — TOC Progression Note (Signed)
Transition of Care [facility]) - Progression Note    Patient Details  Name: Diana Green MRN: [id number] Date of Birth: 07/15/1951  [facility]) CM/SW Contact  Marlowe Sax, RN Phone Number: 09/22/2021, 1:14 PM  Clinical Narrative:    Reviewed each of the bed offers with the patient along with medicare star rating and address of each facility as well as distance from [facility] I provided these written down to review with her family, I will return to get her choice once she has reviewed the facilities with her family Her family is coming to the hospital soon        Expected Discharge Plan and Services                                                 Social Determinants of Health (SDOH) Interventions    Readmission Risk Interventions No flowsheet data found.

## 2021-09-23 NOTE — Progress Notes (Signed)
Occupational Therapy Treatment Patient Details Name: Diana Green MRN: [id number] DOB: 07/26/1951 Today's Date: 09/23/2021   History of present illness 71yo presenting with thoracic myelopathy s/p T11-12 decompression and fusion on 09/19/21. Significant PMH includes: cervical myelopathy, C3-4 cervical diskectomy and fusion on 07/08/21, arthritis, depression, HLD, and HTN.   OT comments  Pt seen for OT tx this date. Pt reports just returning to bed recently and politely declines EOB/OOB for session, but agreeable to bed level and requesting instruction in compression stocking mgt. Pt instructed in compression stocking mgt with visual demonstration to doff then don stocking on LLE. Pt verbalized understanding of sequencing afterwards and appreciative of the instruction so she could instruct caregivers in the future. Pt progressing towards goals, however, continues to benefit from skilled OT Services and continue to recommend SNF.    Recommendations for follow up therapy are one component of a multi-disciplinary discharge planning process, led by the attending physician.  Recommendations may be updated based on patient status, additional functional criteria and insurance authorization.    Follow Up Recommendations  Skilled nursing-short term rehab (<3 hours/day)    Assistance Recommended at Discharge Intermittent Supervision/Assistance  Patient can return home with the following  A lot of help with bathing/dressing/bathroom;Two people to help with walking and/or transfers;Assistance with cooking/housework;Help with stairs or ramp for entrance;Assist for transportation   Equipment Recommendations  BSC/3in1;Tub/shower seat;Other (comment) (reacher, LH sponge)    Recommendations for Other Services      Precautions / Restrictions Precautions Precautions: Back;Fall Precaution Booklet Issued: Yes (comment) Precaution Comments: spinal precautions Restrictions Weight Bearing Restrictions: No Other  Position/Activity Restrictions: no brace       Mobility Bed Mobility               General bed mobility comments: MAX +2 to scoot up in bed for optimal positioning for comfort and back pain with nurse tech. Pt declined EOB/OOB, as she just recently returned to bed.    Transfers                         Balance                                           ADL either performed or assessed with clinical judgement   ADL Overall ADL's : Needs assistance/impaired                                       General ADL Comments: Pt continues to require significant assist for ADL transfers and LB ADL tasks. MAX A for donning compression stockings.    Extremity/Trunk Assessment              Vision       Perception     Praxis      Cognition Arousal/Alertness: Awake/alert Behavior During Therapy: WFL for tasks assessed/performed Overall Cognitive Status: Within Functional Limits for tasks assessed                                          Exercises Other Exercises Other Exercises: Pt instructed in compression stocking mgt with visual demonstration to doff then don stocking on LLE. Pt  verbalized understanding of sequencing afterwards and appreciative of the instruction so she could instruct caregivers in the future.    Shoulder Instructions       General Comments      Pertinent Vitals/ Pain          Home Living                                          Prior Functioning/Environment              Frequency  Min 2X/week        Progress Toward Goals  OT Goals(current goals can now be found in the care plan section)  Progress towards OT goals: Progressing toward goals  Acute Rehab OT Goals Patient Stated Goal: to get stronger and have less pain OT Goal Formulation: With patient Time For Goal Achievement: 10/04/21 Potential to Achieve Goals: Good  Plan Discharge plan remains  appropriate;Frequency remains appropriate    Co-evaluation        PT goals addressed during session: Mobility/safety with mobility        AM-PAC OT "6 Clicks" Daily Activity     Outcome Measure   Help from another person eating meals?: None Help from another person taking care of personal grooming?: None Help from another person toileting, which includes using toliet, bedpan, or urinal?: A Lot Help from another person bathing (including washing, rinsing, drying)?: A Lot Help from another person to put on and taking off regular upper body clothing?: A Little Help from another person to put on and taking off regular lower body clothing?: A Lot 6 Click Score: 17    End of Session    OT Visit Diagnosis: Other abnormalities of gait and mobility (R26.89);Muscle weakness (generalized) (M62.81);Pain Pain - Right/Left:  (back)   Activity Tolerance Patient tolerated treatment well   Patient Left in bed;with call bell/phone within reach;with bed alarm set;with SCD's reapplied   Nurse Communication          Time: [id number] OT Time Calculation (min): 18 min  Charges: OT General Charges $OT Visit: 1 Visit OT Treatments $Self Care/Home Management : 8-22 mins  Ardeth Perfect., MPH, MS, OTR/L ascom [phone] 09/23/21, 4:28 PM

## 2021-09-23 NOTE — Progress Notes (Signed)
° °   Attending Progress Note  History: Diana Green is s/p T11-12 decompression and fusion   POD4: complaining of constipation this morning. Was on the bedside commode.  POD3: continues to describe soreness in her legs which improves with activity.  She also is concerned regarding some swelling in her left hand around her IV.  POD2: soreness in legs yesterday. No new or worsening symptoms   POD1: Pt reports new tremors in hands post-op. She remains conscious during these episodes and they same to worsen with goal orientated tasks. Continued decreased sensation in hands and feet which is baseline. Reports soreness in her back but this is largely controlled with current pain medications  Physical Exam: Vitals:   09/22/21 2020 09/23/21 0745  BP: 114/64 114/75  Pulse: 70 74  Resp: 16 18  Temp: 99.1 F (37.3 C) 98.3 F (36.8 C)  SpO2: 100% 97%    AA Ox3 CNI Strength:5/5 throughout  Incision covered with wound vac  Data:  Recent Labs  Lab 09/19/21 1828 09/21/21 0814  NA 137 139  K 4.0 3.9  CL 104 106  CO2 25 28  BUN 23 30*  CREATININE 1.18* 1.27*  GLUCOSE 134* 115*  CALCIUM 9.7 9.3    No results for input(s): AST, ALT, ALKPHOS in the last 168 hours.  Invalid input(s): TBILI   Recent Labs  Lab 09/20/21 1231  WBC 15.1*  HGB 9.9*  HCT 30.7*  PLT 231    No results for input(s): APTT, INR in the last 168 hours.       Assessment/Plan:  Diana Green is a 71 y.o presenting with thoracic myelopathy s/p T11-12 decompression and fusion on 09/19/21  - mobilize - pain control - DVT prophylaxis - PTOT - continue wound vac. Will remove this afternoon - dispo planning underway. Plans for discharge to SNF. Patient is medically stable for discharge. Awaiting bed approval  Manning Charity PA-C Department of Neurosurgery

## 2021-09-23 NOTE — Care Management Important Message (Signed)
Important Message  Patient Details  Name: ANADALAY PELAN MRN: [id number] Date of Birth: Dec 31, 1950   Medicare Important Message Given:  Yes     Juliann Pulse A Keinan Brouillet 09/23/2021, 12:02 PM

## 2021-09-23 NOTE — TOC Progression Note (Signed)
Transition of Care [facility]) - Progression Note    Patient Details  Name: Diana Green MRN: [id number] Date of Birth: February 21, 1951  Transition of Care [facility]) CM/SW Contact  Marlowe Sax, RN Phone Number: 09/23/2021, 12:33 PM  Clinical Narrative:   Followed up on Ins auth, still pending         Expected Discharge Plan and Services                                                 Social Determinants of Health (SDOH) Interventions    Readmission Risk Interventions No flowsheet data found.

## 2021-09-23 NOTE — Progress Notes (Signed)
Physical Therapy Treatment Patient Details Name: Diana Green MRN: [id number] DOB: [birth year] Today's Date: 09/23/2021   History of Present Illness 70yo presenting with thoracic myelopathy s/p T11-12 decompression and fusion on 09/19/21. Significant PMH includes: cervical myelopathy, C3-4 cervical diskectomy and fusion on 07/08/21, arthritis, depression, HLD, and HTN.    PT Comments    Pt was long sitting in bed. She is A and O x 4 and continues to be very pleasant an cooperative. She reports unsuccessful enema earlier in the morning. Requested to attempt OOB to [facility]. Did have large successful BM during session. She continues to require extensive assistance to safely exit bed and stand. CGA for short distance ambulation. Overall pt is improving. She will greatly benefit from SNF at [place] to improve independence with ADLs prior to returning home.      Recommendations for follow up therapy are one component of a multi-disciplinary discharge planning process, led by the attending physician.  Recommendations may be updated based on patient status, additional functional criteria and insurance authorization.  Follow Up Recommendations  Skilled nursing-short term rehab (<3 hours/day)     Assistance Recommended at Discharge Frequent or constant Supervision/Assistance  Patient can return home with the following A lot of help with walking and/or transfers;A lot of help with bathing/dressing/bathroom;Assistance with cooking/housework;Assist for transportation;Help with stairs or ramp for entrance   Equipment Recommendations  Rolling walker (2 wheels)       Precautions / Restrictions Precautions Precautions: Back;Fall Precaution Booklet Issued: Yes (comment) Precaution Comments: spinal precautions Restrictions Weight Bearing Restrictions: No     Mobility  Bed Mobility Overal bed mobility: Needs Assistance Bed Mobility: Rolling, Sidelying to Sit Rolling: Min assist Sidelying to sit: Min assist, Mod  assist       General bed mobility comments: Pt requires increased time and min-mod assist to exit L side of bed    Transfers Overall transfer level: Needs assistance Equipment used: Rolling walker (2 wheels) Transfers: Sit to/from Stand Sit to Stand: Min assist, Mod assist    General transfer comment: Pt required min assist to stand from elevated surface heights however mod assist from standard height    Ambulation/Gait Ambulation/Gait assistance: Min guard Gait Distance (Feet): 12 Feet Assistive device: Rolling walker (2 wheels) Gait Pattern/deviations: Decreased step length - right, Decreased step length - left, Decreased stride length, Step-to pattern Gait velocity: decreased     General Gait Details: Pt was able to ambulate 12 ft 2 x. Ambulated to [facility] then to recliner. RN made aware pt did have successful BM.    Balance Overall balance assessment: Needs assistance Sitting-balance support: Bilateral upper extremity supported, Feet supported, Single extremity supported Sitting balance-Leahy Scale: Good     Standing balance support: Bilateral upper extremity supported, During functional activity, Single extremity supported Standing balance-Leahy Scale: Fair       Cognition Arousal/Alertness: Awake/alert Behavior During Therapy: WFL for tasks assessed/performed Overall Cognitive Status: Within Functional Limits for tasks assessed    General Comments: Pt is A and O x 4               Pertinent Vitals/Pain Pain Assessment Pain Assessment: 0-10 Pain Score: 3  Pain Location: thoracic spine Pain Descriptors / Indicators: Grimacing, Guarding, Discomfort Pain Intervention(s): Limited activity within patient's tolerance, Monitored during session, Premedicated before session, Repositioned     PT Goals (current goals can now be found in the care plan section) Acute Rehab PT Goals Patient Stated Goal: " get to rehab then get home."  Progress towards PT goals:  Progressing toward goals    Frequency    BID      PT Plan Current plan remains appropriate    Co-evaluation     PT goals addressed during session: Mobility/safety with mobility;Balance;Proper use of DME;Strengthening/ROM        AM-PAC PT "6 Clicks" Mobility   Outcome Measure  Help needed turning from your back to your side while in a flat bed without using bedrails?: A Lot Help needed moving from lying on your back to sitting on the side of a flat bed without using bedrails?: A Lot Help needed moving to and from a bed to a chair (including a wheelchair)?: A Lot Help needed standing up from a chair using your arms (e.g., wheelchair or bedside chair)?: A Lot Help needed to walk in hospital room?: A Little Help needed climbing 3-5 steps with a railing? : A Lot 6 Click Score: 13    End of Session Equipment Utilized During Treatment: Gait belt Activity Tolerance: Patient tolerated treatment well;No increased pain;Patient limited by fatigue Patient left: in chair;with call bell/phone within reach;with chair alarm set;with nursing/sitter in room Nurse Communication: Mobility status PT Visit Diagnosis: Unsteadiness on feet (R26.81);Pain;Muscle weakness (generalized) (M62.81)     Time: 9628-3662 PT Time Calculation (min) (ACUTE ONLY): 27 min  Charges:  $Gait Training: 8-22 mins $Therapeutic Activity: 8-22 mins                    Jetta Lout PTA 09/23/21, 10:26 AM

## 2021-09-23 NOTE — Progress Notes (Signed)
Physical Therapy Treatment Patient Details Name: Diana Green MRN: [id number] DOB: 1950-11-27 Today's Date: 09/23/2021   History of Present Illness 71yo presenting with thoracic myelopathy s/p T11-12 decompression and fusion on 09/19/21. Significant PMH includes: cervical myelopathy, C3-4 cervical diskectomy and fusion on 07/08/21, arthritis, depression, HLD, and HTN.    PT Comments    Pt was still sitting in recliner upon arriving. She agrees to session and is eager to get back into bed after session. Pt continues to require extensive assistance to stand form standard height surfaces. Mod-max of one to stand from recliner. Once in standing, pt only required CGA to ambulate. Pt struggles to get in/out of bed but did adhere to spinal precautions during. Mod assist to short sit> supine with increased time and lots of vcs. SNF still most appropriate DC disposition for safety. She will continue to benefit from skilled PT at DC to address deficits while maximizing independence with ADLs.    Recommendations for follow up therapy are one component of a multi-disciplinary discharge planning process, led by the attending physician.  Recommendations may be updated based on patient status, additional functional criteria and insurance authorization.  Follow Up Recommendations  Skilled nursing-short term rehab (<3 hours/day)     Assistance Recommended at Discharge Frequent or constant Supervision/Assistance  Patient can return home with the following A lot of help with walking and/or transfers;A lot of help with bathing/dressing/bathroom;Assistance with cooking/housework;Assist for transportation;Help with stairs or ramp for entrance   Equipment Recommendations  Rolling walker (2 wheels)       Precautions / Restrictions Precautions Precautions: Back;Fall Precaution Booklet Issued: Yes (comment) Precaution Comments: spinal precautions Restrictions Weight Bearing Restrictions: No     Mobility  Bed  Mobility Overal bed mobility: Needs Assistance Bed Mobility: Sit to Sidelying, Sit to Supine Rolling: Min assist Sidelying to sit: Mod assist   Sit to supine: Mod assist   General bed mobility comments: Pt requires increased time and min-mod assist to exit L side of bed    Transfers Overall transfer level: Needs assistance Equipment used: Rolling walker (2 wheels) Transfers: Sit to/from Stand Sit to Stand: Mod assist         General transfer comment: mod assist to stand from recliner height surface. Vcs for improved technique. more assistance in after than earlier in the day    Ambulation/Gait Ambulation/Gait assistance: Min guard Gait Distance (Feet): 120 Feet Assistive device: Rolling walker (2 wheels) Gait Pattern/deviations: Decreased step length - right, Decreased step length - left, Decreased stride length, Step-to pattern Gait velocity: decreased     General Gait Details: Pt was able to ambulate 120 ft with RW without LOB. she continues to have slow antalgic gait kinematics      Balance Overall balance assessment: Needs assistance Sitting-balance support: Bilateral upper extremity supported, Feet supported, Single extremity supported Sitting balance-Leahy Scale: Good     Standing balance support: Bilateral upper extremity supported, During functional activity, Single extremity supported Standing balance-Leahy Scale: Fair           Cognition Arousal/Alertness: Awake/alert Behavior During Therapy: WFL for tasks assessed/performed Overall Cognitive Status: Within Functional Limits for tasks assessed      General Comments: Pt is A and O x 4               Pertinent Vitals/Pain Pain Assessment Pain Assessment: No/denies pain Pain Score: 0-No pain Pain Location: thoracic spine Pain Descriptors / Indicators: Grimacing, Guarding, Discomfort Pain Intervention(s): Limited activity within patient's tolerance, Monitored  during session, Premedicated before  session, Repositioned     PT Goals (current goals can now be found in the care plan section) Acute Rehab PT Goals Patient Stated Goal: " get to rehab then get home." Progress towards PT goals: Progressing toward goals    Frequency    BID      PT Plan Current plan remains appropriate    Co-evaluation     PT goals addressed during session: Mobility/safety with mobility        AM-PAC PT "6 Clicks" Mobility   Outcome Measure  Help needed turning from your back to your side while in a flat bed without using bedrails?: A Lot Help needed moving from lying on your back to sitting on the side of a flat bed without using bedrails?: A Lot Help needed moving to and from a bed to a chair (including a wheelchair)?: A Lot Help needed standing up from a chair using your arms (e.g., wheelchair or bedside chair)?: A Lot Help needed to walk in hospital room?: A Little Help needed climbing 3-5 steps with a railing? : A Lot 6 Click Score: 13    End of Session Equipment Utilized During Treatment: Gait belt Activity Tolerance: Patient tolerated treatment well;No increased pain;Patient limited by fatigue Patient left: in chair;with call bell/phone within reach;with chair alarm set;with nursing/sitter in room Nurse Communication: Mobility status PT Visit Diagnosis: Unsteadiness on feet (R26.81);Pain;Muscle weakness (generalized) (M62.81)     Time: 1340-1404 PT Time Calculation (min) (ACUTE ONLY): 24 min  Charges:  $Gait Training: 8-22 mins $Therapeutic Activity: 8-22 mins                     Jetta Lout PTA 09/23/21, 3:30 PM

## 2021-09-24 MED ORDER — CALCIUM CARBONATE ANTACID 500 MG PO CHEW
1.0000 | CHEWABLE_TABLET | Freq: Four times a day (QID) | ORAL | Status: DC
  Administered 2021-09-24 – 2021-09-26 (×7): 200 mg via ORAL
  Filled 2021-09-24 (×7): qty 1

## 2021-09-24 MED ORDER — ALUM & MAG HYDROXIDE-SIMETH 200-200-20 MG/5ML PO SUSP
15.0000 mL | ORAL | Status: DC | PRN
  Administered 2021-09-24 – 2021-09-25 (×3): 15 mL via ORAL
  Filled 2021-09-24 (×3): qty 30

## 2021-09-24 MED ORDER — CALCIUM CARBONATE ANTACID 500 MG PO CHEW: 1.0000 | CHEWABLE_TABLET | Freq: Three times a day (TID) | ORAL | Status: DC

## 2021-09-24 MED ORDER — CALCIUM CARBONATE ANTACID 500 MG PO CHEW
1.0000 | CHEWABLE_TABLET | Freq: Every day | ORAL | Status: DC
  Administered 2021-09-24: 200 mg via ORAL
  Filled 2021-09-24: qty 1

## 2021-09-24 NOTE — Progress Notes (Signed)
Physical Therapy Treatment Patient Details Name: Diana Green MRN: [id number] DOB: 04-Jun-1951 Today's Date: 09/24/2021   History of Present Illness 71yo presenting with thoracic myelopathy s/p T11-12 decompression and fusion on 09/19/21. Significant PMH includes: cervical myelopathy, C3-4 cervical diskectomy and fusion on 07/08/21, arthritis, depression, HLD, and HTN.    PT Comments    Pt was long sitting in bed upon arriving. She agrees to session and is cooperative and pleasant throughout. Does still continue to c/o stomach discomfort but it did not limit session progression. She continues to require extensive assistance to exit bed and stand. Once in standing she is able to ambulate with supervision. Slight knee buckling with transfers. Required min assist form elevated bed heights however mod-max assist from standard height chairs. She will require extensive PT going forward. Recommend DC to SNF to address deficits while maximizing independence with ADLs. Pt does not have enough assistance available at home to safely perform ADL. She will greatly benefit from rehab to address strength, mobility, and transfer deficits. Author will return for PM session.   Recommendations for follow up therapy are one component of a multi-disciplinary discharge planning process, led by the attending physician.  Recommendations may be updated based on patient status, additional functional criteria and insurance authorization.  Follow Up Recommendations  Skilled nursing-short term rehab (<3 hours/day)     Assistance Recommended at Discharge Frequent or constant Supervision/Assistance  Patient can return home with the following A lot of help with walking and/or transfers;A lot of help with bathing/dressing/bathroom;Assistance with cooking/housework;Assist for transportation;Help with stairs or ramp for entrance   Equipment Recommendations  BSC/3in1;Rolling walker (2 wheels) (Bariatric if able to qualify)        Precautions / Restrictions Precautions Precautions: Back;Fall Precaution Booklet Issued: Yes (comment) Precaution Comments: spinal precautions Restrictions Weight Bearing Restrictions: No     Mobility  Bed Mobility Overal bed mobility: Needs Assistance Bed Mobility: Sit to Sidelying, Sit to Supine Rolling: Min assist, Mod assist Sidelying to sit: Mod assist   Sit to supine: Mod assist   General bed mobility comments: Pt requires increased time and vcs for sequencing.    Transfers Overall transfer level: Needs assistance Equipment used: Rolling walker (2 wheels) Transfers: Sit to/from Stand Sit to Stand: Mod assist, Max assist           General transfer comment: pt can stand from elevated surface heights with min assist however due to BLE weakness/ slight buckling requires mod-max assist form standard surface heights. recommend RN staff have +2 assistance with standing from recliner.    Ambulation/Gait Ambulation/Gait assistance: Supervision Gait Distance (Feet): 50 Feet Assistive device: Rolling walker (2 wheels) Gait Pattern/deviations: Antalgic Gait velocity: decreased     General Gait Details: pt ambulate with slow cadence however is able to ambulate 50 ft with RW without LOB. pt struggles more so with bed mobility and transfers. Once on her feet she does not have knee buckling.     Balance Overall balance assessment: Needs assistance Sitting-balance support: Bilateral upper extremity supported, Feet supported, Single extremity supported Sitting balance-Leahy Scale: Good     Standing balance support: Bilateral upper extremity supported, During functional activity, Single extremity supported Standing balance-Leahy Scale: Fair        Cognition Arousal/Alertness: Awake/alert Behavior During Therapy: WFL for tasks assessed/performed Overall Cognitive Status: Within Functional Limits for tasks assessed    General Comments: Pt is A and O x 4  Pertinent Vitals/Pain Pain Assessment Pain Assessment: 0-10 Pain Score: 4  Pain Location: thoracic spine Pain Descriptors / Indicators: Grimacing, Guarding, Discomfort Pain Intervention(s): Limited activity within patient's tolerance, Monitored during session, Premedicated before session, Repositioned     PT Goals (current goals can now be found in the care plan section) Acute Rehab PT Goals Patient Stated Goal: " get to rehab then get home." Progress towards PT goals: Progressing toward goals    Frequency    BID      PT Plan Current plan remains appropriate    Co-evaluation     PT goals addressed during session: Mobility/safety with mobility;Balance;Proper use of DME;Strengthening/ROM        AM-PAC PT "6 Clicks" Mobility   Outcome Measure  Help needed turning from your back to your side while in a flat bed without using bedrails?: A Lot Help needed moving from lying on your back to sitting on the side of a flat bed without using bedrails?: A Lot Help needed moving to and from a bed to a chair (including a wheelchair)?: A Lot Help needed standing up from a chair using your arms (e.g., wheelchair or bedside chair)?: A Lot Help needed to walk in hospital room?: A Little Help needed climbing 3-5 steps with a railing? : A Lot 6 Click Score: 13    End of Session Equipment Utilized During Treatment: Gait belt Activity Tolerance: Patient tolerated treatment well;No increased pain Patient left: in chair;with call bell/phone within reach;with chair alarm set;with nursing/sitter in room Nurse Communication: Mobility status PT Visit Diagnosis: Unsteadiness on feet (R26.81);Pain;Muscle weakness (generalized) (M62.81)     Time: 7322-0254 PT Time Calculation (min) (ACUTE ONLY): 23 min  Charges:  $Gait Training: 8-22 mins $Therapeutic Activity: 8-22 mins                     Jetta Lout PTA 09/24/21, 10:49 AM

## 2021-09-24 NOTE — Progress Notes (Signed)
Physical Therapy Treatment Patient Details Name: Diana Green MRN: [id number] DOB: 08-25-1950 Today's Date: 09/24/2021   History of Present Illness 70yo presenting with thoracic myelopathy s/p T11-12 decompression and fusion on 09/19/21. Significant PMH includes: cervical myelopathy, C3-4 cervical diskectomy and fusion on 07/08/21, arthritis, depression, HLD, and HTN.    PT Comments    Pt was sitting in recliner upon arriving. She remains A and O x 4 and agreeable to session. Most of this session focused on improving STS from recliner. Pt continues to have weakness/ slight buckling in BLE. She performed STS from recliner + pillows 2 x and 2 x from recliner + pillows and blankets 2x. Pt struggles to be able to stand form standard heights due to weakness. Once in standing, Pt is able to ambulate without physical assistance. Limited gait distance due to fatigue and c/o of increased back "stiffness." Overall pt continues to be highly motivated and cooperative throughout P sessions. Acute PT still feels SNF at DC is most appropriate to improve transfer safety and improve pt's bed mobility prior to returning home.   Recommendations for follow up therapy are one component of a multi-disciplinary discharge planning process, led by the attending physician.  Recommendations may be updated based on patient status, additional functional criteria and insurance authorization.  Follow Up Recommendations  Skilled nursing-short term rehab (<3 hours/day)     Assistance Recommended at Discharge Frequent or constant Supervision/Assistance  Patient can return home with the following A lot of help with walking and/or transfers;A lot of help with bathing/dressing/bathroom;Assistance with cooking/housework;Assist for transportation;Help with stairs or ramp for entrance   Equipment Recommendations  BSC/3in1;Rolling walker (2 wheels)       Precautions / Restrictions Precautions Precautions: Back;Fall Precaution Booklet  Issued: Yes (comment) Precaution Comments: spinal precautions Restrictions Weight Bearing Restrictions: No     Mobility  Bed Mobility Overal bed mobility: Needs Assistance Bed Mobility: Sit to Sidelying, Sit to Supine Rolling: Min assist, Mod assist Sidelying to sit: Mod assist   Sit to supine: Mod assist   General bed mobility comments: in recliner pre/post session    Transfers Overall transfer level: Needs assistance Equipment used: Rolling walker (2 wheels) Transfers: Sit to/from Stand Sit to Stand: Mod assist, Min assist    General transfer comment: session focused on improving tranfer abilities. performed STS 4 x time from recliner surface. She performed with pillows under to elevated surface 2 x, lowered surface height to using blanckets to elevated surface 2 x prior to pt c/o increased back pain/soreness. Pt still struggling to stand from standard height surfaces and can not achieve standing without assistance. less knee buckling present overall however BLE weakness is observed during transfers    Ambulation/Gait Ambulation/Gait assistance: Supervision Gait Distance (Feet): 50 Feet Assistive device: Rolling walker (2 wheels) Gait Pattern/deviations: Antalgic Gait velocity: decreased     General Gait Details: Pt was able to safely ambulate another 50 ft however has flexed knees at times. BLE weakness noted and present throughout all mobility/transfers/gait    Balance Overall balance assessment: Needs assistance Sitting-balance support: Bilateral upper extremity supported, Feet supported, Single extremity supported Sitting balance-Leahy Scale: Good     Standing balance support: Bilateral upper extremity supported, During functional activity, Single extremity supported Standing balance-Leahy Scale: Fair        Cognition Arousal/Alertness: Awake/alert Behavior During Therapy: WFL for tasks assessed/performed Overall Cognitive Status: Within Functional Limits for  tasks assessed            General  Comments: Pt is A and O x 4               Pertinent Vitals/Pain Pain Assessment Pain Assessment: 0-10 Pain Score: 6  Pain Location: thoracic spine Pain Descriptors / Indicators: Grimacing, Guarding, Discomfort Pain Intervention(s): Limited activity within patient's tolerance, Monitored during session, Premedicated before session, Repositioned     PT Goals (current goals can now be found in the care plan section) Acute Rehab PT Goals Patient Stated Goal: " get to rehab then get home." Progress towards PT goals: Progressing toward goals    Frequency    BID      PT Plan Current plan remains appropriate    Co-evaluation     PT goals addressed during session: Mobility/safety with mobility;Balance;Proper use of DME;Strengthening/ROM        AM-PAC PT "6 Clicks" Mobility   Outcome Measure  Help needed turning from your back to your side while in a flat bed without using bedrails?: A Little Help needed moving from lying on your back to sitting on the side of a flat bed without using bedrails?: A Lot Help needed moving to and from a bed to a chair (including a wheelchair)?: A Lot Help needed standing up from a chair using your arms (e.g., wheelchair or bedside chair)?: A Lot Help needed to walk in hospital room?: A Little Help needed climbing 3-5 steps with a railing? : A Lot 6 Click Score: 14    End of Session   Activity Tolerance: Patient tolerated treatment well Patient left: in chair;with call bell/phone within reach;with chair alarm set;with nursing/sitter in room Nurse Communication: Mobility status PT Visit Diagnosis: Unsteadiness on feet (R26.81);Pain;Muscle weakness (generalized) (M62.81)     Time: 6553-7482 PT Time Calculation (min) (ACUTE ONLY): 25 min  Charges:  $Gait Training: 8-22 mins $Therapeutic Activity: 8-22 mins                     Jetta Lout PTA 09/24/21, 5:51 PM

## 2021-09-24 NOTE — Progress Notes (Signed)
° °   Attending Progress Note  History: Diana Green is s/p T11-12 decompression and fusion   POD5: Doing well. Had BM yesterday  POD4: complaining of constipation this morning. Was on the bedside commode.  POD3: continues to describe soreness in her legs which improves with activity.  She also is concerned regarding some swelling in her left hand around her IV.  POD2: soreness in legs yesterday. No new or worsening symptoms   POD1: Pt reports new tremors in hands post-op. She remains conscious during these episodes and they same to worsen with goal orientated tasks. Continued decreased sensation in hands and feet which is baseline. Reports soreness in her back but this is largely controlled with current pain medications  Physical Exam: Vitals:   09/23/21 1955 09/24/21 0328  BP: (!) 109/49 (!) 117/54  Pulse: 75 70  Resp: 15 17  Temp: 98.8 F (37.1 C) 98.9 F (37.2 C)  SpO2: 100% 100%    AA Ox3 CNI Strength:5/5 throughout  Incision c/d/i  Data:  Recent Labs  Lab 09/19/21 1828 09/21/21 0814  NA 137 139  K 4.0 3.9  CL 104 106  CO2 25 28  BUN 23 30*  CREATININE 1.18* 1.27*  GLUCOSE 134* 115*  CALCIUM 9.7 9.3   No results for input(s): AST, ALT, ALKPHOS in the last 168 hours.  Invalid input(s): TBILI   Recent Labs  Lab 09/20/21 1231  WBC 15.1*  HGB 9.9*  HCT 30.7*  PLT 231   No results for input(s): APTT, INR in the last 168 hours.       Assessment/Plan:  Diana Green is a 71 y.o presenting with thoracic myelopathy s/p T11-12 decompression and fusion on 09/19/21  - mobilize - pain control - DVT prophylaxis - PTOT - dispo planning underway. Plans for discharge to SNF. Awaiting insurance approval  Venetia Night MD Department of Neurosurgery

## 2021-09-24 NOTE — TOC Progression Note (Addendum)
Transition of Care [facility]) - Progression Note    Patient Details  Name: Diana Green MRN: [id number] Date of Birth: 21-Sep-1950  [facility] CM/SW Contact  Joseph Art, LCSW Phone Number: 09/24/2021, 10:45 AM  Clinical Narrative:     CSW contacted Rosey Bath w/ [facility] [phone], requesting update on insurance auth for SNF placement, left message. SNF placement pending on insurance authorization.       Expected Discharge Plan and Services                                                 Social Determinants of Health (SDOH) Interventions    Readmission Risk Interventions No flowsheet data found.

## 2021-09-25 MED ORDER — DIPHENHYDRAMINE HCL 25 MG PO CAPS
25.0000 mg | ORAL_CAPSULE | Freq: Four times a day (QID) | ORAL | Status: DC | PRN
  Administered 2021-09-25 – 2021-09-26 (×2): 25 mg via ORAL
  Filled 2021-09-25 (×2): qty 1

## 2021-09-25 NOTE — Plan of Care (Signed)

## 2021-09-25 NOTE — Progress Notes (Signed)
° °   Attending Progress Note  History: KRISHAUNA SCHATZMAN is s/p T11-12 decompression and fusion   POD6: Doing well. No issues  POD5: Doing well. Had BM yesterday  POD4: complaining of constipation this morning. Was on the bedside commode.  POD3: continues to describe soreness in her legs which improves with activity.  She also is concerned regarding some swelling in her left hand around her IV.  POD2: soreness in legs yesterday. No new or worsening symptoms   POD1: Pt reports new tremors in hands post-op. She remains conscious during these episodes and they same to worsen with goal orientated tasks. Continued decreased sensation in hands and feet which is baseline. Reports soreness in her back but this is largely controlled with current pain medications  Physical Exam: Vitals:   09/25/21 0651 09/25/21 0806  BP: 107/60 108/68  Pulse: 66 64  Resp: 16 16  Temp: 97.7 F (36.5 C) 97.7 F (36.5 C)  SpO2: 99% 98%    AA Ox3 CNI Strength:5/5 throughout  Incision c/d/i  Data:  Recent Labs  Lab 09/19/21 1828 09/21/21 0814  NA 137 139  K 4.0 3.9  CL 104 106  CO2 25 28  BUN 23 30*  CREATININE 1.18* 1.27*  GLUCOSE 134* 115*  CALCIUM 9.7 9.3   No results for input(s): AST, ALT, ALKPHOS in the last 168 hours.  Invalid input(s): TBILI   Recent Labs  Lab 09/20/21 1231  WBC 15.1*  HGB 9.9*  HCT 30.7*  PLT 231   No results for input(s): APTT, INR in the last 168 hours.       Assessment/Plan:  MICHE LOUGHRIDGE is a 71 y.o presenting with thoracic myelopathy s/p T11-12 decompression and fusion on 09/19/21  - mobilize - pain control - DVT prophylaxis - PTOT - dispo planning underway. Plans for discharge to SNF. Awaiting insurance approval  Venetia Night MD Department of Neurosurgery

## 2021-09-25 NOTE — Progress Notes (Signed)
Physical Therapy Treatment Patient Details Name: Diana Green MRN: [id number] DOB: April 22, 1951 Today's Date: 09/25/2021   History of Present Illness 71yo presenting with thoracic myelopathy s/p T11-12 decompression and fusion on 09/19/21. Significant PMH includes: cervical myelopathy, C3-4 cervical diskectomy and fusion on 07/08/21, arthritis, depression, HLD, and HTN.    PT Comments    Pt ready for session.  Stated heart burn last night and not feeling great today due to poor sleep.  She is assisted to EOB with log roll technique and mod a x 1 to get fully to EOB.  Steady once sitting.  She needs increased time to fully stand and get legs extended but once up she is generally steady.  Able to walk to/from bathroom to void and attend to basic ADL tasks at sink.  Declined to walk further at this time but does remain up in chair with her feet down so she can "do my exercises."  Encouraged to walk to bathroom with staff vs pur-wic use.  Pt did ask multiple nutrition based questions and was encouraged to speak with nutritionist.  Secure chat to LPN to request consult.   Recommendations for follow up therapy are one component of a multi-disciplinary discharge planning process, led by the attending physician.  Recommendations may be updated based on patient status, additional functional criteria and insurance authorization.  Follow Up Recommendations  Skilled nursing-short term rehab (<3 hours/day)     Assistance Recommended at Discharge Frequent or constant Supervision/Assistance  Patient can return home with the following A lot of help with walking and/or transfers;Assistance with cooking/housework;Assist for transportation;Help with stairs or ramp for entrance;A little help with bathing/dressing/bathroom   Equipment Recommendations  BSC/3in1;Rolling walker (2 wheels)    Recommendations for Other Services       Precautions / Restrictions Precautions Precautions: Back;Fall Precaution Booklet  Issued: Yes (comment) Precaution Comments: spinal precautions Restrictions Weight Bearing Restrictions: No Other Position/Activity Restrictions: no brace     Mobility  Bed Mobility Overal bed mobility: Needs Assistance Bed Mobility: Sidelying to Sit, Rolling Rolling: Min assist Sidelying to sit: Mod assist            Transfers Overall transfer level: Needs assistance Equipment used: Rolling walker (2 wheels) Transfers: Sit to/from Stand Sit to Stand: Mod assist, Min assist                Ambulation/Gait Ambulation/Gait assistance: Supervision Gait Distance (Feet): 25 Feet Assistive device: Rolling walker (2 wheels)   Gait velocity: decreased     General Gait Details: 25' x 2 to and from bathroom to use commode and basic ADL tasks at sink   Marine scientist Rankin (Stroke Patients Only)       Balance Overall balance assessment: Needs assistance Sitting-balance support: Bilateral upper extremity supported, Feet supported, Single extremity supported Sitting balance-Leahy Scale: Good     Standing balance support: Bilateral upper extremity supported, During functional activity Standing balance-Leahy Scale: Fair                              Cognition Arousal/Alertness: Awake/alert Behavior During Therapy: WFL for tasks assessed/performed Overall Cognitive Status: Within Functional Limits for tasks assessed  General Comments: Pt is A and O x 4        Exercises      General Comments        Pertinent Vitals/Pain Pain Assessment Pain Assessment: Faces Faces Pain Scale: Hurts little more Pain Location: thoracic spine Pain Descriptors / Indicators: Grimacing, Guarding, Discomfort Pain Intervention(s): Limited activity within patient's tolerance, Monitored during session, Repositioned    Home Living                          Prior  Function            PT Goals (current goals can now be found in the care plan section) Progress towards PT goals: Progressing toward goals    Frequency    BID      PT Plan Current plan remains appropriate    Co-evaluation              AM-PAC PT "6 Clicks" Mobility   Outcome Measure  Help needed turning from your back to your side while in a flat bed without using bedrails?: A Little Help needed moving from lying on your back to sitting on the side of a flat bed without using bedrails?: A Lot Help needed moving to and from a bed to a chair (including a wheelchair)?: A Lot Help needed standing up from a chair using your arms (e.g., wheelchair or bedside chair)?: A Lot Help needed to walk in hospital room?: A Little Help needed climbing 3-5 steps with a railing? : A Lot 6 Click Score: 14    End of Session Equipment Utilized During Treatment: Gait belt Activity Tolerance: Patient tolerated treatment well Patient left: in chair;with call bell/phone within reach;with chair alarm set Nurse Communication: Mobility status PT Visit Diagnosis: Unsteadiness on feet (R26.81);Pain;Muscle weakness (generalized) (M62.81)     Time: [id number] PT Time Calculation (min) (ACUTE ONLY): 19 min  Charges:  $Gait Training: 8-22 mins                    Chesley Noon, PTA 09/25/21, 9:55 AM

## 2021-09-26 LAB — RESP PANEL BY RT-PCR (FLU A&B, COVID) ARPGX2
Influenza A by PCR: NEGATIVE
Influenza B by PCR: NEGATIVE
SARS Coronavirus 2 by RT PCR: NEGATIVE

## 2021-09-26 MED ORDER — METHOCARBAMOL 500 MG PO TABS: 500.0000 mg | ORAL_TABLET | Freq: Four times a day (QID) | ORAL | 0 refills | Status: DC | PRN

## 2021-09-26 MED ORDER — OXYCODONE HCL 5 MG PO TABS
5.0000 mg | ORAL_TABLET | ORAL | 0 refills | Status: AC | PRN
Start: 2021-09-26 — End: 2021-10-03

## 2021-09-26 MED ORDER — SENNA 8.6 MG PO TABS: 1.0000 | ORAL_TABLET | Freq: Two times a day (BID) | ORAL | 0 refills | Status: DC

## 2021-09-26 NOTE — Progress Notes (Signed)
Physical Therapy Treatment Patient Details Name: Diana Green MRN: [id number] DOB: 08-23-1950 Today's Date: 09/26/2021   History of Present Illness 71yo presenting with thoracic myelopathy s/p T11-12 decompression and fusion on 09/19/21. Significant PMH includes: cervical myelopathy, C3-4 cervical diskectomy and fusion on 07/08/21, arthritis, depression, HLD, and HTN.    PT Comments    Pt was awake and alert, resting in bed, upon PT entrance into room this morning. She denies being in any pain at rest. She was able to perform bed mobility w/ minA and reliance on bed rails to aid in log rolling technique and trunk elevated. Once seated EOB Pt was able to perform sit to stand w/ modA using bariatric RW; requiring increased verbal cues for proper UE placement and LE utilization. Pt was able to ambulate ~29ft to the bathroom in order to use BSC and was able to perform sit to stand w/ minA. She was then able to ambulate another ~31ft in hallway and return back to recliner. Pt will benefit from continued skilled PT in order to improve LE strength, mobility, gait, and restore PLOF. Current discharge recommendation remains appropriate due to the level of assistance required by the patient to ensure safety and improve overall function.   Recommendations for follow up therapy are one component of a multi-disciplinary discharge planning process, led by the attending physician.  Recommendations may be updated based on patient status, additional functional criteria and insurance authorization.  Follow Up Recommendations  Skilled nursing-short term rehab (<3 hours/day)     Assistance Recommended at Discharge    Patient can return home with the following Assistance with cooking/housework;Assist for transportation;Help with stairs or ramp for entrance;A little help with bathing/dressing/bathroom;A lot of help with walking and/or transfers   Equipment Recommendations  BSC/3in1;Rolling walker (2 wheels)     Recommendations for Other Services       Precautions / Restrictions Precautions Precautions: Back;Fall Precaution Booklet Issued: Yes (comment) Precaution Comments: spinal precautions Restrictions Weight Bearing Restrictions: No Other Position/Activity Restrictions: no brace     Mobility  Bed Mobility Overal bed mobility: Needs Assistance Bed Mobility: Sidelying to Sit, Rolling Rolling: Min assist Sidelying to sit: Min assist       General bed mobility comments: reliance on bedrails for rolling    Transfers Overall transfer level: Needs assistance Equipment used: Rolling walker (2 wheels) Transfers: Sit to/from Stand Sit to Stand: Mod assist           General transfer comment: still requires verbal cueing of utilizing UE through RW and extending LEs    Ambulation/Gait Ambulation/Gait assistance: Supervision Gait Distance (Feet): 85 Feet Assistive device: Rolling walker (2 wheels) Gait Pattern/deviations: Antalgic Gait velocity: decreased     General Gait Details: cueing to keep eyes up; and to stand all the way upright.   Stairs             Wheelchair Mobility    Modified Rankin (Stroke Patients Only)       Balance Overall balance assessment: Needs assistance Sitting-balance support: Bilateral upper extremity supported, Feet supported, Single extremity supported Sitting balance-Leahy Scale: Good     Standing balance support: Bilateral upper extremity supported, During functional activity Standing balance-Leahy Scale: Fair                              Cognition Arousal/Alertness: Awake/alert Behavior During Therapy: WFL for tasks assessed/performed Overall Cognitive Status: Within Functional Limits for tasks assessed  Exercises      General Comments        Pertinent Vitals/Pain Pain Assessment Pain Assessment: No/denies pain    Home Living                           Prior Function            PT Goals (current goals can now be found in the care plan section) Progress towards PT goals: Progressing toward goals    Frequency    BID      PT Plan Current plan remains appropriate    Co-evaluation              AM-PAC PT "6 Clicks" Mobility   Outcome Measure  Help needed turning from your back to your side while in a flat bed without using bedrails?: A Little Help needed moving from lying on your back to sitting on the side of a flat bed without using bedrails?: A Lot Help needed moving to and from a bed to a chair (including a wheelchair)?: A Lot Help needed standing up from a chair using your arms (e.g., wheelchair or bedside chair)?: A Lot Help needed to walk in hospital room?: A Little Help needed climbing 3-5 steps with a railing? : A Lot 6 Click Score: 14    End of Session Equipment Utilized During Treatment: Gait belt Activity Tolerance: Patient tolerated treatment well Patient left: in chair;with call bell/phone within reach;with chair alarm set Nurse Communication: Mobility status PT Visit Diagnosis: Unsteadiness on feet (R26.81);Pain;Muscle weakness (generalized) (M62.81)     Time: 6256-3893 PT Time Calculation (min) (ACUTE ONLY): 26 min  Charges:                         Jeralyn Bennett, SPT 09/26/2021, 11:29 AM

## 2021-09-26 NOTE — TOC Progression Note (Signed)
Transition of Care [facility]) - Progression Note    Patient Details  Name: Diana Green MRN: [id number] Date of Birth: [birth year]  [facility]) CM/SW Contact  Marlowe Sax, RN Phone Number: 09/26/2021, 10:45 AM  Clinical Narrative:   Reached out to [facility] in [place] to check ion status of auth, still pendinhg        Expected Discharge Plan and Services                                                 Social Determinants of Health (SDOH) Interventions    Readmission Risk Interventions No flowsheet data found.

## 2021-09-26 NOTE — Progress Notes (Cosign Needed)
Patient complained of itching requested medication. Medicated as ordered with Benedryl 25mg  PO at this time. Yemi, LPN notified. [place]mo[place], PNSN, [facility]

## 2021-09-26 NOTE — TOC Progression Note (Signed)
Transition of Care [facility]) - Progression Note    Patient Details  Name: Diana Green MRN: [id number] Date of Birth: 16-Jul-1951  Transition of Care [facility]) CM/SW Contact  Marlowe Sax, RN Phone Number: 09/26/2021, 3:25 PM  Clinical Narrative:   Called EMS to transport the patient to [facility] in [place], room 140 the patient is alert and oriented and will alert her Family of the DC and the room number, bedside nurse aware         Expected Discharge Plan and Services           Expected Discharge Date: 09/26/21                                     Social Determinants of Health (SDOH) Interventions    Readmission Risk Interventions No flowsheet data found.

## 2021-09-26 NOTE — Discharge Instructions (Signed)
NEUROSURGERY DISCHARGE INSTRUCTIONS  Admission diagnosis: Thoracic myelopathy [M47.14]  Operative procedure: T11-12 decompression and fusion  What to do after you leave the hospital:  Recommended diet: regular diet. Increase protein intake to promote wound healing.  Recommended activity: no lifting, driving, or strenuous exercise for 4 weeks . You should walk multiple times per day  Special Instructions  No straining, no heavy lifting > 10lbs x 4 weeks.  Keep incision area clean and dry. May shower in 2 days. No baths or pools for 6 weeks.  Please remove dressing tomorrow, no need to apply a bandage afterwards  You have no sutures to remove Please take pain medications as directed. Take a stool softener if on pain medications   Please Report any of the following: Nausea or Vomiting, Temperature is greater than 101.38F (38.1C) degrees, Dizziness, Abdominal Pain, Difficulty Breathing or Shortness of Breath, Inability to Eat, drink Fluids, or Take medications, Bleeding, swelling, or drainage from surgical incision sites, New numbness or weakness, and Bowel or bladder dysfunction to the neurosurgeon on call at [phone]  Additional Follow up appointments Please follow up with Cooper Render PA-C in [place] clinic as scheduled in 2-3 weeks   Please see below for scheduled appointments:  No future appointments.

## 2021-09-26 NOTE — Plan of Care (Signed)

## 2021-09-26 NOTE — Progress Notes (Signed)
° °   Attending Progress Note  History: Diana Green is s/p T11-12 decompression and fusion   POD7: NAEO>   POD6: Doing well. No issues  POD5: Doing well. Had BM yesterday  POD4: complaining of constipation this morning. Was on the bedside commode.  POD3: continues to describe soreness in her legs which improves with activity.  She also is concerned regarding some swelling in her left hand around her IV.  POD2: soreness in legs yesterday. No new or worsening symptoms   POD1: Pt reports new tremors in hands post-op. She remains conscious during these episodes and they same to worsen with goal orientated tasks. Continued decreased sensation in hands and feet which is baseline. Reports soreness in her back but this is largely controlled with current pain medications  Physical Exam: Vitals:   09/25/21 2013 09/26/21 0340  BP: 95/62 104/62  Pulse: 74 68  Resp: 16 16  Temp: 98.4 F (36.9 C) 98 F (36.7 C)  SpO2: 100% 97%    AA Ox3 CNI Strength:5/5 throughout  Incision c/d/i  Data:  Recent Labs  Lab 09/19/21 1828 09/21/21 0814  NA 137 139  K 4.0 3.9  CL 104 106  CO2 25 28  BUN 23 30*  CREATININE 1.18* 1.27*  GLUCOSE 134* 115*  CALCIUM 9.7 9.3    No results for input(s): AST, ALT, ALKPHOS in the last 168 hours.  Invalid input(s): TBILI   Recent Labs  Lab 09/20/21 1231  WBC 15.1*  HGB 9.9*  HCT 30.7*  PLT 231    No results for input(s): APTT, INR in the last 168 hours.       Assessment/Plan:  Diana Green is a 71 y.o presenting with thoracic myelopathy s/p T11-12 decompression and fusion on 09/19/21  - mobilize - pain control - DVT prophylaxis - PTOT - dispo planning underway. Plans for discharge to SNF. Awaiting insurance approval  Manning Charity PA-C Department of Neurosurgery

## 2021-09-26 NOTE — TOC Progression Note (Signed)
Transition of Care [facility]) - Progression Note    Patient Details  Name: Diana Green MRN: [id number] Date of Birth: 05-05-1951  Transition of Care [facility]) CM/SW Contact  Marlowe Sax, RN Phone Number: 09/26/2021, 12:45 PM  Clinical Narrative:    [facility] notified me that they have received Ins approval and the patient can DC today, I notified the physician        Expected Discharge Plan and Services                                                 Social Determinants of Health (SDOH) Interventions    Readmission Risk Interventions No flowsheet data found.

## 2021-09-26 NOTE — Discharge Summary (Signed)
Physician Discharge Summary  Patient ID: Diana Green MRN: [id number] DOB/AGE: 1950/09/19 71 y.o.  Admit date: 09/19/2021 Discharge date: 09/26/2021  Admission Diagnoses: Thoracic myelopathy  Discharge Diagnoses:  Principal Problem:   Thoracic myelopathy   Discharged Condition: good  Hospital Course:  Diana Green is a 71 year old female presenting with thoracic myelopathy status post T11-12 decompression and fusion on 09/19/21.  Her intraoperative course was uncomplicated and she was admitted for pain control and therapy evaluation.  She had a Hemovac drain and the output was monitored.  This was removed and the output came down to acceptable level.  She worked with physical therapy and was deemed appropriate for discharge to skilled nursing facility.  She was discharged on postop day #7 once a bed was available.  She was given medications for pain and muscle relaxant.  Consults: None  Significant Diagnostic Studies: None   Treatments: surgery: as above. See separately dictated operative report for further details.  Discharge Exam: Blood pressure 112/68, pulse 71, temperature 97.7 F (36.5 C), temperature source Oral, resp. rate 19, height 5\' 9"  (1.753 m), weight 124.7 kg, SpO2 99 %.  AA Ox3 CNI Strength:5/5 throughout  Incision c/d/i  Disposition: Discharge disposition: 03-Skilled Nursing Facility       Discharge Instructions     Incentive spirometry RT   Complete by: As directed    No dressing needed   Complete by: As directed       Allergies as of 09/26/2021   No Known Allergies      Medication List     STOP taking these medications    tiZANidine 4 MG tablet Commonly known as: ZANAFLEX   vitamin B-12 1000 MCG tablet Commonly known as: CYANOCOBALAMIN       TAKE these medications    acetaminophen 500 MG tablet Commonly known as: TYLENOL Take 500-1,000 mg by mouth every 6 (six) hours as needed (pain.).   atenolol 100 MG tablet Commonly known as:  TENORMIN Take 100 mg by mouth in the morning.   celecoxib 200 MG capsule Commonly known as: CELEBREX Take 200 mg by mouth 2 (two) times daily.   DULoxetine 60 MG capsule Commonly known as: CYMBALTA Take 60 mg by mouth in the morning.   gabapentin 600 MG tablet Commonly known as: NEURONTIN Take 600 mg by mouth 2 (two) times daily.   hydrochlorothiazide 25 MG tablet Commonly known as: HYDRODIURIL Take 25 mg by mouth in the morning.   Lidocaine 4 % Ptch Place 1 patch onto the skin daily as needed (back pain).   losartan 100 MG tablet Commonly known as: COZAAR Take 100 mg by mouth at bedtime.   methocarbamol 500 MG tablet Commonly known as: ROBAXIN Take 1 tablet (500 mg total) by mouth every 6 (six) hours as needed for muscle spasms.   oxyCODONE 5 MG immediate release tablet Commonly known as: Oxy IR/ROXICODONE Take 1 tablet (5 mg total) by mouth every 4 (four) hours as needed for up to 7 days (pain). What changed: reasons to take this   senna 8.6 MG Tabs tablet Commonly known as: SENOKOT Take 1 tablet (8.6 mg total) by mouth 2 (two) times daily.   simvastatin 20 MG tablet Commonly known as: ZOCOR Take 20 mg by mouth at bedtime.               Discharge Care Instructions  (From admission, onward)           Start     Ordered   09/26/21  0000  No dressing needed        09/26/21 1337            Contact information for after-discharge care     Destination     [facility] Preferred SNF .   Service: Skilled Nursing Contact information: [address] [place] [address] [phone]                     Signed: Susanne Borders 09/26/2021, 1:37 PM

## 2021-09-26 NOTE — Progress Notes (Signed)
Physical Therapy Treatment Patient Details Name: Diana Green MRN: [id number] DOB: 1950/12/30 Today's Date: 09/26/2021   History of Present Illness 70yo presenting with thoracic myelopathy s/p T11-12 decompression and fusion on 09/19/21. Significant PMH includes: cervical myelopathy, C3-4 cervical diskectomy and fusion on 07/08/21, arthritis, depression, HLD, and HTN.    PT Comments    Pt was awake and alert resting in recliner, upon PT entrance into room. She reports currently having 4/10 pain at rest. Pt was able to perform sit to stand from recliner w/ modA using bariatric RW. She was able to ambulate to the EOB to perform sit to stand w/ height of bed elevated. Pt was able to complete 5 reps of sit<>stand w/ height of bed being lowered after each successful attempt w/o needing assistance. The max height of 22in was determined as the lowest height of the bed before she relied on modA due to increased fatigue/general weakness. Pt will benefit from continued skilled PT in order to improve LE strength, mobility, gait, and restore PLOF. Current discharge recommendation remains appropriate due to the level of assistance required by the patient to ensure safety and improve overall function.   Recommendations for follow up therapy are one component of a multi-disciplinary discharge planning process, led by the attending physician.  Recommendations may be updated based on patient status, additional functional criteria and insurance authorization.  Follow Up Recommendations  Skilled nursing-short term rehab (<3 hours/day)     Assistance Recommended at Discharge Frequent or constant Supervision/Assistance  Patient can return home with the following Assistance with cooking/housework;Assist for transportation;Help with stairs or ramp for entrance;A little help with bathing/dressing/bathroom;A lot of help with walking and/or transfers   Equipment Recommendations  BSC/3in1;Rolling walker (2 wheels)  (bariatric)    Recommendations for Other Services       Precautions / Restrictions Precautions Precautions: Back;Fall Precaution Booklet Issued: Yes (comment) Precaution Comments: spinal precautions Restrictions Weight Bearing Restrictions: No Other Position/Activity Restrictions: no brace     Mobility  Bed Mobility Overal bed mobility: Needs Assistance Bed Mobility: Rolling, Sit to Sidelying Rolling: Min assist Sidelying to sit: Min assist     Sit to sidelying: Min assist (reliance on assistance for LEs) General bed mobility comments: reliance on bedrails for rolling    Transfers Overall transfer level: Needs assistance Equipment used: Rolling walker (2 wheels) Transfers: Sit to/from Stand Sit to Stand: Mod assist           General transfer comment: still requires verbal cueing of utilizing UE through RW and extending LEs    Ambulation/Gait Ambulation/Gait assistance: Supervision Gait Distance (Feet): 85 Feet Assistive device: Rolling walker (2 wheels) Gait Pattern/deviations: Antalgic Gait velocity: decreased     General Gait Details: cueing to keep eyes up; and to stand all the way upright.   Stairs             Wheelchair Mobility    Modified Rankin (Stroke Patients Only)       Balance Overall balance assessment: Needs assistance Sitting-balance support: Bilateral upper extremity supported, Feet supported, Single extremity supported Sitting balance-Leahy Scale: Good     Standing balance support: Bilateral upper extremity supported, During functional activity Standing balance-Leahy Scale: Fair                              Cognition Arousal/Alertness: Awake/alert Behavior During Therapy: WFL for tasks assessed/performed Overall Cognitive Status: Within Functional Limits for tasks assessed  Exercises Other Exercises Other Exercises: Pt was able to perform sit <>  stand x5 with height of bed elevated to 22inches before increase in fatigue.    General Comments        Pertinent Vitals/Pain Pain Assessment Pain Assessment: 0-10 Pain Score: 4  Pain Location: thoracic spine Pain Intervention(s): Limited activity within patient's tolerance, Monitored during session, Repositioned    Home Living                          Prior Function            PT Goals (current goals can now be found in the care plan section) Progress towards PT goals: Progressing toward goals    Frequency    BID      PT Plan Current plan remains appropriate    Co-evaluation              AM-PAC PT "6 Clicks" Mobility   Outcome Measure  Help needed turning from your back to your side while in a flat bed without using bedrails?: A Little Help needed moving from lying on your back to sitting on the side of a flat bed without using bedrails?: A Lot Help needed moving to and from a bed to a chair (including a wheelchair)?: A Lot Help needed standing up from a chair using your arms (e.g., wheelchair or bedside chair)?: A Little Help needed to walk in hospital room?: A Little Help needed climbing 3-5 steps with a railing? : A Lot 6 Click Score: 15    End of Session Equipment Utilized During Treatment: Gait belt Activity Tolerance: Patient tolerated treatment well Patient left: in bed;with nursing/sitter in room;with bed alarm set;with call bell/phone within reach Nurse Communication: Mobility status PT Visit Diagnosis: Unsteadiness on feet (R26.81);Pain;Muscle weakness (generalized) (M62.81) Pain - Right/Left:  (midline) Pain - part of body:  (thoracic spine)     Time: 6010-9323 PT Time Calculation (min) (ACUTE ONLY): 23 min  Charges:                         Jeralyn Bennett, SPT 09/26/2021, 3:33 PM

## 2021-09-26 NOTE — Plan of Care (Signed)
Patient discharged per MD orders.All discharge instructions,education and medications reviewed with the patient and son.Pt expressed understanding and will comply with dc instructions.follow up appointments was also communicated to the patient.no verbal c/o or any ssx of distress.Pt was discharged to the [facility] facility in [place] for STR/PT/OT services per order.Report was called in to floor nurse Ms.Autumn Messing before transport.Pt was transported by 2 EMS personnel on stretcher.

## 2022-03-08 ENCOUNTER — Other Ambulatory Visit: Payer: Self-pay

## 2022-03-08 DIAGNOSIS — Z981 Arthrodesis status: Secondary | ICD-10-CM

## 2022-03-09 ENCOUNTER — Ambulatory Visit: Payer: Medicare (Managed Care) | Admitting: Neurosurgery

## 2022-03-09 ENCOUNTER — Ambulatory Visit
Admission: RE | Admit: 2022-03-09 | Discharge: 2022-03-09 | Disposition: A | Discharge status: Home / Self Care | Payer: Medicare (Managed Care) | Attending: Internal Medicine | Admitting: Internal Medicine

## 2022-03-09 ENCOUNTER — Ambulatory Visit
Admission: RE | Admit: 2022-03-09 | Discharge: 2022-03-09 | Disposition: A | Discharge status: Home / Self Care | Payer: Medicare (Managed Care) | Source: Ambulatory Visit | Attending: Neurosurgery | Admitting: Neurosurgery

## 2022-03-09 VITALS — BP 120/78 | Ht 69.0 in | Wt 275.0 lb

## 2022-03-09 DIAGNOSIS — Z981 Arthrodesis status: Secondary | ICD-10-CM

## 2022-03-09 DIAGNOSIS — G959 Disease of spinal cord, unspecified: Secondary | ICD-10-CM

## 2022-03-09 DIAGNOSIS — M48062 Spinal stenosis, lumbar region with neurogenic claudication: Secondary | ICD-10-CM

## 2022-03-09 NOTE — Progress Notes (Signed)
   DOS: 07/08/21 (C3-4 ACDF) 09/19/21 (T11-12 PSFD)  HISTORY OF PRESENT ILLNESS: 03/09/2022 Ms. Chellsie Gomer is status post the above surgeries.  She is overall doing relatively well from that.  She continues to have pain down her legs.  She is done physical therapy for more than 6 weeks.  She continues to have impacts on a day-to-day life due to her leg discomfort.Marland Kitchen   PHYSICAL EXAMINATION:   Vitals:   03/09/22 1556  BP: 120/78   General: Patient is well developed, well nourished, calm, collected, and in no apparent distress.  NEUROLOGICAL:  General: In no acute distress.  Awake, alert, oriented to person, place, and time. Pupils equal round and reactive to light.   Strength: Side Biceps Triceps Deltoid Interossei Grip Wrist Ext. Wrist Flex.  R 5 5 5 5 5 5 5   L 5 5 5 5 5 5 5    Incision c/d/i   ROS (Neurologic): Negative except as noted above  IMAGING: No complications noted  ASSESSMENT/PLAN:  Sandy Haye is doing well after neck and thoracic spine surgery.  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  Her imaging is out of date.  I would like to obtain repeat a lumbar MRI scan as she has now done more than 6 weeks of conservative management during this calendar year.  I spent a total of 20 minutes in face-to-face and non-face-to-face activities related to this patient's care today.   MD, [facility] Department of Neurosurgery

## 2022-03-20 ENCOUNTER — Other Ambulatory Visit: Payer: Medicare (Managed Care)

## 2022-03-29 ENCOUNTER — Telehealth: Payer: Self-pay

## 2022-03-29 ENCOUNTER — Ambulatory Visit
Admission: RE | Admit: 2022-03-29 | Discharge: 2022-03-29 | Disposition: A | Discharge status: Home / Self Care | Payer: Medicare (Managed Care) | Source: Ambulatory Visit | Attending: Neurosurgery | Admitting: Neurosurgery

## 2022-03-29 DIAGNOSIS — M48062 Spinal stenosis, lumbar region with neurogenic claudication: Secondary | ICD-10-CM

## 2022-03-29 NOTE — Telephone Encounter (Signed)
Patient was seen on 03/09/22, and needed to follow up in 6 weeks. Please get her scheduled, you can put the following in the appt notes: "C3-4 ACDF on 07/08/21 and T11-12 PSFD on 09/19/21 and discuss MRI results//xrays today"

## 2022-03-29 NOTE — Telephone Encounter (Signed)
We have gotten a call report from [facility] in regards to her Lumbar MRI that was done 03/29/22.   IMPRESSION: 1. Thickened nerve roots, particularly visible at L4 and L5, which is nonspecific but can be seen with arachnoiditis. 2. L2-L3 and L4-L5 severe spinal canal stenosis and moderate bilateral neural foraminal narrowing. 3. L1-L2 severe spinal canal stenosis with moderate left and mild right neural foraminal narrowing. 4. L3-L4 moderate spinal canal stenosis with severe right and moderate left neural foraminal narrowing. 5. T12-L1 mild spinal canal stenosis with moderate to severe right and moderate left neural foraminal narrowing. 6. L5-S1 moderate to severe right and mild left neural foraminal narrowing. 7. Status post T11-T12 fusion, with apparent mild spinal canal stenosis at this level, although this is incompletely evaluated and may be secondary to only being visualized on the upper extent of the sagittal images.   These results will be called to the ordering clinician or representative by the Radiologist Assistant, and communication documented in the PACS or [employer].

## 2022-03-30 NOTE — Telephone Encounter (Signed)
Appt scheduled for 04/27/2022. Message left on her v/m with new appt date. Appt letter mailed to patient also.

## 2022-04-12 ENCOUNTER — Other Ambulatory Visit: Payer: Self-pay

## 2022-04-12 DIAGNOSIS — Z981 Arthrodesis status: Secondary | ICD-10-CM

## 2022-04-13 ENCOUNTER — Encounter: Payer: Self-pay | Admitting: Neurosurgery

## 2022-04-13 ENCOUNTER — Ambulatory Visit
Admission: RE | Admit: 2022-04-13 | Discharge: 2022-04-13 | Disposition: A | Discharge status: Home / Self Care | Payer: Medicare (Managed Care) | Attending: Internal Medicine | Admitting: Internal Medicine

## 2022-04-13 ENCOUNTER — Ambulatory Visit
Admission: RE | Admit: 2022-04-13 | Discharge: 2022-04-13 | Disposition: A | Discharge status: Home / Self Care | Payer: Medicare (Managed Care) | Source: Ambulatory Visit | Attending: Neurosurgery | Admitting: Neurosurgery

## 2022-04-13 ENCOUNTER — Ambulatory Visit (INDEPENDENT_AMBULATORY_CARE_PROVIDER_SITE_OTHER): Payer: Medicare (Managed Care) | Admitting: Neurosurgery

## 2022-04-13 VITALS — BP 136/84 | Ht 69.0 in | Wt 275.0 lb

## 2022-04-13 DIAGNOSIS — M48062 Spinal stenosis, lumbar region with neurogenic claudication: Secondary | ICD-10-CM

## 2022-04-13 DIAGNOSIS — Z981 Arthrodesis status: Secondary | ICD-10-CM | POA: Diagnosis present

## 2022-04-13 NOTE — Progress Notes (Signed)
   DOS: 07/08/21 (C3-4 ACDF) 09/19/21 (T11-12 PSFD)  HISTORY OF PRESENT ILLNESS: 04/13/2022 Diana Green continues to have the symptoms noted below.  She is here today to discuss her imaging.  03/09/22 Diana Green is status post the above surgeries.  She is overall doing relatively well from that.  She continues to have pain down her legs.  She is done physical therapy for more than 6 weeks.  She continues to have impacts on a day-to-day life due to her leg discomfort.Marland Kitchen   PHYSICAL EXAMINATION:   Vitals:   04/13/22 1105  BP: 136/84    General: Patient is well developed, well nourished, calm, collected, and in no apparent distress.  NEUROLOGICAL:  General: In no acute distress.  Awake, alert, oriented to person, place, and time. Pupils equal round and reactive to light.   Strength: Side Biceps Triceps Deltoid Interossei Grip Wrist Ext. Wrist Flex.  R 5 5 5 5 5 5 5   L 5 5 5 5 5 5 5    Incision c/d/i   ROS (Neurologic): Negative except as noted above  IMAGING: MRI L spine 03/09/22 IMPRESSION: 1. Thickened nerve roots, particularly visible at L4 and L5, which is nonspecific but can be seen with arachnoiditis. 2. L2-L3 and L4-L5 severe spinal canal stenosis and moderate bilateral neural foraminal narrowing. 3. L1-L2 severe spinal canal stenosis with moderate left and mild right neural foraminal narrowing. 4. L3-L4 moderate spinal canal stenosis with severe right and moderate left neural foraminal narrowing. 5. T12-L1 mild spinal canal stenosis with moderate to severe right and moderate left neural foraminal narrowing. 6. L5-S1 moderate to severe right and mild left neural foraminal narrowing. 7. Status post T11-T12 fusion, with apparent mild spinal canal stenosis at this level, although this is incompletely evaluated and may be secondary to only being visualized on the upper extent of the sagittal images.   These results will be called to the ordering clinician  or representative by the Radiologist Assistant, and communication documented in the PACS or .     Electronically Signed   By: 03/11/22 M.D.   On: 03/29/2022 11:40  ASSESSMENT/PLAN:  Diana Green is doing well after neck and thoracic spine surgery.  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  She has numbness in her legs.  She has significant lumbar disease at multiple levels including severe stenosis at L2 through L5 as well as right lateral recess compression at L5-S1 and moderate stenosis at L1-2.  She has chronic numbness in her legs due to her prior cervical and thoracic disease.  We discussed that I cannot guarantee she would have improvement in her symptoms with surgery.  We discussed that there are options including L3-S1 decompression versus L1-S1 decompression.  I reviewed with her that L1-S1 is a substantial length of surgery for her, and it may be easier for her to recover to do a smaller operation.    She will think about her options and let me know if she would like to move forward with surgery.    I spent a total of 20 minutes in face-to-face and non-face-to-face activities related to this patient's care today.   05/29/2022 MD, [facility] Department of Neurosurgery

## 2022-04-21 ENCOUNTER — Other Ambulatory Visit: Payer: Self-pay | Admitting: Internal Medicine

## 2022-04-21 DIAGNOSIS — N951 Menopausal and female climacteric states: Secondary | ICD-10-CM

## 2022-04-27 ENCOUNTER — Ambulatory Visit: Payer: Medicare (Managed Care) | Admitting: Neurosurgery

## 2022-05-30 ENCOUNTER — Other Ambulatory Visit: Payer: Self-pay | Admitting: Internal Medicine

## 2022-05-30 DIAGNOSIS — Z1231 Encounter for screening mammogram for malignant neoplasm of breast: Secondary | ICD-10-CM

## 2022-06-01 ENCOUNTER — Ambulatory Visit
Admission: RE | Admit: 2022-06-01 | Discharge: 2022-06-01 | Disposition: A | Discharge status: Home / Self Care | Payer: Medicare (Managed Care) | Source: Ambulatory Visit | Attending: Internal Medicine | Admitting: Internal Medicine

## 2022-06-01 DIAGNOSIS — Z780 Asymptomatic menopausal state: Secondary | ICD-10-CM | POA: Insufficient documentation

## 2022-06-01 DIAGNOSIS — Z13820 Encounter for screening for osteoporosis: Secondary | ICD-10-CM | POA: Insufficient documentation

## 2022-06-01 DIAGNOSIS — N951 Menopausal and female climacteric states: Secondary | ICD-10-CM | POA: Insufficient documentation

## 2022-06-30 ENCOUNTER — Ambulatory Visit
Admission: RE | Admit: 2022-06-30 | Discharge: 2022-06-30 | Disposition: A | Discharge status: Home / Self Care | Payer: Medicare (Managed Care) | Source: Ambulatory Visit | Attending: Internal Medicine | Admitting: Internal Medicine

## 2022-06-30 DIAGNOSIS — Z1231 Encounter for screening mammogram for malignant neoplasm of breast: Secondary | ICD-10-CM | POA: Diagnosis present

## 2022-08-23 ENCOUNTER — Other Ambulatory Visit: Payer: Self-pay | Admitting: Orthopedic Surgery

## 2022-08-30 ENCOUNTER — Encounter
Admission: RE | Admit: 2022-08-30 | Discharge: 2022-08-30 | Disposition: A | Discharge status: Home / Self Care | Payer: Medicare Other | Source: Ambulatory Visit | Attending: Orthopedic Surgery | Admitting: Orthopedic Surgery

## 2022-08-30 VITALS — Ht 69.0 in | Wt 265.0 lb

## 2022-08-30 DIAGNOSIS — Z01812 Encounter for preprocedural laboratory examination: Secondary | ICD-10-CM

## 2022-08-30 HISTORY — DX: Gastro-esophageal reflux disease without esophagitis: K21.9

## 2022-08-30 NOTE — Patient Instructions (Addendum)
Your procedure is scheduled on:09-11-22 Monday Report to the Registration Desk on the 1st floor of the [facility].Then proceed to the 2nd floor Surgery Desk To find out your arrival time, please call [phone] between 1PM - 3PM on:09-08-22 Friday If your arrival time is 6:00 am, do not arrive prior to that time as the [facility] entrance doors do not open until 6:00 am.  REMEMBER: Instructions that are not followed completely may result in serious medical risk, up to and including death; or upon the discretion of your surgeon and anesthesiologist your surgery may need to be rescheduled.  Do not eat food after midnight the night before surgery.  No gum chewing, lozengers or hard candies.  You may however, drink CLEAR liquids up to 2 hours before you are scheduled to arrive for your surgery. Do not drink anything within 2 hours of your scheduled arrival time.  Clear liquids include: - water  - apple juice without pulp - gatorade (not RED colors) - black coffee or tea (Do NOT add milk or creamers to the coffee or tea) Do NOT drink anything that is not on this list.  In addition, your doctor has ordered for you to drink the provided  Ensure Pre-Surgery Clear Carbohydrate Drink Drinking this carbohydrate drink up to two hours before surgery helps to reduce insulin resistance and improve patient outcomes. Please complete drinking 2 hours prior to scheduled arrival time.  TAKE THESE MEDICATIONS THE MORNING OF SURGERY WITH A SIP OF WATER: -atenolol (TENORMIN)  -DULoxetine (CYMBALTA)  -gabapentin (NEURONTIN)   One week prior to surgery: Stop Anti-inflammatories (NSAIDS) such as Advil, Aleve, Ibuprofen, Motrin, Naproxen, Naprosyn and Aspirin based products such as Excedrin, Goodys Powder, BC Powder.You may however, continue to take Tylenol if needed for pain up until the day of surgery.   Stop ANY OVER THE COUNTER supplements/vitamins 7 days prior to surgery  No Alcohol for 24 hours  before or after surgery.  No Smoking including e-cigarettes for 24 hours prior to surgery.  No chewable tobacco products for at least 6 hours prior to surgery.  No nicotine patches on the day of surgery.  Do not use any "recreational" drugs for at least a week prior to your surgery.  Please be advised that the combination of cocaine and anesthesia may have negative outcomes, up to and including death. If you test positive for cocaine, your surgery will be cancelled.  On the morning of surgery brush your teeth with toothpaste and water, you may rinse your mouth with mouthwash if you wish. Do not swallow any toothpaste or mouthwash.  Use CHG Soap as directed on instruction sheet.  Do not wear jewelry, make-up, hairpins, clips or nail polish.  Do not wear lotions, powders, or perfumes.   Do not shave body from the neck down 48 hours prior to surgery just in case you cut yourself which could leave a site for infection.  Also, freshly shaved skin may become irritated if using the CHG soap.  Contact lenses, hearing aids and dentures may not be worn into surgery.  Do not bring valuables to the hospital. [facility] is not responsible for any missing/lost belongings or valuables.    Notify your doctor if there is any change in your medical condition (cold, fever, infection).  Wear comfortable clothing (specific to your surgery type) to the hospital.  After surgery, you can help prevent lung complications by doing breathing exercises.  Take deep breaths and cough every 1-2 hours. Your doctor  may order a device called an Incentive Spirometer to help you take deep breaths. When coughing or sneezing, hold a pillow firmly against your incision with both hands. This is called "splinting." Doing this helps protect your incision. It also decreases belly discomfort.  If you are being admitted to the hospital overnight, leave your suitcase in the car. After surgery it may be brought to your  room.  If you are being discharged the day of surgery, you will not be allowed to drive home. You will need a responsible adult (18 years or older) to drive you home and stay with you that night.   If you are taking public transportation, you will need to have a responsible adult (18 years or older) with you. Please confirm with your physician that it is acceptable to use public transportation.   Please call the Pre-admissions Testing Dept. at [phone] if you have any questions about these instructions.  Surgery Visitation Policy:  Patients undergoing a surgery or procedure may have two family members or support persons with them as long as the person is not COVID-19 positive or experiencing its symptoms.   Inpatient Visitation:    Visiting hours are 7 a.m. to 8 p.m. Up to four visitors are allowed at one time in a patient room. The visitors may rotate out with other people during the day. One designated support person (adult) may remain overnight.  Due to an increase in RSV and influenza rates and associated hospitalizations, children ages 19 and under will not be able to visit patients in [facility]. Masks continue to be strongly recommended.   How to Use an Incentive Spirometer An incentive spirometer is a tool that measures how well you are filling your lungs with each breath. Learning to take long, deep breaths using this tool can help you keep your lungs clear and active. This may help to reverse or lessen your chance of developing breathing (pulmonary) problems, especially infection. You may be asked to use a spirometer: After a surgery. If you have a lung problem or a history of smoking. After a long period of time when you have been unable to move or be active. If the spirometer includes an indicator to show the highest number that you have reached, your health care provider or respiratory therapist will help you set a goal. Keep a log of your progress as told by  your health care provider. What are the risks? Breathing too quickly may cause dizziness or cause you to pass out. Take your time so you do not get dizzy or light-headed. If you are in pain, you may need to take pain medicine before doing incentive spirometry. It is harder to take a deep breath if you are having pain. How to use your incentive spirometer  Sit up on the edge of your bed or on a chair. Hold the incentive spirometer so that it is in an upright position. Before you use the spirometer, breathe out normally. Place the mouthpiece in your mouth. Make sure your lips are closed tightly around it. Breathe in slowly and as deeply as you can through your mouth, causing the piston or the ball to rise toward the top of the chamber. Hold your breath for 3-5 seconds, or for as long as possible. If the spirometer includes a coach indicator, use this to guide you in breathing. Slow down your breathing if the indicator goes above the marked areas. Remove the mouthpiece from your mouth and breathe out  normally. The piston or ball will return to the bottom of the chamber. Rest for a few seconds, then repeat the steps 10 or more times. Take your time and take a few normal breaths between deep breaths so that you do not get dizzy or light-headed. Do this every 1-2 hours when you are awake. If the spirometer includes a goal marker to show the highest number you have reached (best effort), use this as a goal to work toward during each repetition. After each set of 10 deep breaths, cough a few times. This will help to make sure that your lungs are clear. If you have an incision on your chest or abdomen from surgery, place a pillow or a rolled-up towel firmly against the incision when you cough. This can help to reduce pain while taking deep breaths and coughing. General tips When you are able to get out of bed: Walk around often. Continue to take deep breaths and cough in order to clear your  lungs. Keep using the incentive spirometer until your health care provider says it is okay to stop using it. If you have been in the hospital, you may be told to keep using the spirometer at home. Contact a health care provider if: You are having difficulty using the spirometer. You have trouble using the spirometer as often as instructed. Your pain medicine is not giving enough relief for you to use the spirometer as told. You have a fever. Get help right away if: You develop shortness of breath. You develop a cough with bloody mucus from the lungs. You have fluid or blood coming from an incision site after you cough. Summary An incentive spirometer is a tool that can help you learn to take long, deep breaths to keep your lungs clear and active. You may be asked to use a spirometer after a surgery, if you have a lung problem or a history of smoking, or if you have been inactive for a long period of time. Use your incentive spirometer as instructed every 1-2 hours while you are awake. If you have an incision on your chest or abdomen, place a pillow or a rolled-up towel firmly against your incision when you cough. This will help to reduce pain. Get help right away if you have shortness of breath, you cough up bloody mucus, or blood comes from your incision when you cough. This information is not intended to replace advice given to you by your health care provider. Make sure you discuss any questions you have with your health care provider. Document Revised: 10/27/2019 Document Reviewed: 10/27/2019 Elsevier Patient Education  [place].

## 2022-09-05 ENCOUNTER — Encounter
Admission: RE | Admit: 2022-09-05 | Discharge: 2022-09-05 | Disposition: A | Discharge status: Home / Self Care | Payer: Medicare Other | Source: Ambulatory Visit | Attending: Orthopedic Surgery | Admitting: Orthopedic Surgery

## 2022-09-05 DIAGNOSIS — Z01812 Encounter for preprocedural laboratory examination: Secondary | ICD-10-CM

## 2022-09-05 DIAGNOSIS — Z01818 Encounter for other preprocedural examination: Secondary | ICD-10-CM | POA: Insufficient documentation

## 2022-09-05 DIAGNOSIS — M1711 Unilateral primary osteoarthritis, right knee: Secondary | ICD-10-CM | POA: Diagnosis not present

## 2022-09-05 LAB — CBC WITH DIFFERENTIAL/PLATELET
Abs Immature Granulocytes: 0.02 10*3/uL (ref 0.00–0.07)
Basophils Absolute: 0 10*3/uL (ref 0.0–0.1)
Basophils Relative: 1 %
Eosinophils Absolute: 0.2 10*3/uL (ref 0.0–0.5)
Eosinophils Relative: 4 %
HCT: 33 % — ABNORMAL LOW (ref 36.0–46.0)
Hemoglobin: 10.7 g/dL — ABNORMAL LOW (ref 12.0–15.0)
Immature Granulocytes: 0 %
Lymphocytes Relative: 24 %
Lymphs Abs: 1.4 10*3/uL (ref 0.7–4.0)
MCH: 28.9 pg (ref 26.0–34.0)
MCHC: 32.4 g/dL (ref 30.0–36.0)
MCV: 89.2 fL (ref 80.0–100.0)
Monocytes Absolute: 0.4 10*3/uL (ref 0.1–1.0)
Monocytes Relative: 7 %
Neutro Abs: 3.7 10*3/uL (ref 1.7–7.7)
Neutrophils Relative %: 64 %
Platelets: 239 10*3/uL (ref 150–400)
RBC: 3.7 MIL/uL — ABNORMAL LOW (ref 3.87–5.11)
RDW: 12.7 % (ref 11.5–15.5)
WBC: 5.8 10*3/uL (ref 4.0–10.5)
nRBC: 0 % (ref 0.0–0.2)

## 2022-09-05 LAB — COMPREHENSIVE METABOLIC PANEL
ALT: 14 U/L (ref 0–44)
AST: 19 U/L (ref 15–41)
Albumin: 4 g/dL (ref 3.5–5.0)
Alkaline Phosphatase: 60 U/L (ref 38–126)
Anion gap: 7 (ref 5–15)
BUN: 25 mg/dL — ABNORMAL HIGH (ref 8–23)
CO2: 27 mmol/L (ref 22–32)
Calcium: 10.2 mg/dL (ref 8.9–10.3)
Chloride: 105 mmol/L (ref 98–111)
Creatinine, Ser: 1.08 mg/dL — ABNORMAL HIGH (ref 0.44–1.00)
GFR, Estimated: 55 mL/min — ABNORMAL LOW (ref >60)
Glucose, Bld: 95 mg/dL (ref 70–99)
Potassium: 4.2 mmol/L (ref 3.5–5.1)
Sodium: 139 mmol/L (ref 135–145)
Total Bilirubin: 0.6 mg/dL (ref 0.3–1.2)
Total Protein: 7.4 g/dL (ref 6.5–8.1)

## 2022-09-05 LAB — URINALYSIS, ROUTINE W REFLEX MICROSCOPIC
Bilirubin Urine: NEGATIVE
Glucose, UA: NEGATIVE mg/dL
Hgb urine dipstick: NEGATIVE
Ketones, ur: NEGATIVE mg/dL
Leukocytes,Ua: NEGATIVE
Nitrite: NEGATIVE
Protein, ur: NEGATIVE mg/dL
Specific Gravity, Urine: 1.016 (ref 1.005–1.030)
pH: 6 (ref 5.0–8.0)

## 2022-09-05 LAB — TYPE AND SCREEN
ABO/RH(D): A POS
Antibody Screen: NEGATIVE

## 2022-09-05 LAB — SURGICAL PCR SCREEN
MRSA, PCR: NEGATIVE
Staphylococcus aureus: NEGATIVE

## 2022-09-10 MED ORDER — TRANEXAMIC ACID-NACL 1000-0.7 MG/100ML-% IV SOLN
1000.0000 mg | INTRAVENOUS | Status: AC
  Administered 2022-09-11 (×2): 1000 mg via INTRAVENOUS

## 2022-09-10 MED ORDER — ORAL CARE MOUTH RINSE: 15.0000 mL | Freq: Once | OROMUCOSAL | Status: AC

## 2022-09-10 MED ORDER — FAMOTIDINE 20 MG PO TABS: 20.0000 mg | ORAL_TABLET | Freq: Once | ORAL | Status: AC

## 2022-09-10 MED ORDER — CHLORHEXIDINE GLUCONATE 0.12 % MT SOLN: 15.0000 mL | Freq: Once | OROMUCOSAL | Status: AC

## 2022-09-10 MED ORDER — CEFAZOLIN IN SODIUM CHLORIDE 3-0.9 GM/100ML-% IV SOLN
3.0000 g | INTRAVENOUS | Status: AC
  Administered 2022-09-11: 3 g via INTRAVENOUS
  Filled 2022-09-10: qty 100

## 2022-09-10 MED ORDER — DEXAMETHASONE SODIUM PHOSPHATE 10 MG/ML IJ SOLN
8.0000 mg | Freq: Once | INTRAMUSCULAR | Status: AC
  Administered 2022-09-11: 10 mg via INTRAVENOUS

## 2022-09-10 MED ORDER — LACTATED RINGERS IV SOLN: INTRAVENOUS | Status: DC

## 2022-09-11 ENCOUNTER — Ambulatory Visit: Payer: Medicare Other | Admitting: Urgent Care

## 2022-09-11 ENCOUNTER — Ambulatory Visit: Payer: Medicare Other

## 2022-09-11 ENCOUNTER — Encounter
Admission: RE | Disposition: A | Discharge status: Home / Self Care | Payer: Self-pay | Source: Home / Self Care | Attending: Orthopedic Surgery

## 2022-09-11 ENCOUNTER — Other Ambulatory Visit: Payer: Self-pay

## 2022-09-11 ENCOUNTER — Observation Stay
Admission: RE | Admit: 2022-09-11 | Discharge: 2022-09-12 | Disposition: A | Discharge status: Home / Self Care | Payer: Medicare Other | Attending: Orthopedic Surgery | Admitting: Orthopedic Surgery

## 2022-09-11 ENCOUNTER — Encounter: Payer: Self-pay | Admitting: Orthopedic Surgery

## 2022-09-11 DIAGNOSIS — Z79899 Other long term (current) drug therapy: Secondary | ICD-10-CM | POA: Diagnosis not present

## 2022-09-11 DIAGNOSIS — Z96651 Presence of right artificial knee joint: Secondary | ICD-10-CM | POA: Diagnosis not present

## 2022-09-11 DIAGNOSIS — Z96652 Presence of left artificial knee joint: Secondary | ICD-10-CM | POA: Insufficient documentation

## 2022-09-11 DIAGNOSIS — Z471 Aftercare following joint replacement surgery: Secondary | ICD-10-CM | POA: Diagnosis not present

## 2022-09-11 DIAGNOSIS — M1711 Unilateral primary osteoarthritis, right knee: Secondary | ICD-10-CM | POA: Diagnosis not present

## 2022-09-11 DIAGNOSIS — M25561 Pain in right knee: Secondary | ICD-10-CM | POA: Diagnosis present

## 2022-09-11 DIAGNOSIS — I10 Essential (primary) hypertension: Secondary | ICD-10-CM | POA: Insufficient documentation

## 2022-09-11 HISTORY — PX: TOTAL KNEE ARTHROPLASTY: SHX125

## 2022-09-11 SURGERY — ARTHROPLASTY, KNEE, TOTAL
Anesthesia: General | Site: Knee | Laterality: Right

## 2022-09-11 MED ORDER — SIMVASTATIN 20 MG PO TABS
20.0000 mg | ORAL_TABLET | Freq: Every day | ORAL | Status: DC
  Administered 2022-09-11: 20 mg via ORAL
  Filled 2022-09-11: qty 1

## 2022-09-11 MED ORDER — HYDROCODONE-ACETAMINOPHEN 5-325 MG PO TABS: 1.0000 | ORAL_TABLET | ORAL | Status: DC | PRN

## 2022-09-11 MED ORDER — OXYCODONE HCL 5 MG PO TABS
5.0000 mg | ORAL_TABLET | Freq: Once | ORAL | Status: AC | PRN
  Administered 2022-09-11: 5 mg via ORAL

## 2022-09-11 MED ORDER — KETOROLAC TROMETHAMINE 15 MG/ML IJ SOLN
INTRAMUSCULAR | Status: AC
  Filled 2022-09-11: qty 1

## 2022-09-11 MED ORDER — LOSARTAN POTASSIUM 50 MG PO TABS
100.0000 mg | ORAL_TABLET | Freq: Every day | ORAL | Status: DC
  Administered 2022-09-12: 100 mg via ORAL

## 2022-09-11 MED ORDER — SODIUM CHLORIDE 0.9 % IR SOLN
Status: DC | PRN
  Administered 2022-09-11: 3000 mL

## 2022-09-11 MED ORDER — OXYCODONE HCL 5 MG/5ML PO SOLN: 5.0000 mg | Freq: Once | ORAL | Status: AC | PRN

## 2022-09-11 MED ORDER — OXYCODONE HCL 5 MG PO TABS
ORAL_TABLET | ORAL | Status: AC
  Filled 2022-09-11: qty 1

## 2022-09-11 MED ORDER — KETOROLAC TROMETHAMINE 15 MG/ML IJ SOLN
7.5000 mg | Freq: Four times a day (QID) | INTRAMUSCULAR | Status: AC
  Administered 2022-09-11 – 2022-09-12 (×4): 7.5 mg via INTRAVENOUS

## 2022-09-11 MED ORDER — LIDOCAINE HCL (PF) 2 % IJ SOLN
INTRAMUSCULAR | Status: AC
  Filled 2022-09-11: qty 5

## 2022-09-11 MED ORDER — ACETAMINOPHEN 500 MG PO TABS
ORAL_TABLET | ORAL | Status: AC
  Administered 2022-09-11: 1000 mg via ORAL
  Filled 2022-09-11: qty 2

## 2022-09-11 MED ORDER — PROPOFOL 10 MG/ML IV BOLUS
INTRAVENOUS | Status: DC | PRN
  Administered 2022-09-11: 10 mg via INTRAVENOUS
  Administered 2022-09-11: 140 mg via INTRAVENOUS

## 2022-09-11 MED ORDER — MORPHINE SULFATE (PF) 4 MG/ML IV SOLN: 0.5000 mg | INTRAVENOUS | Status: DC | PRN

## 2022-09-11 MED ORDER — METOCLOPRAMIDE HCL 5 MG PO TABS: 5.0000 mg | ORAL_TABLET | Freq: Three times a day (TID) | ORAL | Status: DC | PRN

## 2022-09-11 MED ORDER — CEFAZOLIN SODIUM-DEXTROSE 2-4 GM/100ML-% IV SOLN
INTRAVENOUS | Status: AC
  Administered 2022-09-11: 2000 mg
  Filled 2022-09-11: qty 100

## 2022-09-11 MED ORDER — CEFAZOLIN SODIUM-DEXTROSE 2-4 GM/100ML-% IV SOLN
INTRAVENOUS | Status: AC
  Filled 2022-09-11: qty 100

## 2022-09-11 MED ORDER — ACETAMINOPHEN 10 MG/ML IV SOLN
INTRAVENOUS | Status: AC
  Filled 2022-09-11: qty 100

## 2022-09-11 MED ORDER — PHENYLEPHRINE 80 MCG/ML (10ML) SYRINGE FOR IV PUSH (FOR BLOOD PRESSURE SUPPORT)
PREFILLED_SYRINGE | INTRAVENOUS | Status: AC
  Filled 2022-09-11: qty 10

## 2022-09-11 MED ORDER — PROMETHAZINE HCL 25 MG/ML IJ SOLN: 6.2500 mg | INTRAMUSCULAR | Status: DC | PRN

## 2022-09-11 MED ORDER — HYDROCHLOROTHIAZIDE 25 MG PO TABS
25.0000 mg | ORAL_TABLET | Freq: Every morning | ORAL | Status: DC
  Administered 2022-09-12: 25 mg via ORAL

## 2022-09-11 MED ORDER — ROCURONIUM BROMIDE 100 MG/10ML IV SOLN
INTRAVENOUS | Status: DC | PRN
  Administered 2022-09-11: 10 mg via INTRAVENOUS
  Administered 2022-09-11: 50 mg via INTRAVENOUS
  Administered 2022-09-11: 10 mg via INTRAVENOUS

## 2022-09-11 MED ORDER — TRAMADOL HCL 50 MG PO TABS: 50.0000 mg | ORAL_TABLET | Freq: Four times a day (QID) | ORAL | Status: DC | PRN

## 2022-09-11 MED ORDER — MENTHOL 3 MG MT LOZG: 1.0000 | LOZENGE | OROMUCOSAL | Status: DC | PRN

## 2022-09-11 MED ORDER — PANTOPRAZOLE SODIUM 40 MG PO TBEC
40.0000 mg | DELAYED_RELEASE_TABLET | Freq: Every day | ORAL | Status: DC
  Administered 2022-09-12: 40 mg via ORAL

## 2022-09-11 MED ORDER — BUPIVACAINE HCL (PF) 0.25 % IJ SOLN
INTRAMUSCULAR | Status: AC
  Filled 2022-09-11: qty 30

## 2022-09-11 MED ORDER — EPHEDRINE 5 MG/ML INJ
INTRAVENOUS | Status: AC
  Filled 2022-09-11: qty 5

## 2022-09-11 MED ORDER — FENTANYL CITRATE (PF) 100 MCG/2ML IJ SOLN
25.0000 ug | INTRAMUSCULAR | Status: DC | PRN
  Administered 2022-09-11 (×3): 25 ug via INTRAVENOUS

## 2022-09-11 MED ORDER — PHENYLEPHRINE HCL-NACL 20-0.9 MG/250ML-% IV SOLN
INTRAVENOUS | Status: AC
  Filled 2022-09-11: qty 250

## 2022-09-11 MED ORDER — GABAPENTIN 300 MG PO CAPS
600.0000 mg | ORAL_CAPSULE | Freq: Two times a day (BID) | ORAL | Status: DC
  Administered 2022-09-11 – 2022-09-12 (×2): 600 mg via ORAL

## 2022-09-11 MED ORDER — BUPIVACAINE HCL (PF) 0.5 % IJ SOLN
INTRAMUSCULAR | Status: AC
  Filled 2022-09-11: qty 10

## 2022-09-11 MED ORDER — ENOXAPARIN SODIUM 30 MG/0.3ML IJ SOSY
30.0000 mg | PREFILLED_SYRINGE | Freq: Two times a day (BID) | INTRAMUSCULAR | Status: DC
  Administered 2022-09-12: 30 mg via SUBCUTANEOUS

## 2022-09-11 MED ORDER — BUPIVACAINE LIPOSOME 1.3 % IJ SUSP
INTRAMUSCULAR | Status: AC
  Filled 2022-09-11: qty 20

## 2022-09-11 MED ORDER — DEXAMETHASONE SODIUM PHOSPHATE 10 MG/ML IJ SOLN
INTRAMUSCULAR | Status: AC
  Filled 2022-09-11: qty 1

## 2022-09-11 MED ORDER — PANTOPRAZOLE SODIUM 40 MG PO TBEC
DELAYED_RELEASE_TABLET | ORAL | Status: AC
  Administered 2022-09-11: 40 mg via ORAL
  Filled 2022-09-11: qty 1

## 2022-09-11 MED ORDER — SENNOSIDES-DOCUSATE SODIUM 8.6-50 MG PO TABS
ORAL_TABLET | ORAL | Status: AC
  Filled 2022-09-11: qty 1

## 2022-09-11 MED ORDER — SODIUM CHLORIDE 0.9 % IV SOLN: INTRAVENOUS | Status: DC

## 2022-09-11 MED ORDER — SODIUM CHLORIDE (PF) 0.9 % IJ SOLN
INTRAMUSCULAR | Status: DC | PRN
  Administered 2022-09-11: 70 mL via INTRAMUSCULAR

## 2022-09-11 MED ORDER — SUGAMMADEX SODIUM 200 MG/2ML IV SOLN
INTRAVENOUS | Status: DC | PRN
  Administered 2022-09-11: 200 mg via INTRAVENOUS
  Administered 2022-09-11: 50 mg via INTRAVENOUS

## 2022-09-11 MED ORDER — ONDANSETRON HCL 4 MG PO TABS: 4.0000 mg | ORAL_TABLET | Freq: Four times a day (QID) | ORAL | Status: DC | PRN

## 2022-09-11 MED ORDER — ROCURONIUM BROMIDE 10 MG/ML (PF) SYRINGE
PREFILLED_SYRINGE | INTRAVENOUS | Status: AC
  Filled 2022-09-11: qty 10

## 2022-09-11 MED ORDER — CEFAZOLIN IN SODIUM CHLORIDE 3-0.9 GM/100ML-% IV SOLN
3.0000 g | Freq: Three times a day (TID) | INTRAVENOUS | Status: AC
  Administered 2022-09-11: 3 g via INTRAVENOUS
  Filled 2022-09-11 (×2): qty 100

## 2022-09-11 MED ORDER — FENTANYL CITRATE (PF) 100 MCG/2ML IJ SOLN
INTRAMUSCULAR | Status: AC
  Administered 2022-09-11: 25 ug via INTRAVENOUS
  Filled 2022-09-11: qty 2

## 2022-09-11 MED ORDER — PHENOL 1.4 % MT LIQD: 1.0000 | OROMUCOSAL | Status: DC | PRN

## 2022-09-11 MED ORDER — ORAL CARE MOUTH RINSE: 15.0000 mL | OROMUCOSAL | Status: DC | PRN

## 2022-09-11 MED ORDER — TRANEXAMIC ACID-NACL 1000-0.7 MG/100ML-% IV SOLN
INTRAVENOUS | Status: AC
  Filled 2022-09-11: qty 100

## 2022-09-11 MED ORDER — TRANEXAMIC ACID 1000 MG/10ML IV SOLN
INTRAVENOUS | Status: AC
  Filled 2022-09-11: qty 10

## 2022-09-11 MED ORDER — EPINEPHRINE PF 1 MG/ML IJ SOLN
INTRAMUSCULAR | Status: AC
  Filled 2022-09-11: qty 1

## 2022-09-11 MED ORDER — ACETAMINOPHEN 10 MG/ML IV SOLN: 1000.0000 mg | Freq: Once | INTRAVENOUS | Status: DC | PRN

## 2022-09-11 MED ORDER — ACETAMINOPHEN 500 MG PO TABS
ORAL_TABLET | ORAL | Status: AC
  Filled 2022-09-11: qty 2

## 2022-09-11 MED ORDER — IRRISEPT - 450ML BOTTLE WITH 0.05% CHG IN STERILE WATER, USP 99.95% OPTIME
TOPICAL | Status: DC | PRN
  Administered 2022-09-11: 450 mL

## 2022-09-11 MED ORDER — ONDANSETRON HCL 4 MG/2ML IJ SOLN
INTRAMUSCULAR | Status: DC | PRN
  Administered 2022-09-11: 4 mg via INTRAVENOUS

## 2022-09-11 MED ORDER — FENTANYL CITRATE (PF) 100 MCG/2ML IJ SOLN
INTRAMUSCULAR | Status: AC
  Filled 2022-09-11: qty 2

## 2022-09-11 MED ORDER — LOSARTAN POTASSIUM 50 MG PO TABS
ORAL_TABLET | ORAL | Status: AC
  Administered 2022-09-11: 100 mg via ORAL
  Filled 2022-09-11: qty 2

## 2022-09-11 MED ORDER — GABAPENTIN 300 MG PO CAPS
ORAL_CAPSULE | ORAL | Status: AC
  Filled 2022-09-11: qty 2

## 2022-09-11 MED ORDER — DOCUSATE SODIUM 100 MG PO CAPS
ORAL_CAPSULE | ORAL | Status: AC
  Filled 2022-09-11: qty 1

## 2022-09-11 MED ORDER — ATENOLOL 50 MG PO TABS
100.0000 mg | ORAL_TABLET | Freq: Every morning | ORAL | Status: DC
  Administered 2022-09-12: 100 mg via ORAL

## 2022-09-11 MED ORDER — PROPOFOL 1000 MG/100ML IV EMUL
INTRAVENOUS | Status: AC
  Filled 2022-09-11: qty 100

## 2022-09-11 MED ORDER — SODIUM CHLORIDE FLUSH 0.9 % IV SOLN
INTRAVENOUS | Status: AC
  Filled 2022-09-11: qty 10

## 2022-09-11 MED ORDER — DROPERIDOL 2.5 MG/ML IJ SOLN: 0.6250 mg | Freq: Once | INTRAMUSCULAR | Status: DC | PRN

## 2022-09-11 MED ORDER — DULOXETINE HCL 60 MG PO CPEP
60.0000 mg | ORAL_CAPSULE | Freq: Every morning | ORAL | Status: DC
  Filled 2022-09-11: qty 1

## 2022-09-11 MED ORDER — LIDOCAINE HCL (CARDIAC) PF 100 MG/5ML IV SOSY
PREFILLED_SYRINGE | INTRAVENOUS | Status: DC | PRN
  Administered 2022-09-11: 100 mg via INTRAVENOUS

## 2022-09-11 MED ORDER — ONDANSETRON HCL 4 MG/2ML IJ SOLN: 4.0000 mg | Freq: Four times a day (QID) | INTRAMUSCULAR | Status: DC | PRN

## 2022-09-11 MED ORDER — SENNA 8.6 MG PO TABS
1.0000 | ORAL_TABLET | Freq: Every day | ORAL | Status: DC
  Administered 2022-09-11: 8.6 mg via ORAL

## 2022-09-11 MED ORDER — FENTANYL CITRATE (PF) 100 MCG/2ML IJ SOLN
INTRAMUSCULAR | Status: DC | PRN
  Administered 2022-09-11 (×2): 50 ug via INTRAVENOUS

## 2022-09-11 MED ORDER — CHLORHEXIDINE GLUCONATE 0.12 % MT SOLN
OROMUCOSAL | Status: AC
  Administered 2022-09-11: 15 mL via OROMUCOSAL
  Filled 2022-09-11: qty 15

## 2022-09-11 MED ORDER — ONDANSETRON HCL 4 MG/2ML IJ SOLN
INTRAMUSCULAR | Status: AC
  Filled 2022-09-11: qty 2

## 2022-09-11 MED ORDER — PHENYLEPHRINE HCL-NACL 20-0.9 MG/250ML-% IV SOLN
INTRAVENOUS | Status: DC | PRN
  Administered 2022-09-11: 20 ug/min via INTRAVENOUS

## 2022-09-11 MED ORDER — DOCUSATE SODIUM 100 MG PO CAPS
100.0000 mg | ORAL_CAPSULE | Freq: Two times a day (BID) | ORAL | Status: DC
  Administered 2022-09-11 – 2022-09-12 (×2): 100 mg via ORAL

## 2022-09-11 MED ORDER — SODIUM CHLORIDE FLUSH 0.9 % IV SOLN
INTRAVENOUS | Status: AC
  Filled 2022-09-11: qty 20

## 2022-09-11 MED ORDER — FAMOTIDINE 20 MG PO TABS
ORAL_TABLET | ORAL | Status: AC
  Administered 2022-09-11: 20 mg via ORAL
  Filled 2022-09-11: qty 1

## 2022-09-11 MED ORDER — DOCUSATE SODIUM 100 MG PO CAPS
ORAL_CAPSULE | ORAL | Status: AC
  Administered 2022-09-11: 100 mg via ORAL
  Filled 2022-09-11: qty 1

## 2022-09-11 MED ORDER — METOCLOPRAMIDE HCL 5 MG/ML IJ SOLN: 5.0000 mg | Freq: Three times a day (TID) | INTRAMUSCULAR | Status: DC | PRN

## 2022-09-11 MED ORDER — ACETAMINOPHEN 500 MG PO TABS
1000.0000 mg | ORAL_TABLET | Freq: Three times a day (TID) | ORAL | Status: AC
  Administered 2022-09-11 – 2022-09-12 (×2): 1000 mg via ORAL

## 2022-09-11 MED ORDER — ACETAMINOPHEN 10 MG/ML IV SOLN
INTRAVENOUS | Status: DC | PRN
  Administered 2022-09-11: 1000 mg via INTRAVENOUS

## 2022-09-11 MED ORDER — TRAMADOL HCL 50 MG PO TABS
ORAL_TABLET | ORAL | Status: AC
  Administered 2022-09-11: 50 mg via ORAL
  Filled 2022-09-11: qty 1

## 2022-09-11 MED ORDER — EPHEDRINE SULFATE (PRESSORS) 50 MG/ML IJ SOLN
INTRAMUSCULAR | Status: DC | PRN
  Administered 2022-09-11: 10 mg via INTRAVENOUS
  Administered 2022-09-11: 5 mg via INTRAVENOUS

## 2022-09-11 SURGICAL SUPPLY — 82 items
BLADE PATELLA REAM PILOT HOLE (MISCELLANEOUS) IMPLANT
BLADE SAW SAG 25X90X1.19 (BLADE) ×1 IMPLANT
BLADE SAW SAG 29X58X.64 (BLADE) ×1 IMPLANT
BNDG ELASTIC 6X5.8 VLCR STR LF (GAUZE/BANDAGES/DRESSINGS) ×1 IMPLANT
BOWL CEMENT MIX W/ADAPTER (MISCELLANEOUS) ×1 IMPLANT
CEMENT BONE R 1X40 (Cement) ×2 IMPLANT
CHLORAPREP W/TINT 26 (MISCELLANEOUS) ×2 IMPLANT
COOLER POLAR GLACIER W/PUMP (MISCELLANEOUS) ×1 IMPLANT
CUFF TOURN SGL QUICK 24 (TOURNIQUET CUFF)
CUFF TOURN SGL QUICK 34 (TOURNIQUET CUFF)
CUFF TRNQT CYL 24X4X16.5-23 (TOURNIQUET CUFF) IMPLANT
CUFF TRNQT CYL 34X4.125X (TOURNIQUET CUFF) IMPLANT
DERMABOND ADVANCED .7 DNX12 (GAUZE/BANDAGES/DRESSINGS) ×1 IMPLANT
DRAPE 3/4 80X56 (DRAPES) ×1 IMPLANT
DRAPE INCISE IOBAN 66X60 STRL (DRAPES) ×1 IMPLANT
DRSG MEPILEX SACRM 8.7X9.8 (GAUZE/BANDAGES/DRESSINGS) ×1 IMPLANT
DRSG OPSITE POSTOP 4X10 (GAUZE/BANDAGES/DRESSINGS) IMPLANT
DRSG OPSITE POSTOP 4X8 (GAUZE/BANDAGES/DRESSINGS) IMPLANT
ELECT CAUTERY BLADE 6.4 (BLADE) ×1 IMPLANT
ELECT REM PT RETURN 9FT ADLT (ELECTROSURGICAL) ×1
ELECTRODE REM PT RTRN 9FT ADLT (ELECTROSURGICAL) ×1 IMPLANT
FEMUR CMT CR STD SZ 8 RT KNEE (Joint) ×1 IMPLANT
FEMUR CMTD CR STD SZ 8 RT KNEE (Joint) IMPLANT
GLOVE BIO SURGEON STRL SZ8 (GLOVE) ×1 IMPLANT
GLOVE BIOGEL PI IND STRL 8 (GLOVE) ×1 IMPLANT
GLOVE PI ORTHO PRO STRL 7.5 (GLOVE) ×2 IMPLANT
GLOVE PI ORTHO PRO STRL SZ8 (GLOVE) ×2 IMPLANT
GOWN STRL REUS W/ TWL LRG LVL3 (GOWN DISPOSABLE) ×1 IMPLANT
GOWN STRL REUS W/ TWL XL LVL3 (GOWN DISPOSABLE) ×2 IMPLANT
GOWN STRL REUS W/TWL LRG LVL3 (GOWN DISPOSABLE) ×1
GOWN STRL REUS W/TWL XL LVL3 (GOWN DISPOSABLE) ×2
HANDLE YANKAUER SUCT OPEN TIP (MISCELLANEOUS) ×1 IMPLANT
HDLS TROCR DRIL PIN KNEE 75 (PIN) ×1
HOLDER FOLEY CATH W/STRAP (MISCELLANEOUS) ×1 IMPLANT
HOOD PEEL AWAY T7 (MISCELLANEOUS) ×3 IMPLANT
INSERT TIB ASF PS 8-11 GH RT (Insert) IMPLANT
IV NS IRRIG 3000ML ARTHROMATIC (IV SOLUTION) ×1 IMPLANT
JET LAVAGE IRRISEPT WOUND (IRRIGATION / IRRIGATOR) ×1
KIT TURNOVER KIT A (KITS) ×1 IMPLANT
LAVAGE JET IRRISEPT WOUND (IRRIGATION / IRRIGATOR) IMPLANT
MANIFOLD NEPTUNE II (INSTRUMENTS) ×1 IMPLANT
MARKER SKIN DUAL TIP RULER LAB (MISCELLANEOUS) ×1 IMPLANT
MAT ABSORB  FLUID 56X50 GRAY (MISCELLANEOUS) ×1
MAT ABSORB FLUID 56X50 GRAY (MISCELLANEOUS) ×1 IMPLANT
NDL HYPO 21X1.5 SAFETY (NEEDLE) ×1 IMPLANT
NDL SAFETY ECLIP 18X1.5 (MISCELLANEOUS) ×1 IMPLANT
NEEDLE HYPO 21X1.5 SAFETY (NEEDLE) ×1 IMPLANT
NS IRRIG 500ML POUR BTL (IV SOLUTION) ×1 IMPLANT
PACK TOTAL KNEE (MISCELLANEOUS) ×1 IMPLANT
PAD WRAPON POLAR KNEE (MISCELLANEOUS) ×1 IMPLANT
PAD WRAPON POLOR MULTI XL (MISCELLANEOUS) IMPLANT
PENCIL SMOKE EVACUATOR (MISCELLANEOUS) ×1 IMPLANT
PIN DRILL HDLS TROCAR 75 4PK (PIN) IMPLANT
PULSAVAC PLUS IRRIG FAN TIP (DISPOSABLE) ×1
SCREW FEMALE HEX FIX 25X2.5 (ORTHOPEDIC DISPOSABLE SUPPLIES) IMPLANT
SCREW HEX HEADED 3.5X27 DISP (ORTHOPEDIC DISPOSABLE SUPPLIES) IMPLANT
SLEEVE SCD COMPRESS KNEE MED (STOCKING) ×1 IMPLANT
SOLUTION IRRIG SURGIPHOR (IV SOLUTION) ×1 IMPLANT
STEM POLY PAT PLY 35M KNEE (Knees) IMPLANT
STEM TIB ST PERS 14+30 (Stem) IMPLANT
STEM TIBIA 5 DEG SZ G R KNEE (Knees) IMPLANT
SUT DVC 2 QUILL PDO  T11 36X36 (SUTURE) ×1
SUT DVC 2 QUILL PDO T11 36X36 (SUTURE) ×1 IMPLANT
SUT QUILL MONODERM 3-0 PS-2 (SUTURE) ×1 IMPLANT
SUT VIC AB 0 CT1 36 (SUTURE) ×1 IMPLANT
SUT VIC AB 2-0 CT2 27 (SUTURE) ×2 IMPLANT
SUT VICRYL 1-0 27IN ABS (SUTURE) ×1
SUTURE VICRYL 1-0 27IN ABS (SUTURE) ×1 IMPLANT
SYR 10ML LL (SYRINGE) ×1 IMPLANT
SYR 30ML LL (SYRINGE) ×2 IMPLANT
SYR 3ML LL SCALE MARK (SYRINGE) ×1 IMPLANT
TAPE CLOTH 3X10 WHT NS LF (GAUZE/BANDAGES/DRESSINGS) ×1 IMPLANT
TIBIA STEM 5 DEG SZ G R KNEE (Knees) ×1 IMPLANT
TIP FAN IRRIG PULSAVAC PLUS (DISPOSABLE) ×1 IMPLANT
TOWEL OR 17X26 4PK STRL BLUE (TOWEL DISPOSABLE) IMPLANT
TRAP FLUID SMOKE EVACUATOR (MISCELLANEOUS) ×1 IMPLANT
TRAY FOLEY SLVR 16FR LF STAT (SET/KITS/TRAYS/PACK) ×1 IMPLANT
TUBE KAMVAC SUCTION (TUBING) IMPLANT
WATER STERILE IRR 1000ML POUR (IV SOLUTION) ×1 IMPLANT
WRAP-ON POLOR PAD MULTI XL (MISCELLANEOUS) ×1
WRAPON POLAR PAD KNEE (MISCELLANEOUS)
WRAPON POLOR PAD MULTI XL (MISCELLANEOUS) ×1

## 2022-09-11 NOTE — Transfer of Care (Signed)
Immediate Anesthesia Transfer of Care Note  Patient: Diana Green  Procedure(s) Performed: TOTAL KNEE ARTHROPLASTY (Right: Knee)  Patient Location: PACU  Anesthesia Type:General  Level of Consciousness: drowsy  Airway & Oxygen Therapy: Patient Spontanous Breathing and Patient connected to face mask oxygen  Post-op Assessment: Report given to RN and Post -op Vital signs reviewed and stable  Post vital signs: Reviewed and stable  Last Vitals:  Vitals Value Taken Time  BP 166/74 1012  Temp 35.9 1012  Pulse 68 1012  Resp 18 1012  SpO2 98 1012    Last Pain:  Vitals:   09/11/22 0637  TempSrc: Oral         Complications: No notable events documented.

## 2022-09-11 NOTE — Evaluation (Signed)
Physical Therapy Evaluation Patient Details Name: Diana Green MRN: [id number] DOB: 30-May-1951 Today's Date: 09/11/2022  History of Present Illness  Pt admitted for R TKR; POD 0 at time of evaluation. History of depression, HLD, and HTN.  Clinical Impression  Pt is a pleasant 72 year old female who was admitted for R TKR. Pt performs bed mobility with min assist, transfers with mod assist +2, and ambulation with min assist and use of RW. Pt demonstrates ability to perform 10 SLRs with independence, therefore does not require KI for mobility.  Pt demonstrates deficits with strength/ROM/mobility/pain. Pt is currently not safe to dc home and needs extensive physical assist. Would benefit from skilled PT to address above deficits and promote optimal return to PLOF; recommend transition to STR upon discharge from acute hospitalization.      Recommendations for follow up therapy are one component of a multi-disciplinary discharge planning process, led by the attending physician.  Recommendations may be updated based on patient status, additional functional criteria and insurance authorization.  Follow Up Recommendations Skilled nursing-short term rehab (<3 hours/day) Can patient physically be transported by private vehicle: No    Assistance Recommended at Discharge Intermittent Supervision/Assistance  Patient can return home with the following  Two people to help with walking and/or transfers;Two people to help with bathing/dressing/bathroom;Help with stairs or ramp for entrance    Equipment Recommendations  (TBD)  Recommendations for Other Services       Functional Status Assessment Patient has had a recent decline in their functional status and demonstrates the ability to make significant improvements in function in a reasonable and predictable amount of time.     Precautions / Restrictions Precautions Precautions: Fall Restrictions Weight Bearing Restrictions: No      Mobility  Bed  Mobility Overal bed mobility: Needs Assistance Bed Mobility: Supine to Sit     Supine to sit: Min assist     General bed mobility comments: needs cues for sequencing.    Transfers Overall transfer level: Needs assistance Equipment used: Rolling walker (2 wheels) Transfers: Sit to/from Stand Sit to Stand: Mod assist, +2 physical assistance, From elevated surface           General transfer comment: needs cues for sequencing including hand placement. Very slow transfer. Several attempts, needed +2 for safety/assist. Once standing, upright posture noted    Ambulation/Gait Ambulation/Gait assistance: Min assist Gait Distance (Feet): 5 Feet Assistive device: Rolling walker (2 wheels) Gait Pattern/deviations: Step-to pattern       General Gait Details: ambulated with slow and effortful steps. Heavy WBing through B UE.  Stairs            Wheelchair Mobility    Modified Rankin (Stroke Patients Only)       Balance Overall balance assessment: Needs assistance Sitting-balance support: Feet supported Sitting balance-Leahy Scale: Fair     Standing balance support: Bilateral upper extremity supported Standing balance-Leahy Scale: Poor                               Pertinent Vitals/Pain Pain Assessment Pain Assessment: 0-10 Pain Score: 5  Pain Location: R knee Pain Descriptors / Indicators: Operative site guarding Pain Intervention(s): Limited activity within patient's tolerance    Home Living Family/patient expects to be discharged to:: Private residence Living Arrangements: Spouse/significant other Available Help at Discharge: Family (patient is caregiver for husband) Type of Home: House Home Access: Stairs to enter Entrance Stairs-Rails: Can  reach both Entrance Stairs-Number of Steps: 5   Home Layout: One level Home Equipment: Conservation officer, nature (2 wheels)      Prior Function Prior Level of Function : Independent/Modified Independent              Mobility Comments: was using RW prior to admission       Hand Dominance        Extremity/Trunk Assessment   Upper Extremity Assessment Upper Extremity Assessment: Generalized weakness    Lower Extremity Assessment Lower Extremity Assessment: Generalized weakness (R LE grossly 3/5; L LE grossly 3+/5)       Communication   Communication: No difficulties  Cognition Arousal/Alertness: Awake/alert Behavior During Therapy: WFL for tasks assessed/performed Overall Cognitive Status: Within Functional Limits for tasks assessed                                          General Comments      Exercises Total Joint Exercises Goniometric ROM: R knee 0-90 degrees Other Exercises Other Exercises: pt with urge incontience upon standing. Pt with urine inconitence with transfer to [facility]. +2 for assistance   Assessment/Plan    PT Assessment Patient needs continued PT services  PT Problem List Decreased strength;Decreased range of motion;Decreased balance;Decreased mobility;Pain       PT Treatment Interventions DME instruction;Gait training;Stair training;Therapeutic exercise;Balance training    PT Goals (Current goals can be found in the Care Plan section)  Acute Rehab PT Goals Patient Stated Goal: to go to rehab PT Goal Formulation: With patient Time For Goal Achievement: 09/25/22 Potential to Achieve Goals: Fair    Frequency BID     Co-evaluation               AM-PAC PT "6 Clicks" Mobility  Outcome Measure Help needed turning from your back to your side while in a flat bed without using bedrails?: A Lot Help needed moving from lying on your back to sitting on the side of a flat bed without using bedrails?: A Lot Help needed moving to and from a bed to a chair (including a wheelchair)?: A Lot Help needed standing up from a chair using your arms (e.g., wheelchair or bedside chair)?: A Lot Help needed to walk in hospital room?: A Lot Help  needed climbing 3-5 steps with a railing? : Total 6 Click Score: 11    End of Session Equipment Utilized During Treatment: Gait belt Activity Tolerance: Patient limited by pain Patient left: in chair Nurse Communication: Mobility status PT Visit Diagnosis: Muscle weakness (generalized) (M62.81);Difficulty in walking, not elsewhere classified (R26.2);Pain    Time: 1531-1601 PT Time Calculation (min) (ACUTE ONLY): 30 min   Charges:   PT Evaluation $PT Eval Low Complexity: 1 Low PT Treatments $Therapeutic Activity: 8-22 mins        Greggory Stallion, PT, DPT, GCS [phone]   Kasie Leccese 09/11/2022, 5:00 PM

## 2022-09-11 NOTE — Anesthesia Procedure Notes (Signed)
Procedure Name: Intubation Date/Time: 09/11/2022 7:44 AM  Performed by: Cammie Sickle, CRNAPre-anesthesia Checklist: Patient identified, Patient being monitored, Timeout performed, Emergency Drugs available and Suction available Patient Re-evaluated:Patient Re-evaluated prior to induction Oxygen Delivery Method: Circle system utilized Preoxygenation: Pre-oxygenation with 100% oxygen Induction Type: IV induction Ventilation: Mask ventilation without difficulty and Oral airway inserted - appropriate to patient size Laryngoscope Size: McGraph and 4 Grade View: Grade I Tube type: Oral Tube size: 7.5 mm Number of attempts: 1 Airway Equipment and Method: Stylet Placement Confirmation: ETT inserted through vocal cords under direct vision, positive ETCO2 and breath sounds checked- equal and bilateral Secured at: 22 cm Tube secured with: Tape Dental Injury: Teeth and Oropharynx as per pre-operative assessment

## 2022-09-11 NOTE — Interval H&P Note (Signed)
Patient agrees with plan to proceed with right total knee arthroplasty.

## 2022-09-11 NOTE — TOC Progression Note (Signed)
Transition of Care [facility]) - Progression Note    Patient Details  Name: Diana Green MRN: [id number] Date of Birth: 1951/06/20  Transition of Care [facility]) CM/SW [place], RN Phone Number: 09/11/2022, 4:16 PM  Clinical Narrative:     [facility] was set up for [facility] prior to Surgery by Surgeons office       Expected Discharge Plan and Services                                               Social Determinants of Health (SDOH) Interventions SDOH Screenings   Food Insecurity: No Food Insecurity (09/11/2022)  Housing: Low Risk  (09/11/2022)  Transportation Needs: No Transportation Needs (09/11/2022)  Utilities: Not At Risk (09/11/2022)  Tobacco Use: Low Risk  (09/11/2022)    Readmission Risk Interventions     No data to display

## 2022-09-11 NOTE — Op Note (Signed)
Patient Name: Diana Green  FIE:[id number]  Pre-Operative Diagnosis: Right knee Osteoarthritis  Post-Operative Diagnosis: (same)  Procedure: Right Total Knee Arthroplasty  Components/Implants: Femur: Persona Size 8 CR   Tibia: Persona Size G w/ 14x67mm stem extension  Poly: 57mm MC  Patella: 35x68mm symmetrci  Femoral Valgus Cut Angle: 5 degrees  Distal Femoral Re-cut: None  Patella Resurfacing: Yes   Date of Surgery: 09/11/2022  Surgeon: Steffanie Rainwater MD  Assistant: Dorise Hiss PA (present and scrubbed throughout the case, critical for assistance with exposure, retraction, instrumentation, and closure)   Anesthesiologist: Wynetta Emery  Anesthesia: General   Tourniquet Time: 72 min  EBL: 50cc  IVF: 166AY  Complications: None   Brief history: The patient is a 72 year old female with a history of osteoarthritis of the right knee with pain limiting their range of motion and activities of daily living, which has failed multiple attempts at conservative therapy.  The risks and benefits of total knee arthroplasty as definitive surgical treatment were discussed with the patient, who opted to proceed with the operation.  After outpatient medical clearance and optimization was completed the patient was admitted to [facility] for the procedure.  All preoperative films were reviewed and an appropriate surgical plan was made prior to surgery. Preoperative range of motion was 5 to 90 with a 5 degree flexion contracture.   Description of procedure: The patient was brought to the operating room where laterality was confirmed by all those present to be the right side.   Spinal anesthesia was administered and the patient received an intravenous dose of antibiotics for surgical prophylaxis and a dose of tranexamic acid.  Patient is positioned supine on the operating room table with all bony prominences well-padded.  A well-padded tourniquet was applied to the right thigh.   The knee was then prepped and draped in usual sterile fashion with multiple layers of adhesive and nonadhesive drapes.  All of those present in the operating room participated in a surgical timeout laterality and patient were confirmed.   An Esmarch was wrapped around the extremity and the leg was elevated and the knee flexed.  The tourniquet was inflated to a pressure of 275 mmHg. The Esmarch was removed and the leg was brought down to full extension.  The patella and tibial tubercle identified and outlined using a marking pen and a midline skin incision was made with a knife carried through the subcutaneous tissue down to the extensor retinaculum.  After exposure of the extensor mechanism the medial parapatellar arthrotomy was performed with a scalpel and electrocautery extending down medial and distal to the tibial tubercle taking care to avoid incising the patellar tendon.   A standard medial release was performed over the proximal tibia.  The knee was brought into extension in order to excise the fat pad taking care not to damage the patella tendon.  The superior soft tissue was removed from the anterior surface of the distal femur to visualize for the procedure.  The knee was then brought into flexion with the patella subluxed laterally and subluxing the tibia anteriorly.  The remaining ACL tissue was transected and removed with electrocautery and additional soft tissue was removed from the proximal surface of the tibia to fully expose. The PCL was found to be intact and was preserved.  An extramedullary tibial cutting guide was then applied to the leg with a spring-loaded ankle clamp placed around the distal tibia just above the malleoli the angulation of the guide was adjusted to give  some posterior slope in the tibial resection with an appropriate varus/valgus alignment.  The resection guide was then pinned to the proximal tibia and the proximal tibial surface was resected with an oscillating saw.   Careful attention was paid to ensure the blade did not disrupt any of the soft tissues including any lateral or medial ligament.  Attention was then turned to the femur, with the knee slightly flexed a opening drill was used to enter the medullary canal of the femur.  After removing the drill marrow was suctioned out to decompress the distal femur.  An intramedullary femoral guide was then inserted into the drill hole and the alignment guide was seated firmly against the distal end of the medial femoral condyle.  The distal femoral cutting guide was then attached and pinned securely to the anterior surface of the femur and the intramedullary rod and alignment guide was removed.  Distal femur resection was then performed with an oscillating saw with retractors protecting medial and laterally.   The distal cutting block was then removed and the extension gap was checked with a spacer.  Extension gap was found to be appropriately sized to accommodate the spacer block.   The femoral sizing guide was then placed securely into the posterior condyles of the femur and the femoral size was measured and determined to be 8.  The size 8; 4-in-1 cutting guide was placed in position and secured with 2 pins.  The anterior posterior and chamfer resections were then performed with an oscillating saw.  Bony fragments and osteophytes were then removed.  Using a lamina spreader the posterior medial and lateral condyles were checked for additional osteophytes and posterior soft tissue remnants.  Any remaining meniscus was removed at this time.  Periarticular injection was performed in the meniscal rims and posterior capsule with aspiration performed to ensure no intravascular injection.   The tibia was then exposed and the tibial trial was pinned onto the plateau after confirming appropriate orientation and rotation.  Using the drill bushing the tibia was prepared to the appropriate drill depth.  Tibial broach impactor was then  driven through the punch guide using a mallet.  The femoral trial component was then inserted onto the femur.  A trial tibial polyethylene bearing was then placed and the knee was reduced.  The knee achieved full extension with no hyperextension and was found to be balanced in flexion and extension with the trials in place.  The knee was then brought into full extension the patella was everted and held with 2 Kocher clamps.  The articular surface of the patella was then resected with an patella reamer and saw after careful measurement with a caliper.  The patella was then prepared with the drill guide and a trial patella was placed.  The knee was then taken through range of motion and it was found that the patella circulated appropriately with the trochlea and good patellofemoral motion without subluxation.    The correct final components for implantation were confirmed and opened by the circulator nurse.  The prepared surfaces of the patella femur and tibia were cleaned with pulsatile lavage to remove all blood fat and other material and then the surfaces were dried.  2 bags of cement were mixed under vacuum and the components were cemented into place.  Excess cement was removed with curettes and forceps.  The final polyethylene tibial component was implanted and the knee was brought into full extension to allow the cement to set.  At this  time the periarticular injection cocktail was placed in the soft tissues surrounding the knee.  The knee was then irrigated with copious amount of normal saline via pulsatile lavage to remove all loose bodies and other debris.  The knee was then irrigated with irrisept wash and reirrigated with saline.  The tourniquet was then dropped and all bleeding vessels were identified and coagulated.  The arthrotomy was approximated with #1 Vicryl and closed with #2 Quill suture.  The knee was brought into slight flexion and the subcutaneous tissues were closed with 0 Vicryl, 2-0 Vicryl  and a running subcuticular 3-0 monoderm quil suture.  Skin was then glued with Dermabond.  A sterile adhesive dressing was then placed along with a sequential compression device to the calf, a Ted stocking, and a cryotherapy cuff.   Sponge, needle, and Lap counts were all correct at the end of the case.   The patient was transferred off of the operating room table to a hospital bed, good pulses were found distally on the operative side.  The patient was transferred to the recovery room in stable condition.

## 2022-09-11 NOTE — H&P (Signed)
History of Present Illness: The patient is an 72 y.o. female seen in clinic today for history and physical for right total knee arthroplasty with Dr. Karel Jarvis on 09/11/2022. Patient has severe right knee pain that is interfering with her quality of life and activities of daily living. X-rays show severe degenerative arthritis with complete loss of joint space along the medial compartment. Patient's pain is interferes significantly with her ability to walk. She has a hard time standing and walking despite using a cane or a walker. Patient's pain is sharp worse with weightbearing. She has swelling and instability. She has tried physical therapy, gabapentin, Celebrex, Tylenol as well as injections with no relief. Patient currently using a walker. Pain is 10 out of 10.  Patient is not a diabetic, she does not smoke, and her BMI is 39.1 no history of blood clots.  Past Medical History: Past Medical History:  Diagnosis Date  Arthritis  Depression  Hyperlipidemia  Hypertension   Past Surgical History: Past Surgical History:  Procedure Laterality Date  CESAREAN SECTION 08/21/1969  TUBAL LIGATION 08/21/1973  C3-4 ANTERIOR CERVICAL DECOMPRESSION/DISCECTOMY FUSION WITH HEDRON 07/08/2021  Dr. Meade Maw at [facility], [place] T11-12 [place] 09/19/2021  Dr. Meade Maw at [facility], [place]  Left total knee arthroplasty using computer-assisted navigation 06/09/13  right rotator cuff surgery, 2006   Past Family History: Family History  Problem Relation Age of Onset  High blood pressure (Hypertension) Mother  Myocardial Infarction (Heart attack) Mother   Medications: Current Outpatient Medications Ordered in Epic  Medication Sig Dispense Refill  acetaminophen (TYLENOL) 500 mg capsule Take 500-1,000 mg by mouth every 6 (six) hours  atenolol (TENORMIN) 100 MG tablet Take 100 mg by mouth once daily.  CALCIUM ORAL Take 1 caplet by mouth once daily.    cholecalciferol (VITAMIN D3) 1,250 mcg (50,000 unit) capsule Take 50,000 Units by mouth once a week  diphenhydrAMINE (BENADRYL) 25 mg capsule Take 25 mg by mouth every 6 (six) hours as needed for Itching  DULoxetine (CYMBALTA) 60 MG DR capsule Take 60 mg by mouth once daily  gabapentin (NEURONTIN) 600 MG tablet Take 600 mg by mouth 2 (two) times daily  hydroCHLOROthiazide (HYDRODIURIL) 25 MG tablet Take 25 mg by mouth once daily  lidocaine (SALONPAS) 4 % patch Place 1 patch onto the skin daily Apply patch to the most painful area for upt to 12 hours in a 24 hours period.  losartan (COZAAR) 100 MG tablet Take 100 mg by mouth once daily.  meloxicam (MOBIC) 15 MG tablet Take 1 tablet (15 mg total) by mouth once daily 30 tablet 0  methocarbamoL (ROBAXIN) 500 MG tablet Take 500 mg by mouth 4 (four) times daily  miscellaneous medical supply Misc  polyethylene glycol (MIRALAX) packet Take 17 g by mouth once daily Mix in 4-8ounces of fluid prior to taking.  rosuvastatin (CRESTOR) 20 MG tablet Take 20 mg by mouth once daily  sennosides-docusate (SENOKOT-S) 8.6-50 mg tablet Take 1 tablet by mouth 2 (two) times daily  simvastatin (ZOCOR) 20 MG tablet Take 20 mg by mouth nightly.  tiZANidine (ZANAFLEX) 4 MG tablet Take 4 mg by mouth 2 (two) times daily   No current Epic-ordered facility-administered medications on file.   Allergies: No Known Allergies   Review of Systems:  A comprehensive 14 point ROS was performed, reviewed, and the pertinent orthopaedic findings are documented in the HPI.  Physical Exam: Vitals:  09/05/22 0951  BP: 122/84  General:  Well developed,  well nourished, no apparent distress, presents in a wheelchair.  HEENT: Head normocephalic, atraumatic, PERRL.   Abdomen: Soft, non tender, non distended, Bowel sounds present.  Heart: Examination of the heart reveals regular, rate, and rhythm. There is no murmur noted on ascultation. There is a normal apical  pulse.  Lungs: Lungs are clear to auscultation. There is no wheeze, rhonchi, or crackles. There is normal expansion of bilateral chest walls.   Comprehensive Knee Exam: Gait Non-antalgic and fluid  Alignment Neutral   Inspection Right Left  Skin Normal appearance with no obvious deformity. No ecchymosis or erythema. Old midline incision  Soft Tissue No focal soft tissue swelling No focal soft tissue swelling  Quad Atrophy None None   Palpation  Right Left  Tenderness Medial lateral joint line tenderness to palpation No peripatellar, patellar tendon, quad tendon, medial/lateral joint line pain  Crepitus + patellofemoral and tibiofemoral crepitus No patellofemoral or tibiofemoral crepitus  Effusion None None   Range of Motion             Right      Left  Flexion 5-90      5-115  Extension 5 degree flexion contracture without hyperextension Full knee extension without hyperextension   Ligamentous Exam Right Left  Lachman Normal Normal  Valgus 0 Normal Normal  Valgus 30 Normal Normal  Varus 0 Normal Normal  Varus 30 Normal Normal  Anterior Drawer Normal Normal  Posterior Drawer Normal Normal   Meniscal Exam Right Left  Hyperflexion Test Positive Negative  Hyperextension Test Positive Negative  McMurray's Positive Negative    Neurovascular Right Left  Quadriceps Strength 5/5 5/5  Hamstring Strength 5/5 5/5  Hip Abductor Strength 4/5 4/5  Distal Motor Normal Normal  Distal Sensory Normal light touch sensation Normal light touch sensation  Distal Pulses Normal Normal    Imaging Studies: I have reviewed AP, lateral,sunrise, and flexed PA weight bearing knee X-rays (4 views) of the right knee these x-rays show show severe degenerative changes with medial and lateral joint space narrowing with bone-on-bone articulation in both compartments as well as in the patellofemoral compartment. There is osteophyte formation and sclerosis, with subchondral cyst formation. No  fractures or dislocations noted. the left knee which is status post total knee replacement with components in appropriate appearing position with no evidence of periprosthetic loosening or fracture. There is lateral erosion of the lateral facet of the patella adjacent to the previous resurfacing with bony overgrowth over the lateral condyle.  Assessment:  Right knee osteoarthritis  Plan: Diara is a 72 year old female who presents with right knee bone on bone arthritis. Patient has been having years of progressive right knee pain is interfering with her quality of life and activities of daily living. She can barely walk. She has swelling and instability throughout the right knee. She is currently using a walker. She has failed all forms of conservative treatment and has seen Dr. Audelia Acton agreed and consented to a right total knee arthroplasty. Risks, benefits, complications of a right total knee arthroplasty have been discussed with the patient. Patient has agreed and consented procedure with Dr. Audelia Acton on 09/11/2022  The hospitalization and post-operative care and rehabilitation were also discussed. The use of perioperative antibiotics and DVT prophylaxis were discussed. The risk, benefits and alternatives to a surgical intervention were discussed at length with the patient. The patient was also advised of risks related to the medical comorbidities and elevated body mass index (BMI). A lengthy discussion took place to review the  most common complications including but not limited to: stiffness, loss of function, complex regional pain syndrome, deep vein thrombosis, pulmonary embolus, heart attack, stroke, infection, wound breakdown, numbness, damage to nerves, tendon,muscles, arteries or other blood vessels, death and other possible complications from anesthesia. The patient was told that we will take steps to minimize these risks by using sterile technique, antibiotics and DVT prophylaxis when appropriate and  follow the patient postoperatively in the office setting to monitor progress. The possibility of recurrent pain, no improvement in pain and actual worsening of pain were also discussed with the patient.   The discharge plan of care focused on the patient going to skilled nursing facility following surgery. Patient lives at home alone with no assistance.   History and physical updated on day of surgery, risks, benefits, and alternatives reviewed. patient agrees with above plan to proceed with right total knee arthroplasty.   Diana Green

## 2022-09-11 NOTE — Anesthesia Preprocedure Evaluation (Addendum)
Anesthesia Evaluation  Patient identified by MRN, date of birth, ID band Patient awake    Reviewed: Allergy & Precautions, H&P , NPO status , Patient's Chart, lab work & pertinent test results, reviewed documented beta blocker date and time   Airway Mallampati: II  TM Distance: >3 FB Neck ROM: full    Dental  (+) Edentulous Upper, Missing   Pulmonary shortness of breath and with exertion   Pulmonary exam normal        Cardiovascular Exercise Tolerance: Poor hypertension, Pt. on home beta blockers Normal cardiovascular exam     Neuro/Psych  PSYCHIATRIC DISORDERS  Depression    negative neurological ROS     GI/Hepatic Neg liver ROS,,,  Endo/Other  negative endocrine ROS    Renal/GU Renal InsufficiencyRenal disease     Musculoskeletal  (+) Arthritis ,  Cervical myelopathy (HCC) Thoracic myelopathy     Abdominal  (+) + obese  Peds  Hematology negative hematology ROS (+)   Anesthesia Other Findings Past Medical History: No date: Arthritis No date: Depression No date: GERD (gastroesophageal reflux disease)     Comment:  occ no meds No date: High cholesterol No date: Hypertension  Past Surgical History: 07/08/2021: ANTERIOR CERVICAL DECOMP/DISCECTOMY FUSION; N/A     Comment:  Procedure: C3-4 ANTERIOR CERVICAL               DECOMPRESSION/DISCECTOMY FUSION 1 LEVEL WITH HEDRON;                Surgeon: Meade Maw, MD;  Location: [facility] ORS;                Service: Neurosurgery;  Laterality: N/A; 16/05/9603: APPLICATION OF INTRAOPERATIVE CT SCAN; N/A     Comment:  Procedure: APPLICATION OF INTRAOPERATIVE CT SCAN;                Surgeon: Meade Maw, MD;  Location: [facility] ORS;                Service: Neurosurgery;  Laterality: N/A; 54/04/8118: APPLICATION OF WOUND VAC     Comment:  Procedure: APPLICATION OF WOUND VAC;  Surgeon:               Meade Maw, MD;  Location: [facility] ORS;  Service:                Neurosurgery;;  [place] No date: BACK SURGERY     Comment:  thoracic 1971: CESAREAN SECTION No date: CHOLECYSTECTOMY No date: COLONOSCOPY 2006: [place]; Right 2014: TOTAL KNEE ARTHROPLASTY; Left 1975: TUBAL LIGATION  BMI    Body Mass Index: 39.13 kg/m      Reproductive/Obstetrics negative OB ROS                             Anesthesia Physical Anesthesia Plan  ASA: 3  Anesthesia Plan: General   Post-op Pain Management: Regional block* and Ofirmev IV (intra-op)*   Induction: Intravenous  PONV Risk Score and Plan: Ondansetron, Dexamethasone and Treatment may vary due to age or medical condition  Airway Management Planned: Oral ETT  Additional Equipment:   Intra-op Plan:   Post-operative Plan: Extubation in OR  Informed Consent: I have reviewed the patients History and Physical, chart, labs and discussed the procedure including the risks, benefits and alternatives for the proposed anesthesia with the patient or authorized representative who has indicated his/her understanding and acceptance.     Dental Advisory Given  Plan Discussed  with: CRNA and Surgeon  Anesthesia Plan Comments:         Anesthesia Quick Evaluation

## 2022-09-12 ENCOUNTER — Encounter: Payer: Self-pay | Admitting: Orthopedic Surgery

## 2022-09-12 DIAGNOSIS — R278 Other lack of coordination: Secondary | ICD-10-CM | POA: Diagnosis not present

## 2022-09-12 DIAGNOSIS — Z23 Encounter for immunization: Secondary | ICD-10-CM | POA: Diagnosis not present

## 2022-09-12 DIAGNOSIS — Z79899 Other long term (current) drug therapy: Secondary | ICD-10-CM | POA: Diagnosis not present

## 2022-09-12 DIAGNOSIS — R2689 Other abnormalities of gait and mobility: Secondary | ICD-10-CM | POA: Diagnosis not present

## 2022-09-12 DIAGNOSIS — M1711 Unilateral primary osteoarthritis, right knee: Secondary | ICD-10-CM | POA: Diagnosis not present

## 2022-09-12 DIAGNOSIS — I10 Essential (primary) hypertension: Secondary | ICD-10-CM | POA: Diagnosis not present

## 2022-09-12 DIAGNOSIS — M1990 Unspecified osteoarthritis, unspecified site: Secondary | ICD-10-CM | POA: Diagnosis not present

## 2022-09-12 DIAGNOSIS — F32A Depression, unspecified: Secondary | ICD-10-CM | POA: Diagnosis not present

## 2022-09-12 DIAGNOSIS — Z96652 Presence of left artificial knee joint: Secondary | ICD-10-CM | POA: Diagnosis not present

## 2022-09-12 DIAGNOSIS — Z7185 Encounter for immunization safety counseling: Secondary | ICD-10-CM | POA: Diagnosis not present

## 2022-09-12 DIAGNOSIS — E119 Type 2 diabetes mellitus without complications: Secondary | ICD-10-CM | POA: Diagnosis not present

## 2022-09-12 DIAGNOSIS — M6281 Muscle weakness (generalized): Secondary | ICD-10-CM | POA: Diagnosis not present

## 2022-09-12 DIAGNOSIS — E7849 Other hyperlipidemia: Secondary | ICD-10-CM | POA: Diagnosis not present

## 2022-09-12 DIAGNOSIS — Z96651 Presence of right artificial knee joint: Secondary | ICD-10-CM | POA: Diagnosis not present

## 2022-09-12 DIAGNOSIS — Z7401 Bed confinement status: Secondary | ICD-10-CM | POA: Diagnosis not present

## 2022-09-12 DIAGNOSIS — R531 Weakness: Secondary | ICD-10-CM | POA: Diagnosis not present

## 2022-09-12 LAB — CBC
HCT: 28.9 % — ABNORMAL LOW (ref 36.0–46.0)
Hemoglobin: 9.4 g/dL — ABNORMAL LOW (ref 12.0–15.0)
MCH: 29.5 pg (ref 26.0–34.0)
MCHC: 32.5 g/dL (ref 30.0–36.0)
MCV: 90.6 fL (ref 80.0–100.0)
Platelets: 191 10*3/uL (ref 150–400)
RBC: 3.19 MIL/uL — ABNORMAL LOW (ref 3.87–5.11)
RDW: 12.7 % (ref 11.5–15.5)
WBC: 11.4 10*3/uL — ABNORMAL HIGH (ref 4.0–10.5)
nRBC: 0 % (ref 0.0–0.2)

## 2022-09-12 LAB — BASIC METABOLIC PANEL
Anion gap: 5 (ref 5–15)
BUN: 27 mg/dL — ABNORMAL HIGH (ref 8–23)
CO2: 26 mmol/L (ref 22–32)
Calcium: 9 mg/dL (ref 8.9–10.3)
Chloride: 106 mmol/L (ref 98–111)
Creatinine, Ser: 1.42 mg/dL — ABNORMAL HIGH (ref 0.44–1.00)
GFR, Estimated: 40 mL/min — ABNORMAL LOW (ref >60)
Glucose, Bld: 148 mg/dL — ABNORMAL HIGH (ref 70–99)
Potassium: 4.3 mmol/L (ref 3.5–5.1)
Sodium: 137 mmol/L (ref 135–145)

## 2022-09-12 MED ORDER — TRAMADOL HCL 50 MG PO TABS: 50.0000 mg | ORAL_TABLET | Freq: Four times a day (QID) | ORAL | 0 refills | Status: DC | PRN

## 2022-09-12 MED ORDER — CELECOXIB 100 MG PO CAPS: 100.0000 mg | ORAL_CAPSULE | Freq: Every day | ORAL | 0 refills | Status: AC

## 2022-09-12 MED ORDER — ACETAMINOPHEN 500 MG PO TABS
ORAL_TABLET | ORAL | Status: AC
  Filled 2022-09-12: qty 2

## 2022-09-12 MED ORDER — ENOXAPARIN SODIUM 30 MG/0.3ML IJ SOSY
PREFILLED_SYRINGE | INTRAMUSCULAR | Status: AC
  Filled 2022-09-12: qty 0.3

## 2022-09-12 MED ORDER — HYDROCHLOROTHIAZIDE 25 MG PO TABS
ORAL_TABLET | ORAL | Status: AC
  Filled 2022-09-12: qty 1

## 2022-09-12 MED ORDER — PANTOPRAZOLE SODIUM 40 MG PO TBEC
DELAYED_RELEASE_TABLET | ORAL | Status: AC
  Filled 2022-09-12: qty 1

## 2022-09-12 MED ORDER — ENOXAPARIN SODIUM 40 MG/0.4ML IJ SOSY: 40.0000 mg | PREFILLED_SYRINGE | INTRAMUSCULAR | 0 refills | Status: DC

## 2022-09-12 MED ORDER — KETOROLAC TROMETHAMINE 15 MG/ML IJ SOLN
INTRAMUSCULAR | Status: AC
  Filled 2022-09-12: qty 1

## 2022-09-12 MED ORDER — GABAPENTIN 300 MG PO CAPS
ORAL_CAPSULE | ORAL | Status: AC
  Filled 2022-09-12: qty 2

## 2022-09-12 MED ORDER — DOCUSATE SODIUM 100 MG PO CAPS
ORAL_CAPSULE | ORAL | Status: AC
  Filled 2022-09-12: qty 1

## 2022-09-12 MED ORDER — ACETAMINOPHEN 500 MG PO TABS
ORAL_TABLET | ORAL | Status: AC
  Administered 2022-09-12: 1000 mg
  Filled 2022-09-12: qty 2

## 2022-09-12 MED ORDER — ATENOLOL 50 MG PO TABS
ORAL_TABLET | ORAL | Status: AC
  Filled 2022-09-12: qty 2

## 2022-09-12 MED ORDER — LOSARTAN POTASSIUM 50 MG PO TABS
ORAL_TABLET | ORAL | Status: AC
  Filled 2022-09-12: qty 2

## 2022-09-12 NOTE — TOC Progression Note (Signed)
Transition of Care [facility]) - Progression Note    Patient Details  Name: Diana Green MRN: [id number] Date of Birth: [birth year]  Transition of Care [facility]) CM/SW [place], RN Phone Number: 09/12/2022, 2:51 PM  Clinical Narrative:     Called EMS to transport the patient to [facility] room 208 The patient will notify her family There are several ahead of her       Expected Discharge Plan and Services         Expected Discharge Date: 09/12/22                                     Social Determinants of Health (SDOH) Interventions SDOH Screenings   Food Insecurity: No Food Insecurity (09/11/2022)  Housing: Low Risk  (09/11/2022)  Transportation Needs: No Transportation Needs (09/11/2022)  Utilities: Not At Risk (09/11/2022)  Tobacco Use: Low Risk  (09/12/2022)    Readmission Risk Interventions     No data to display

## 2022-09-12 NOTE — TOC Progression Note (Signed)
Transition of Care [facility]) - Progression Note    Patient Details  Name: Diana Green MRN: [id number] Date of Birth: 1951/06/08  Transition of Care [facility]) CM/SW [place], RN Phone Number: 09/12/2022, 1:35 PM  Clinical Narrative:    Reviewed the bed offers with the patient and the Medicare Star rating. She chose [facility] I notified Miquel Dunn, [place] auth Pending         Expected Discharge Plan and Services                                               Social Determinants of Health (SDOH) Interventions SDOH Screenings   Food Insecurity: No Food Insecurity (09/11/2022)  Housing: Low Risk  (09/11/2022)  Transportation Needs: No Transportation Needs (09/11/2022)  Utilities: Not At Risk (09/11/2022)  Tobacco Use: Low Risk  (09/12/2022)    Readmission Risk Interventions     No data to display

## 2022-09-12 NOTE — Progress Notes (Signed)
Gave verbal report to  Awo, Therapist, sports at [employer] via telephone.  Pt will be transferred later today via EMS.

## 2022-09-12 NOTE — NC FL2 (Signed)
[place] LEVEL OF CARE FORM     IDENTIFICATION  Patient Name: Diana Green Birthdate: [birth year] Sex: female Admission Date (Current Location): 09/11/2022  [place] and [place] Number:  Engineering geologist and Address:  [facility], [address], [place], [place] [address]      Provider Number: [id number]  Attending Physician Name and Address:  Steffanie Rainwater, MD  Relative Name and Phone Number:       Current Level of Care: Hospital Recommended Level of Care: [place] Prior Approval Number:    Date Approved/Denied:   PASRR Number: [id number]  Discharge Plan: SNF    Current Diagnoses: Patient Active Problem List   Diagnosis Date Noted   S/P TKR (total knee replacement) using cement, right 09/11/2022   Thoracic myelopathy 09/19/2021   Cervical myelopathy ([place]) 07/08/2021    Orientation RESPIRATION BLADDER Height & Weight     Self, Time, Situation, Place  Normal Continent Weight: 264 lb 15.9 oz (120.2 kg) Height:  5\' 9"  (175.3 cm)  BEHAVIORAL SYMPTOMS/MOOD NEUROLOGICAL BOWEL NUTRITION STATUS   (None)  (None) Continent Diet (Regular)  AMBULATORY STATUS COMMUNICATION OF NEEDS Skin   Limited Assist Verbally Surgical wounds (Incision on right knee: Honeycomb dressing.)                       Personal Care Assistance Level of Assistance  Bathing, Feeding, Dressing Bathing Assistance: Limited assistance Feeding assistance: Independent Dressing Assistance: Limited assistance     Functional Limitations Info  Sight, Hearing, Speech Sight Info: Adequate Hearing Info: Adequate Speech Info: Adequate    SPECIAL CARE FACTORS FREQUENCY  PT (By licensed PT), OT (By licensed OT)     PT Frequency: 5 x week OT Frequency: 5 x week            Contractures Contractures Info: Not present    Additional Factors Info  Code Status, Allergies Code Status Info: Full code Allergies Info: NKDA            Current Medications (09/12/2022):  This is the current hospital active medication list Current Facility-Administered Medications  Medication Dose Route Frequency Provider Last Rate Last Admin   0.9 %  sodium chloride infusion   Intravenous Continuous Steffanie Rainwater, MD 100 mL/hr at 09/11/22 1335 New Bag at 09/11/22 1335   acetaminophen (TYLENOL) tablet 1,000 mg  1,000 mg Oral Q8H Steffanie Rainwater, MD   1,000 mg at 09/12/22 0519   atenolol (TENORMIN) 50 MG tablet            atenolol (TENORMIN) tablet 100 mg  100 mg Oral q AM Steffanie Rainwater, MD   100 mg at 09/12/22 0916   docusate sodium (COLACE) 100 MG capsule            docusate sodium (COLACE) capsule 100 mg  100 mg Oral BID Steffanie Rainwater, MD   100 mg at 09/12/22 8937   DULoxetine (CYMBALTA) DR capsule 60 mg  60 mg Oral q AM Steffanie Rainwater, MD       enoxaparin (LOVENOX) 30 MG/0.3ML injection            enoxaparin (LOVENOX) injection 30 mg  30 mg Subcutaneous Q12H Steffanie Rainwater, MD   30 mg at 09/12/22 0916   gabapentin (NEURONTIN) 300 MG capsule            gabapentin (NEURONTIN) capsule 600 mg  600 mg Oral BID Steffanie Rainwater, MD   600 mg at 09/12/22  3893   hydrochlorothiazide (HYDRODIURIL) 25 MG tablet            hydrochlorothiazide (HYDRODIURIL) tablet 25 mg  25 mg Oral q AM Steffanie Rainwater, MD   25 mg at 09/12/22 7342   HYDROcodone-acetaminophen (NORCO/VICODIN) 5-325 MG per tablet 1-2 tablet  1-2 tablet Oral Q4H PRN Steffanie Rainwater, MD       losartan (COZAAR) 50 MG tablet            losartan (COZAAR) tablet 100 mg  100 mg Oral Daily Steffanie Rainwater, MD   100 mg at 09/12/22 8768   menthol-cetylpyridinium (CEPACOL) lozenge 3 mg  1 lozenge Oral PRN Steffanie Rainwater, MD       Or   phenol (CHLORASEPTIC) mouth spray 1 spray  1 spray Mouth/Throat PRN Steffanie Rainwater, MD       metoCLOPramide (REGLAN) tablet 5-10 mg  5-10 mg Oral Q8H PRN Steffanie Rainwater, MD       Or   metoCLOPramide (REGLAN) injection 5-10 mg  5-10 mg  Intravenous Q8H PRN Steffanie Rainwater, MD       morphine (PF) 4 MG/ML injection 0.52-1 mg  0.52-1 mg Intravenous Q2H PRN Steffanie Rainwater, MD       ondansetron (ZOFRAN) tablet 4 mg  4 mg Oral Q6H PRN Steffanie Rainwater, MD       Or   ondansetron (ZOFRAN) injection 4 mg  4 mg Intravenous Q6H PRN Steffanie Rainwater, MD       Oral care mouth rinse  15 mL Mouth Rinse PRN Steffanie Rainwater, MD       pantoprazole (PROTONIX) 40 MG EC tablet            pantoprazole (PROTONIX) EC tablet 40 mg  40 mg Oral Daily Steffanie Rainwater, MD   40 mg at 09/12/22 1157   senna (SENOKOT) tablet 8.6 mg  1 tablet Oral QHS Steffanie Rainwater, MD   8.6 mg at 09/11/22 2200   simvastatin (ZOCOR) tablet 20 mg  20 mg Oral QHS Steffanie Rainwater, MD   20 mg at 09/11/22 2200   traMADol (ULTRAM) tablet 50 mg  50 mg Oral Q6H PRN Steffanie Rainwater, MD   50 mg at 09/11/22 1901     Discharge Medications: Please see discharge summary for a list of discharge medications.  Relevant Imaging Results:  Relevant Lab Results:   Additional Information SS#: [id number]  Candie Chroman, LCSW

## 2022-09-12 NOTE — TOC Progression Note (Addendum)
Transition of Care [facility]) - Progression Note    Patient Details  Name: GWENDOLYNN MERKEY MRN: [id number] Date of Birth: 1951/05/20  Transition [facility]) CM/SW [place], RN Phone Number: 09/12/2022, 2:27 PM  Clinical Narrative:    Submitted for Ins auth and it was approved to go to [place] Ref number [id number] 1/23-25, notified the clinical team and the patient as well as Miquel Dunn, The patient states that they will need EMS to transport        Expected Discharge Plan and Services                                               Social Determinants of Health (SDOH) Interventions SDOH Screenings   Food Insecurity: No Food Insecurity (09/11/2022)  Housing: Low Risk  (09/11/2022)  Transportation Needs: No Transportation Needs (09/11/2022)  Utilities: Not At Risk (09/11/2022)  Tobacco Use: Low Risk  (09/12/2022)    Readmission Risk Interventions     No data to display

## 2022-09-12 NOTE — Discharge Summary (Signed)
Physician Discharge Summary  Patient ID: Diana Green MRN: [id number] DOB/AGE: July 28, 1951 72 y.o.  Admit date: 09/11/2022 Discharge date: 09/12/2022  Admission Diagnoses:  S/P TKR (total knee replacement) using cement, right [Z96.651]   Discharge Diagnoses: Patient Active Problem List   Diagnosis Date Noted   S/P TKR (total knee replacement) using cement, right 09/11/2022   Thoracic myelopathy 09/19/2021   Cervical myelopathy ([place]) 07/08/2021    Past Medical History:  Diagnosis Date   Arthritis    Depression    GERD (gastroesophageal reflux disease)    occ no meds   High cholesterol    Hypertension      Transfusion: none   Consultants (if any):   Discharged Condition: Improved  Hospital Course: Diana Green is an 72 y.o. female who was admitted 09/11/2022 with a diagnosis of S/P TKR (total knee replacement) using cement, right and went to the operating room on 09/11/2022 and underwent the above named procedures.    Surgeries: Procedure(s): TOTAL KNEE ARTHROPLASTY on 09/11/2022 Patient tolerated the surgery well. Taken to PACU where she was stabilized and then transferred to the orthopedic floor.  Started on Lovenox 30 mg q 12 hrs. TEDs and SCDs applied bilaterally. Heels elevated on bed. No evidence of DVT. Negative Homan. Physical therapy started on day #1 for gait training and transfer. OT started day #1 for ADL and assisted devices. Patient's IV was d/c on day #1. Pain controlled. Vital signs stable. Slow progress with PT, unable to return home safely. PT recommended SNF. On post op day #1 patient was stable and ready for discharge to SNF.  Implants:  Femur: Persona Size 8 CR   Tibia: Persona Size G w/ 14x61mm stem extension  Poly: 64mm MC  Patella: 35x63mm symmetrci   She was given perioperative antibiotics:  Anti-infectives (From admission, onward)    Start     Dose/Rate Route Frequency Ordered Stop   09/11/22 2021  ceFAZolin (ANCEF) 2-4 GM/100ML-% IVPB       Note to  Pharmacy: Register, Santiago Glad A: cabinet override      09/11/22 2021 09/11/22 2200   09/11/22 1400  ceFAZolin (ANCEF) IVPB 3g/100 mL premix        3 g 200 mL/hr over 30 Minutes Intravenous Every 8 hours 09/11/22 1229 09/11/22 2233   09/11/22 1330  ceFAZolin (ANCEF) 2-4 GM/100ML-% IVPB       Note to Pharmacy: Phineas Real L: cabinet override      09/11/22 1330 09/11/22 1419   09/11/22 0600  ceFAZolin (ANCEF) IVPB 3g/100 mL premix        3 g 200 mL/hr over 30 Minutes Intravenous On call to O.R. 09/10/22 2256 09/11/22 0810     .  She was given sequential compression devices, early ambulation, and Lovenox TEDs for DVT prophylaxis.  She benefited maximally from the hospital stay and there were no complications.    Recent vital signs:  Vitals:   09/12/22 0521 09/12/22 0758  BP: 117/60 128/68  Pulse: (!) 58 (!) 56  Resp: 18 18  Temp: 98.5 F (36.9 C) 97.8 F (36.6 C)  SpO2: 97% 95%    Recent laboratory studies:  Lab Results  Component Value Date   HGB 9.4 (L) 09/12/2022   HGB 10.7 (L) 09/05/2022   HGB 9.9 (L) 09/20/2021   Lab Results  Component Value Date   WBC 11.4 (H) 09/12/2022   PLT 191 09/12/2022   Lab Results  Component Value Date   INR 1.0 06/28/2021  Lab Results  Component Value Date   NA 137 09/12/2022   K 4.3 09/12/2022   CL 106 09/12/2022   CO2 26 09/12/2022   BUN 27 (H) 09/12/2022   CREATININE 1.42 (H) 09/12/2022   GLUCOSE 148 (H) 09/12/2022    Discharge Medications:   Allergies as of 09/12/2022   No Known Allergies      Medication List     TAKE these medications    acetaminophen 500 MG tablet Commonly known as: TYLENOL Take 1,000 mg by mouth 3 (three) times daily.   atenolol 100 MG tablet Commonly known as: TENORMIN Take 100 mg by mouth in the morning.   celecoxib 100 MG capsule Commonly known as: CeleBREX Take 1 capsule (100 mg total) by mouth daily for 7 days.   DULoxetine 60 MG capsule Commonly known as: CYMBALTA Take 60 mg by  mouth in the morning.   enoxaparin 40 MG/0.4ML injection Commonly known as: LOVENOX Inject 0.4 mLs (40 mg total) into the skin daily for 14 days.   gabapentin 600 MG tablet Commonly known as: NEURONTIN Take 600 mg by mouth 2 (two) times daily.   hydrochlorothiazide 25 MG tablet Commonly known as: HYDRODIURIL Take 25 mg by mouth in the morning.   lidocaine 4 % Place 1 patch onto the skin daily as needed (back pain).   losartan 100 MG tablet Commonly known as: COZAAR Take 100 mg by mouth at bedtime.   methocarbamol 500 MG tablet Commonly known as: ROBAXIN Take 1 tablet (500 mg total) by mouth every 6 (six) hours as needed for muscle spasms.   senna 8.6 MG Tabs tablet Commonly known as: SENOKOT Take 1 tablet (8.6 mg total) by mouth 2 (two) times daily. What changed: when to take this   simvastatin 20 MG tablet Commonly known as: ZOCOR Take 20 mg by mouth at bedtime.   traMADol 50 MG tablet Commonly known as: ULTRAM Take 1-2 tablets (50-100 mg total) by mouth every 6 (six) hours as needed for moderate pain.   Vitamin D (Ergocalciferol) 1.25 MG (50000 UNIT) Caps capsule Commonly known as: DRISDOL Take 50,000 Units by mouth every 7 (seven) days.        Diagnostic Studies: DG Knee Right Port  Result Date: 09/11/2022 CLINICAL DATA:  Status post right total knee replacement. EXAM: PORTABLE RIGHT KNEE - 1-2 VIEW COMPARISON:  None Available. FINDINGS: Right femoral and tibial components are well situated. Expected postoperative changes are noted in the soft tissues anteriorly. IMPRESSION: Status post right total knee arthroplasty. Electronically Signed   By: Marijo Conception M.D.   On: 09/11/2022 10:53    Disposition:      Contact information for follow-up providers     Duanne Guess, PA-C Follow up in 2 week(s).   Specialties: Orthopedic Surgery, Emergency Medicine Contact information: [place] [address] [phone]               Contact information for after-discharge care     [facility] .   Service: Skilled Nursing Contact information: [address] [place] [phone]                      Signed: Feliberto Gottron 09/12/2022, 2:44 PM

## 2022-09-12 NOTE — Progress Notes (Signed)
Pt d/c via EMS to [facility] rehab.  VSS, denies pain.  Safety maintained.

## 2022-09-12 NOTE — Evaluation (Signed)
Occupational Therapy Evaluation Patient Details Name: Diana Green MRN: [id number] DOB: May 06, 1951 Today's Date: 09/12/2022   History of Present Illness Pt admitted for R TKR; POD 0 at time of evaluation. History of depression, HLD, and HTN.   Clinical Impression   Pt seen for OT evaluation this date, POD#1 from above surgery. Pt was independent in ADLs prior to surgery, however unable to drive and using RW for mobility due to R knee pain. Pt is eager to return to PLOF with less pain and improved safety and independence. Pt currently requires Min-Mod A for bed mobility, Max A for sit<stand, Mod A for transfers to chair/BSC, Max A for LB dressing. Pt instructed in safe use of RW, polar care mgt, falls prevention strategies, home/routines modifications, and techniques for LB bathing and dressing tasks. Given significant difference between pt's PLOF and current fxl mobility, high level of assistance pt requires as present, plus lack of a caregiver at home, recommend DC to SNF to allow pt to improve balance, endurance, strength and to minimize falls risk prior to returning home.    Recommendations for follow up therapy are one component of a multi-disciplinary discharge planning process, led by the attending physician.  Recommendations may be updated based on patient status, additional functional criteria and insurance authorization.   Follow Up Recommendations  Skilled nursing-short term rehab (<3 hours/day)     Assistance Recommended at Discharge Frequent or constant Supervision/Assistance  Patient can return home with the following A little help with walking and/or transfers;A lot of help with bathing/dressing/bathroom;Assistance with cooking/housework;Assist for transportation;Help with stairs or ramp for entrance    Functional Status Assessment  Patient has had a recent decline in their functional status and demonstrates the ability to make significant improvements in function in a reasonable  and predictable amount of time.  Equipment Recommendations  None recommended by OT    Recommendations for Other Services       Precautions / Restrictions Precautions Precautions: Fall Restrictions Weight Bearing Restrictions: Yes RLE Weight Bearing: Weight bearing as tolerated      Mobility Bed Mobility Overal bed mobility: Needs Assistance Bed Mobility: Supine to Sit     Supine to sit: Min assist     General bed mobility comments: needs cues for sequencing. Once seated at EOB, upright posture noted.    Transfers Overall transfer level: Needs assistance Equipment used: Rolling walker (2 wheels) Transfers: Sit to/from Stand, Bed to chair/wheelchair/BSC Sit to Stand: From elevated surface, Max assist     Step pivot transfers: Mod assist     General transfer comment: Max A for standing, VCs for hand placement on RW. Poor eccentric control during stand<sit.      Balance Overall balance assessment: Needs assistance Sitting-balance support: Feet supported Sitting balance-Leahy Scale: Good     Standing balance support: Bilateral upper extremity supported, During functional activity, Reliant on assistive device for balance Standing balance-Leahy Scale: Poor                             ADL either performed or assessed with clinical judgement   ADL Overall ADL's : Needs assistance/impaired Eating/Feeding: Independent                   Lower Body Dressing: Maximal assistance                       Vision  Perception     Praxis      Pertinent Vitals/Pain Pain Assessment Pain Assessment: 0-10 Pain Score: 6  Pain Location: R knee Pain Descriptors / Indicators: Operative site guarding, Aching Pain Intervention(s): Limited activity within patient's tolerance, Repositioned, Monitored during session, Ice applied     Hand Dominance Right   Extremity/Trunk Assessment Upper Extremity Assessment Upper Extremity Assessment:  Overall WFL for tasks assessed   Lower Extremity Assessment Lower Extremity Assessment: Generalized weakness       Communication Communication Communication: No difficulties   Cognition Arousal/Alertness: Awake/alert Behavior During Therapy: WFL for tasks assessed/performed Overall Cognitive Status: Within Functional Limits for tasks assessed                                       General Comments       Exercises Other Exercises Other Exercises: Educ re: LB dressing, polar care mgmt, WB precautions, DC recs   Shoulder Instructions      Home Living Family/patient expects to be discharged to:: Private residence Living Arrangements: Spouse/significant other Available Help at Discharge: Family;Available PRN/intermittently Type of Home: House Home Access: Stairs to enter [employer] of Steps: 5 Entrance Stairs-Rails: Can reach both Home Layout: One level     Bathroom Shower/Tub: Teacher, early years/pre: Handicapped height ((w/ BSC over standard toilet)) Bathroom Accessibility: No   Home Equipment: Conservation officer, nature (2 wheels)          Prior Functioning/Environment Prior Level of Function : Independent/Modified Independent             Mobility Comments: was using RW prior to admission ADLs Comments: IND w/ bed mobility, dressing, toileting. Has stopped driving -- friends and family assist with transport and shopping. 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        OT Problem List: Decreased strength;Decreased range of motion;Decreased activity tolerance;Impaired balance (sitting and/or standing);Pain;Decreased knowledge of use of DME or AE;Obesity      OT Treatment/Interventions: Self-care/ADL training;Therapeutic exercise;Patient/family education;Balance training;Therapeutic activities;DME and/or AE instruction    OT Goals(Current goals can be found in the care plan section) Acute Rehab OT Goals Patient Stated Goal: to get back  to driving so that she can travel to see family and friends OT Goal Formulation: With patient Time For Goal Achievement: 09/26/22 Potential to Achieve Goals: Good ADL Goals Pt Will Perform Lower Body Dressing: with modified independence;sitting/lateral leans Pt Will Transfer to Toilet: Independently;ambulating;stand pivot transfer Pt Will Perform Toileting - Clothing Manipulation and hygiene: with modified independence  OT Frequency: Min 2X/week    Co-evaluation              AM-PAC OT "6 Clicks" Daily Activity     Outcome Measure Help from another person eating meals?: None Help from another person taking care of personal grooming?: None Help from another person toileting, which includes using toliet, bedpan, or urinal?: A Lot Help from another person bathing (including washing, rinsing, drying)?: A Lot Help from another person to put on and taking off regular upper body clothing?: None Help from another person to put on and taking off regular lower body clothing?: A Lot 6 Click Score: 18   End of Session Equipment Utilized During Treatment: Rolling walker (2 wheels)  Activity Tolerance: Patient tolerated treatment well Patient left: in chair;with call bell/phone within reach  OT Visit Diagnosis: Unsteadiness on feet (R26.81);Pain;Muscle weakness (generalized) (M62.81) Pain -  Right/Left: Right Pain - part of body: Knee                Time: 1107-1130 OT Time Calculation (min): 23 min Charges:  OT General Charges $OT Visit: 1 Visit OT Evaluation $OT Eval Low Complexity: 1 Low OT Treatments $Self Care/Home Management : 8-22 mins Josiah Lobo, PhD, MS, OTR/L 09/12/22, 1:14 PM

## 2022-09-12 NOTE — Progress Notes (Addendum)
   Subjective: 1 Day Post-Op Procedure(s) (LRB): TOTAL KNEE ARTHROPLASTY (Right) Patient reports pain as mild.   Patient is well, and has had no acute complaints or problems Denies any CP, SOB, ABD pain. We will continue therapy today.  Plan is to go Skilled nursing facility after hospital stay.  Objective: Vital signs in last 24 hours: Temp:  [96.7 F (35.9 C)-99 F (37.2 C)] 97.8 F (36.6 C) (01/23 0758) Pulse Rate:  [56-71] 56 (01/23 0758) Resp:  [9-21] 18 (01/23 0758) BP: (117-173)/(60-97) 128/68 (01/23 0758) SpO2:  [94 %-100 %] 95 % (01/23 0758)  Intake/Output from previous day: 01/22 0701 - 01/23 0700 In: 1710 [P.O.:500; I.V.:900; IV Piggyback:310] Out: 575 [Urine:525; Blood:50] Intake/Output this shift: No intake/output data recorded.  Recent Labs    09/12/22 0624  HGB 9.4*   Recent Labs    09/12/22 0624  WBC 11.4*  RBC 3.19*  HCT 28.9*  PLT 191   Recent Labs    09/12/22 0624  NA 137  K 4.3  CL 106  CO2 26  BUN 27*  CREATININE 1.42*  GLUCOSE 148*  CALCIUM 9.0   No results for input(s): "LABPT", "INR" in the last 72 hours.  EXAM General - Patient is Alert, Appropriate, and Oriented Extremity - Neurovascular intact Sensation intact distally Intact pulses distally Dorsiflexion/Plantar flexion intact Dressing - dressing C/D/I and no drainage Motor Function - intact, moving foot and toes well on exam.   Past Medical History:  Diagnosis Date   Arthritis    Depression    GERD (gastroesophageal reflux disease)    occ no meds   High cholesterol    Hypertension     Assessment/Plan:   1 Day Post-Op Procedure(s) (LRB): TOTAL KNEE ARTHROPLASTY (Right) Principal Problem:   S/P TKR (total knee replacement) using cement, right  Estimated body mass index is 39.13 kg/m as calculated from the following:   Height as of this encounter: 5\' 9"  (1.753 m).   Weight as of this encounter: 120.2 kg. Advance diet Up with therapy Pain  controlled VSS Labs stable CM to assist with discharge to SNF  DVT Prophylaxis - Lovenox, TED hose, and SCDs Weight-Bearing as tolerated to right leg   T. Rachelle Hora, PA-C [place] 09/12/2022, 8:35 AM   Patient seen and examined, agree with above plan.  The patient is doing well status post right total knee arthroplasty, no concerns at this time.  Pain is controlled.  Discussed DVT prophylaxis, pain medication use, and safe transition to home.  All questions answered the patient agrees with above plan will go to rehab after discharge per PT recommendations.   Steffanie Rainwater MD

## 2022-09-12 NOTE — Discharge Instructions (Signed)
 Instructions after Total Knee Replacement   Diana Green M.D.     Dept. of Orthopaedics & Sports Medicine  [facility]  [address]  [place], [place]  [address]  Phone: [phone]   Fax: [phone]    DIET: Drink plenty of non-alcoholic fluids. Resume your normal diet. Include foods high in fiber.  ACTIVITY:  You may use crutches or a walker with weight-bearing as tolerated, unless instructed otherwise. You may be weaned off of the walker or crutches by your Physical Therapist.  Do NOT place pillows under the knee. Anything placed under the knee could limit your ability to straighten the knee.   Continue doing gentle exercises. Exercising will reduce the pain and swelling, increase motion, and prevent muscle weakness.   Please continue to use the TED compression stockings for 2 weeks. You may remove the stockings at night, but should reapply them in the morning. Do not drive or operate any equipment until instructed.  WOUND CARE:  Continue to use the PolarCare or ice packs periodically to reduce pain and swelling. You may begin showering 3 days after surgery with honeycomb dressing. Remove honeycomb dressing 7 days after surgery and continue showering. Allow dermabond to fall off on its own.  MEDICATIONS: You may resume your regular medications. Please take the pain medication as prescribed on the medication. Do not take pain medication on an empty stomach. You have been given a prescription for a blood thinner (Lovenox or Coumadin). Please take the medication as instructed. (NOTE: After completing a 2 week course of Lovenox, take one 81 mg Enteric-coated aspirin twice a day. This along with elevation will help reduce the possibility of phlebitis in your operated leg.) Do not drive or drink alcoholic beverages when taking pain medications.  POSTOPERATIVE CONSTIPATION PROTOCOL Constipation - defined medically as fewer than three stools per week and severe  constipation as less than one stool per week.  One of the most common issues patients have following surgery is constipation.  Even if you have a regular bowel pattern at home, your normal regimen is likely to be disrupted due to multiple reasons following surgery.  Combination of anesthesia, postoperative narcotics, change in appetite and fluid intake all can affect your bowels.  In order to avoid complications following surgery, here are some recommendations in order to help you during your recovery period.  Colace (docusate) - Pick up an over-the-counter form of Colace or another stool softener and take twice a day as long as you are requiring postoperative pain medications.  Take with a full glass of water daily.  If you experience loose stools or diarrhea, hold the colace until you stool forms back up.  If your symptoms do not get better within 1 week or if they get worse, check with your doctor.  Dulcolax (bisacodyl) - Pick up over-the-counter and take as directed by the product packaging as needed to assist with the movement of your bowels.  Take with a full glass of water.  Use this product as needed if not relieved by Colace only.   MiraLax (polyethylene glycol) - Pick up over-the-counter to have on hand.  MiraLax is a solution that will increase the amount of water in your bowels to assist with bowel movements.  Take as directed and can mix with a glass of water, juice, soda, coffee, or tea.  Take if you go more than two days without a movement. Do not use MiraLax more than once per day. Call your doctor if   you are still constipated or irregular after using this medication for 7 days in a row.  If you continue to have problems with postoperative constipation, please contact the office for further assistance and recommendations.  If you experience "the worst abdominal pain ever" or develop nausea or vomiting, please contact the office immediatly for further recommendations for  treatment.   CALL THE OFFICE FOR: Temperature above 101 degrees Excessive bleeding or drainage on the dressing. Excessive swelling, coldness, or paleness of the toes. Persistent nausea and vomiting.  FOLLOW-UP:  You should have an appointment to return to the office in 14 days after surgery. Arrangements have been made for continuation of Physical Therapy (either home therapy or outpatient therapy).  

## 2022-09-12 NOTE — TOC Progression Note (Signed)
Transition of Care [facility]) - Progression Note    Patient Details  Name: Diana Green MRN: [id number] Date of Birth: 10/09/1950  Transition of Care [facility]) CM/SW [place], RN Phone Number: 09/12/2022, 12:09 PM  Clinical Narrative:    Met with the patient in the room,  She is agreeable to go to [facility], She would like [facility] if possible but is open to other options        Expected Discharge Plan and Services                                               Social Determinants of Health (SDOH) Interventions SDOH Screenings   Food Insecurity: No Food Insecurity (09/11/2022)  Housing: Low Risk  (09/11/2022)  Transportation Needs: No Transportation Needs (09/11/2022)  Utilities: Not At Risk (09/11/2022)  Tobacco Use: Low Risk  (09/12/2022)    Readmission Risk Interventions     No data to display

## 2022-09-12 NOTE — Anesthesia Postprocedure Evaluation (Signed)
Anesthesia Post Note  Patient: Diana Green  Procedure(s) Performed: TOTAL KNEE ARTHROPLASTY (Right: Knee)  Patient location during evaluation: PACU Anesthesia Type: General Level of consciousness: awake and alert Pain management: pain level controlled Vital Signs Assessment: post-procedure vital signs reviewed and stable Respiratory status: spontaneous breathing, nonlabored ventilation and respiratory function stable Cardiovascular status: blood pressure returned to baseline and stable Postop Assessment: no apparent nausea or vomiting Anesthetic complications: no   No notable events documented.   Last Vitals:  Vitals:   09/11/22 2044 09/12/22 0521  BP: 123/65 117/60  Pulse: 66 (!) 58  Resp: 18 18  Temp: 37.2 C 36.9 C  SpO2: 100% 97%    Last Pain:  Vitals:   09/11/22 2044  TempSrc: Temporal  PainSc:                  [place] Ouch

## 2022-09-12 NOTE — Progress Notes (Signed)
Physical Therapy Treatment Patient Details Name: Diana Green MRN: [id number] DOB: 1951/05/28 Today's Date: 09/12/2022   History of Present Illness Pt admitted for R TKR; POD 0 at time of evaluation. History of depression, HLD, and HTN.    PT Comments    Pt is making limited progress towards goals secondary to weakness. Pt requires elevated surface for safe transfers and demonstrates limited endurance with ambulation. Good progress with ROM and pt able to perform supine there-ex. Will continue to progress.   Recommendations for follow up therapy are one component of a multi-disciplinary discharge planning process, led by the attending physician.  Recommendations may be updated based on patient status, additional functional criteria and insurance authorization.  Follow Up Recommendations  Skilled nursing-short term rehab (<3 hours/day) Can patient physically be transported by private vehicle: No   Assistance Recommended at Discharge Intermittent Supervision/Assistance  Patient can return home with the following Two people to help with walking and/or transfers;Two people to help with bathing/dressing/bathroom;Help with stairs or ramp for entrance   Equipment Recommendations   (TBD)    Recommendations for Other Services       Precautions / Restrictions Precautions Precautions: Fall Restrictions Weight Bearing Restrictions: Yes RLE Weight Bearing: Weight bearing as tolerated     Mobility  Bed Mobility Overal bed mobility: Needs Assistance Bed Mobility: Supine to Sit, Sit to Supine     Supine to sit: Min assist Sit to supine: Min assist   General bed mobility comments: needs cues for sequencing. Once seated at EOB, upright posture noted.    Transfers Overall transfer level: Needs assistance Equipment used: Rolling walker (2 wheels) Transfers: Sit to/from Stand Sit to Stand: Max assist, From elevated surface           General transfer comment: needs assist for  positioning B LEs and hand placement. Heavy assist to stand. During 2nd attempt, mod +2 for standing.    Ambulation/Gait Ambulation/Gait assistance: Min assist Gait Distance (Feet): 10 Feet Assistive device: Rolling walker (2 wheels) Gait Pattern/deviations: Step-to pattern       General Gait Details: very slow step to gait pattern. Quick fatigue with +2 for chair follow. 1 LOB noted with mod assist to recovery.   Stairs             Wheelchair Mobility    Modified Rankin (Stroke Patients Only)       Balance Overall balance assessment: Needs assistance Sitting-balance support: Feet supported Sitting balance-Leahy Scale: Fair     Standing balance support: Bilateral upper extremity supported Standing balance-Leahy Scale: Poor                              Cognition Arousal/Alertness: Awake/alert Behavior During Therapy: WFL for tasks assessed/performed Overall Cognitive Status: Within Functional Limits for tasks assessed                                          Exercises Total Joint Exercises Goniometric ROM: R knee 0-90 degrees Other Exercises Other Exercises: Pt transferred to [facility] with mod assist. Needs assist for hygiene Other Exercises: supine ther-ex performed on R LE including QS, AP, SAQ, SLRs, and hip abd/add. 10 reps with safe technique    General Comments        Pertinent Vitals/Pain Pain Assessment Pain Assessment: 0-10 Pain Score: 7  Pain Location:  R knee Pain Descriptors / Indicators: Operative site guarding Pain Intervention(s): Limited activity within patient's tolerance, Premedicated before session, Ice applied    Home Living                          Prior Function            PT Goals (current goals can now be found in the care plan section) Acute Rehab PT Goals Patient Stated Goal: to go to rehab PT Goal Formulation: With patient Time For Goal Achievement: 09/25/22 Potential to Achieve  Goals: Fair Progress towards PT goals: Progressing toward goals    Frequency    BID      PT Plan Current plan remains appropriate    Co-evaluation              AM-PAC PT "6 Clicks" Mobility   Outcome Measure  Help needed turning from your back to your side while in a flat bed without using bedrails?: A Lot Help needed moving from lying on your back to sitting on the side of a flat bed without using bedrails?: A Lot Help needed moving to and from a bed to a chair (including a wheelchair)?: A Lot Help needed standing up from a chair using your arms (e.g., wheelchair or bedside chair)?: A Lot Help needed to walk in hospital room?: A Lot Help needed climbing 3-5 steps with a railing? : Total 6 Click Score: 11    End of Session Equipment Utilized During Treatment: Gait belt Activity Tolerance: Patient limited by pain Patient left: in bed;with SCD's reapplied Nurse Communication: Mobility status PT Visit Diagnosis: Muscle weakness (generalized) (M62.81);Difficulty in walking, not elsewhere classified (R26.2);Pain Pain - Right/Left: Right Pain - part of body: Knee     Time: 1761-6073 PT Time Calculation (min) (ACUTE ONLY): 34 min  Charges:  $Gait Training: 8-22 mins $Therapeutic Exercise: 8-22 mins                     Greggory Stallion, PT, DPT, GCS [phone]    Diana Green 09/12/2022, 11:06 AM

## 2022-09-13 DIAGNOSIS — I10 Essential (primary) hypertension: Secondary | ICD-10-CM | POA: Diagnosis not present

## 2022-09-13 DIAGNOSIS — E7849 Other hyperlipidemia: Secondary | ICD-10-CM | POA: Diagnosis not present

## 2022-09-13 DIAGNOSIS — M6281 Muscle weakness (generalized): Secondary | ICD-10-CM | POA: Diagnosis not present

## 2022-09-13 DIAGNOSIS — Z96651 Presence of right artificial knee joint: Secondary | ICD-10-CM | POA: Diagnosis not present

## 2022-09-13 DIAGNOSIS — M1990 Unspecified osteoarthritis, unspecified site: Secondary | ICD-10-CM | POA: Diagnosis not present

## 2022-09-15 DIAGNOSIS — I10 Essential (primary) hypertension: Secondary | ICD-10-CM | POA: Diagnosis not present

## 2022-09-15 DIAGNOSIS — R2689 Other abnormalities of gait and mobility: Secondary | ICD-10-CM | POA: Diagnosis not present

## 2022-09-15 DIAGNOSIS — M6281 Muscle weakness (generalized): Secondary | ICD-10-CM | POA: Diagnosis not present

## 2022-09-15 DIAGNOSIS — M1711 Unilateral primary osteoarthritis, right knee: Secondary | ICD-10-CM | POA: Diagnosis not present

## 2022-09-15 DIAGNOSIS — Z96651 Presence of right artificial knee joint: Secondary | ICD-10-CM | POA: Diagnosis not present

## 2022-09-15 DIAGNOSIS — M1990 Unspecified osteoarthritis, unspecified site: Secondary | ICD-10-CM | POA: Diagnosis not present

## 2022-09-15 DIAGNOSIS — E7849 Other hyperlipidemia: Secondary | ICD-10-CM | POA: Diagnosis not present

## 2022-09-15 DIAGNOSIS — F32A Depression, unspecified: Secondary | ICD-10-CM | POA: Diagnosis not present

## 2022-09-18 DIAGNOSIS — M1990 Unspecified osteoarthritis, unspecified site: Secondary | ICD-10-CM | POA: Diagnosis not present

## 2022-09-18 DIAGNOSIS — I10 Essential (primary) hypertension: Secondary | ICD-10-CM | POA: Diagnosis not present

## 2022-09-18 DIAGNOSIS — M6281 Muscle weakness (generalized): Secondary | ICD-10-CM | POA: Diagnosis not present

## 2022-09-18 DIAGNOSIS — E7849 Other hyperlipidemia: Secondary | ICD-10-CM | POA: Diagnosis not present

## 2022-09-18 DIAGNOSIS — Z96651 Presence of right artificial knee joint: Secondary | ICD-10-CM | POA: Diagnosis not present

## 2022-09-19 DIAGNOSIS — M1990 Unspecified osteoarthritis, unspecified site: Secondary | ICD-10-CM | POA: Diagnosis not present

## 2022-09-19 DIAGNOSIS — E7849 Other hyperlipidemia: Secondary | ICD-10-CM | POA: Diagnosis not present

## 2022-09-19 DIAGNOSIS — F32A Depression, unspecified: Secondary | ICD-10-CM | POA: Diagnosis not present

## 2022-09-19 DIAGNOSIS — M1711 Unilateral primary osteoarthritis, right knee: Secondary | ICD-10-CM | POA: Diagnosis not present

## 2022-09-19 DIAGNOSIS — M6281 Muscle weakness (generalized): Secondary | ICD-10-CM | POA: Diagnosis not present

## 2022-09-19 DIAGNOSIS — Z96651 Presence of right artificial knee joint: Secondary | ICD-10-CM | POA: Diagnosis not present

## 2022-09-19 DIAGNOSIS — R2689 Other abnormalities of gait and mobility: Secondary | ICD-10-CM | POA: Diagnosis not present

## 2022-09-19 DIAGNOSIS — I10 Essential (primary) hypertension: Secondary | ICD-10-CM | POA: Diagnosis not present

## 2022-09-20 DIAGNOSIS — M6281 Muscle weakness (generalized): Secondary | ICD-10-CM | POA: Diagnosis not present

## 2022-09-20 DIAGNOSIS — Z96651 Presence of right artificial knee joint: Secondary | ICD-10-CM | POA: Diagnosis not present

## 2022-09-20 DIAGNOSIS — M1990 Unspecified osteoarthritis, unspecified site: Secondary | ICD-10-CM | POA: Diagnosis not present

## 2022-09-20 DIAGNOSIS — E7849 Other hyperlipidemia: Secondary | ICD-10-CM | POA: Diagnosis not present

## 2022-09-20 DIAGNOSIS — I10 Essential (primary) hypertension: Secondary | ICD-10-CM | POA: Diagnosis not present

## 2022-09-22 DIAGNOSIS — I10 Essential (primary) hypertension: Secondary | ICD-10-CM | POA: Diagnosis not present

## 2022-09-22 DIAGNOSIS — M1990 Unspecified osteoarthritis, unspecified site: Secondary | ICD-10-CM | POA: Diagnosis not present

## 2022-09-22 DIAGNOSIS — F32A Depression, unspecified: Secondary | ICD-10-CM | POA: Diagnosis not present

## 2022-09-22 DIAGNOSIS — R2689 Other abnormalities of gait and mobility: Secondary | ICD-10-CM | POA: Diagnosis not present

## 2022-09-22 DIAGNOSIS — E7849 Other hyperlipidemia: Secondary | ICD-10-CM | POA: Diagnosis not present

## 2022-09-22 DIAGNOSIS — M1711 Unilateral primary osteoarthritis, right knee: Secondary | ICD-10-CM | POA: Diagnosis not present

## 2022-09-22 DIAGNOSIS — M6281 Muscle weakness (generalized): Secondary | ICD-10-CM | POA: Diagnosis not present

## 2022-09-22 DIAGNOSIS — Z96651 Presence of right artificial knee joint: Secondary | ICD-10-CM | POA: Diagnosis not present

## 2022-09-25 DIAGNOSIS — M79604 Pain in right leg: Secondary | ICD-10-CM | POA: Diagnosis not present

## 2022-09-25 DIAGNOSIS — M6281 Muscle weakness (generalized): Secondary | ICD-10-CM | POA: Diagnosis not present

## 2022-09-25 DIAGNOSIS — E7849 Other hyperlipidemia: Secondary | ICD-10-CM | POA: Diagnosis not present

## 2022-09-25 DIAGNOSIS — M1990 Unspecified osteoarthritis, unspecified site: Secondary | ICD-10-CM | POA: Diagnosis not present

## 2022-09-25 DIAGNOSIS — R0981 Nasal congestion: Secondary | ICD-10-CM | POA: Diagnosis not present

## 2022-09-25 DIAGNOSIS — R600 Localized edema: Secondary | ICD-10-CM | POA: Diagnosis not present

## 2022-09-25 DIAGNOSIS — I10 Essential (primary) hypertension: Secondary | ICD-10-CM | POA: Diagnosis not present

## 2022-09-25 DIAGNOSIS — Z96651 Presence of right artificial knee joint: Secondary | ICD-10-CM | POA: Diagnosis not present

## 2022-09-26 DIAGNOSIS — Z96651 Presence of right artificial knee joint: Secondary | ICD-10-CM | POA: Diagnosis not present

## 2022-09-26 DIAGNOSIS — R2689 Other abnormalities of gait and mobility: Secondary | ICD-10-CM | POA: Diagnosis not present

## 2022-09-26 DIAGNOSIS — M1711 Unilateral primary osteoarthritis, right knee: Secondary | ICD-10-CM | POA: Diagnosis not present

## 2022-09-26 DIAGNOSIS — M6281 Muscle weakness (generalized): Secondary | ICD-10-CM | POA: Diagnosis not present

## 2022-09-26 DIAGNOSIS — F32A Depression, unspecified: Secondary | ICD-10-CM | POA: Diagnosis not present

## 2022-09-29 ENCOUNTER — Other Ambulatory Visit: Payer: Self-pay | Admitting: Internal Medicine

## 2022-09-29 DIAGNOSIS — E7849 Other hyperlipidemia: Secondary | ICD-10-CM | POA: Diagnosis not present

## 2022-09-29 DIAGNOSIS — R0981 Nasal congestion: Secondary | ICD-10-CM | POA: Diagnosis not present

## 2022-09-29 DIAGNOSIS — I10 Essential (primary) hypertension: Secondary | ICD-10-CM | POA: Diagnosis not present

## 2022-09-29 DIAGNOSIS — M1990 Unspecified osteoarthritis, unspecified site: Secondary | ICD-10-CM | POA: Diagnosis not present

## 2022-09-29 DIAGNOSIS — M6281 Muscle weakness (generalized): Secondary | ICD-10-CM | POA: Diagnosis not present

## 2022-09-29 DIAGNOSIS — Z96651 Presence of right artificial knee joint: Secondary | ICD-10-CM | POA: Diagnosis not present

## 2022-10-04 DIAGNOSIS — Z96652 Presence of left artificial knee joint: Secondary | ICD-10-CM | POA: Diagnosis not present

## 2022-10-04 DIAGNOSIS — Z604 Social exclusion and rejection: Secondary | ICD-10-CM | POA: Diagnosis not present

## 2022-10-04 DIAGNOSIS — E46 Unspecified protein-calorie malnutrition: Secondary | ICD-10-CM | POA: Diagnosis not present

## 2022-10-04 DIAGNOSIS — M1990 Unspecified osteoarthritis, unspecified site: Secondary | ICD-10-CM | POA: Diagnosis not present

## 2022-10-04 DIAGNOSIS — K219 Gastro-esophageal reflux disease without esophagitis: Secondary | ICD-10-CM | POA: Diagnosis not present

## 2022-10-04 DIAGNOSIS — Z96651 Presence of right artificial knee joint: Secondary | ICD-10-CM | POA: Diagnosis not present

## 2022-10-04 DIAGNOSIS — G8929 Other chronic pain: Secondary | ICD-10-CM | POA: Diagnosis not present

## 2022-10-04 DIAGNOSIS — Z471 Aftercare following joint replacement surgery: Secondary | ICD-10-CM | POA: Diagnosis not present

## 2022-10-04 DIAGNOSIS — Z9049 Acquired absence of other specified parts of digestive tract: Secondary | ICD-10-CM | POA: Diagnosis not present

## 2022-10-04 DIAGNOSIS — R0981 Nasal congestion: Secondary | ICD-10-CM | POA: Diagnosis not present

## 2022-10-04 DIAGNOSIS — Z9181 History of falling: Secondary | ICD-10-CM | POA: Diagnosis not present

## 2022-10-04 DIAGNOSIS — F32A Depression, unspecified: Secondary | ICD-10-CM | POA: Diagnosis not present

## 2022-10-04 DIAGNOSIS — E785 Hyperlipidemia, unspecified: Secondary | ICD-10-CM | POA: Diagnosis not present

## 2022-10-04 DIAGNOSIS — Z86718 Personal history of other venous thrombosis and embolism: Secondary | ICD-10-CM | POA: Diagnosis not present

## 2022-10-04 DIAGNOSIS — I10 Essential (primary) hypertension: Secondary | ICD-10-CM | POA: Diagnosis not present

## 2022-10-05 DIAGNOSIS — Z604 Social exclusion and rejection: Secondary | ICD-10-CM | POA: Diagnosis not present

## 2022-10-05 DIAGNOSIS — K219 Gastro-esophageal reflux disease without esophagitis: Secondary | ICD-10-CM | POA: Diagnosis not present

## 2022-10-05 DIAGNOSIS — Z9181 History of falling: Secondary | ICD-10-CM | POA: Diagnosis not present

## 2022-10-05 DIAGNOSIS — E46 Unspecified protein-calorie malnutrition: Secondary | ICD-10-CM | POA: Diagnosis not present

## 2022-10-05 DIAGNOSIS — E785 Hyperlipidemia, unspecified: Secondary | ICD-10-CM | POA: Diagnosis not present

## 2022-10-05 DIAGNOSIS — F32A Depression, unspecified: Secondary | ICD-10-CM | POA: Diagnosis not present

## 2022-10-05 DIAGNOSIS — Z96651 Presence of right artificial knee joint: Secondary | ICD-10-CM | POA: Diagnosis not present

## 2022-10-05 DIAGNOSIS — M1711 Unilateral primary osteoarthritis, right knee: Secondary | ICD-10-CM | POA: Diagnosis not present

## 2022-10-05 DIAGNOSIS — Z9049 Acquired absence of other specified parts of digestive tract: Secondary | ICD-10-CM | POA: Diagnosis not present

## 2022-10-05 DIAGNOSIS — Z96652 Presence of left artificial knee joint: Secondary | ICD-10-CM | POA: Diagnosis not present

## 2022-10-05 DIAGNOSIS — I10 Essential (primary) hypertension: Secondary | ICD-10-CM | POA: Diagnosis not present

## 2022-10-05 DIAGNOSIS — M1990 Unspecified osteoarthritis, unspecified site: Secondary | ICD-10-CM | POA: Diagnosis not present

## 2022-10-05 DIAGNOSIS — Z471 Aftercare following joint replacement surgery: Secondary | ICD-10-CM | POA: Diagnosis not present

## 2022-10-05 DIAGNOSIS — G8929 Other chronic pain: Secondary | ICD-10-CM | POA: Diagnosis not present

## 2022-10-05 DIAGNOSIS — R0981 Nasal congestion: Secondary | ICD-10-CM | POA: Diagnosis not present

## 2022-10-05 DIAGNOSIS — Z86718 Personal history of other venous thrombosis and embolism: Secondary | ICD-10-CM | POA: Diagnosis not present

## 2022-10-06 DIAGNOSIS — Z9181 History of falling: Secondary | ICD-10-CM | POA: Diagnosis not present

## 2022-10-06 DIAGNOSIS — Z96652 Presence of left artificial knee joint: Secondary | ICD-10-CM | POA: Diagnosis not present

## 2022-10-06 DIAGNOSIS — E785 Hyperlipidemia, unspecified: Secondary | ICD-10-CM | POA: Diagnosis not present

## 2022-10-06 DIAGNOSIS — Z471 Aftercare following joint replacement surgery: Secondary | ICD-10-CM | POA: Diagnosis not present

## 2022-10-06 DIAGNOSIS — F32A Depression, unspecified: Secondary | ICD-10-CM | POA: Diagnosis not present

## 2022-10-06 DIAGNOSIS — I10 Essential (primary) hypertension: Secondary | ICD-10-CM | POA: Diagnosis not present

## 2022-10-06 DIAGNOSIS — Z96651 Presence of right artificial knee joint: Secondary | ICD-10-CM | POA: Diagnosis not present

## 2022-10-06 DIAGNOSIS — K219 Gastro-esophageal reflux disease without esophagitis: Secondary | ICD-10-CM | POA: Diagnosis not present

## 2022-10-06 DIAGNOSIS — Z9049 Acquired absence of other specified parts of digestive tract: Secondary | ICD-10-CM | POA: Diagnosis not present

## 2022-10-06 DIAGNOSIS — Z86718 Personal history of other venous thrombosis and embolism: Secondary | ICD-10-CM | POA: Diagnosis not present

## 2022-10-06 DIAGNOSIS — Z604 Social exclusion and rejection: Secondary | ICD-10-CM | POA: Diagnosis not present

## 2022-10-06 DIAGNOSIS — G8929 Other chronic pain: Secondary | ICD-10-CM | POA: Diagnosis not present

## 2022-10-06 DIAGNOSIS — E46 Unspecified protein-calorie malnutrition: Secondary | ICD-10-CM | POA: Diagnosis not present

## 2022-10-06 DIAGNOSIS — M1990 Unspecified osteoarthritis, unspecified site: Secondary | ICD-10-CM | POA: Diagnosis not present

## 2022-10-06 DIAGNOSIS — R0981 Nasal congestion: Secondary | ICD-10-CM | POA: Diagnosis not present

## 2022-10-09 ENCOUNTER — Encounter: Payer: Self-pay | Admitting: Internal Medicine

## 2022-10-09 ENCOUNTER — Ambulatory Visit (INDEPENDENT_AMBULATORY_CARE_PROVIDER_SITE_OTHER): Payer: Medicare Other | Admitting: Internal Medicine

## 2022-10-09 VITALS — BP 132/74 | HR 71 | Ht 69.0 in | Wt 268.0 lb

## 2022-10-09 DIAGNOSIS — D509 Iron deficiency anemia, unspecified: Secondary | ICD-10-CM

## 2022-10-09 DIAGNOSIS — R7302 Impaired glucose tolerance (oral): Secondary | ICD-10-CM

## 2022-10-09 DIAGNOSIS — E782 Mixed hyperlipidemia: Secondary | ICD-10-CM | POA: Diagnosis not present

## 2022-10-09 DIAGNOSIS — D508 Other iron deficiency anemias: Secondary | ICD-10-CM

## 2022-10-09 DIAGNOSIS — I10 Essential (primary) hypertension: Secondary | ICD-10-CM

## 2022-10-09 DIAGNOSIS — E559 Vitamin D deficiency, unspecified: Secondary | ICD-10-CM | POA: Diagnosis not present

## 2022-10-09 HISTORY — DX: Impaired glucose tolerance (oral): R73.02

## 2022-10-09 HISTORY — DX: Vitamin D deficiency, unspecified: E55.9

## 2022-10-09 HISTORY — DX: Essential (primary) hypertension: I10

## 2022-10-09 HISTORY — DX: Iron deficiency anemia, unspecified: D50.9

## 2022-10-09 NOTE — Progress Notes (Signed)
Established Patient Office Visit  Subjective:  Patient ID: Diana Green, female    DOB: Jan 20, 1951  Age: 72 y.o. MRN: [id number]  Chief Complaint  Patient presents with   Follow-up    Follow up    Patient comes in for her follow-up today.  She just had a right total knee replacement.  There is still some swelling but it is not even been a month yet.  She actually feels better except for some pain in the right knee.  During her hospital stay her blood glucose was found to be high as well as low H&H.  We will repeat it today.  Meanwhile she has been advised to continue on her medications.  Patient is also getting physical therapy at home for her right knee.   Allergies as of 10/09/2022   No Known Allergies      Medication List        Accurate as of October 09, 2022  2:33 PM. If you have any questions, ask your nurse or doctor.          acetaminophen 500 MG tablet Commonly known as: TYLENOL Take 1,000 mg by mouth 3 (three) times daily.   atenolol 100 MG tablet Commonly known as: TENORMIN Take 100 mg by mouth in the morning.   DULoxetine 60 MG capsule Commonly known as: CYMBALTA Take 60 mg by mouth in the morning.   enoxaparin 40 MG/0.4ML injection Commonly known as: LOVENOX Inject 0.4 mLs (40 mg total) into the skin daily for 14 days.   gabapentin 600 MG tablet Commonly known as: NEURONTIN Take 600 mg by mouth 2 (two) times daily.   hydrochlorothiazide 25 MG tablet Commonly known as: HYDRODIURIL Take 25 mg by mouth in the morning.   lidocaine 4 % Place 1 patch onto the skin daily as needed (back pain).   losartan 100 MG tablet Commonly known as: COZAAR Take 100 mg by mouth at bedtime.   methocarbamol 500 MG tablet Commonly known as: ROBAXIN Take 1 tablet (500 mg total) by mouth every 6 (six) hours as needed for muscle spasms.   senna 8.6 MG Tabs tablet Commonly known as: SENOKOT Take 1 tablet (8.6 mg total) by mouth 2 (two) times daily. What changed:  when to take this   simvastatin 20 MG tablet Commonly known as: ZOCOR Take 20 mg by mouth at bedtime.   tiZANidine 4 MG tablet Commonly known as: ZANAFLEX Take 1 tablet by mouth twice daily   traMADol 50 MG tablet Commonly known as: ULTRAM Take 1-2 tablets (50-100 mg total) by mouth every 6 (six) hours as needed for moderate pain.   Vitamin D (Ergocalciferol) 1.25 MG (50000 UNIT) Caps capsule Commonly known as: DRISDOL Take 50,000 Units by mouth every 7 (seven) days.         Past Medical History:  Diagnosis Date   Arthritis    Depression    Essential hypertension, benign 10/09/2022   GERD (gastroesophageal reflux disease)    occ no meds   High cholesterol    Hypertension    Impaired glucose tolerance 10/09/2022   Iron deficiency anemia 10/09/2022   Vitamin D deficiency 10/09/2022   Past Surgical History:  Procedure Laterality Date   ANTERIOR CERVICAL DECOMP/DISCECTOMY FUSION N/A 07/08/2021   Procedure: C3-4 ANTERIOR CERVICAL DECOMPRESSION/DISCECTOMY FUSION 1 LEVEL WITH HEDRON;  Surgeon: Meade Maw, MD;  Location: [facility] ORS;  Service: Neurosurgery;  Laterality: N/A;   APPLICATION OF INTRAOPERATIVE CT SCAN N/A 09/19/2021   Procedure: APPLICATION  OF INTRAOPERATIVE CT SCAN;  Surgeon: Meade Maw, MD;  Location: [facility] OR[facility];  Service: Neurosurgery;  Laterality: N/A;   APPLICATION OF WOUND VAC  09/19/2021   Procedure: APPLICATION OF WOUND VAC;  Surgeon: Meade Maw, MD;  Location: [facility] OR[facility];  Service: Neurosurgery;;  [place]     thoracic   [place] Right 2006   TOTAL KNEE ARTHROPLASTY Left 2014   TOTAL KNEE ARTHROPLASTY Right 09/11/2022   Procedure: TOTAL KNEE ARTHROPLASTY;  Surgeon: Steffanie Rainwater, MD;  Location: [facility] ORS;  Service: Orthopedics;  Laterality: Right;   TUBAL LIGATION  1975    Social History   Socioeconomic History   Marital status: Married     Spouse name: Not on file   Number of children: Not on file   Years of education: Not on file   Highest education level: Not on file  Occupational History   Not on file  Tobacco Use   Smoking status: Never   Smokeless tobacco: Never  Vaping Use   Vaping Use: Never used  Substance and Sexual Activity   Alcohol use: Yes    Comment: rarely   Drug use: No   Sexual activity: Not on file  Other Topics Concern   Not on file  Social History Narrative   Not on file   Social Determinants of Health   Financial Resource Strain: Not on file  Food Insecurity: No Food Insecurity (09/11/2022)   Hunger Vital Sign    Worried About Running Out of Food in the Last Year: Never true    Ran Out of Food in the Last Year: Never true  Transportation Needs: No Transportation Needs (09/11/2022)   PRAPARE - Hydrologist (Medical): No    Lack of Transportation (Non-Medical): No  Physical Activity: Not on file  Stress: Not on file  Social Connections: Not on file  Intimate Partner Violence: Not At Risk (09/11/2022)   Humiliation, Afraid, Rape, and Kick questionnaire    Fear of Current or Ex-Partner: No    Emotionally Abused: No    Physically Abused: No    Sexually Abused: No    History reviewed. No pertinent family history.  No Known Allergies  Review of Systems  Constitutional: Negative.   HENT: Negative.    Eyes: Negative.   Respiratory: Negative.    Cardiovascular: Negative.   Gastrointestinal: Negative.   Genitourinary: Negative.   Musculoskeletal:  Positive for joint pain and myalgias. Negative for falls and neck pain.  Skin: Negative.   Psychiatric/Behavioral:  Positive for depression. Negative for memory loss. The patient is not nervous/anxious and does not have insomnia.        Objective:   BP 132/74   Pulse 71   Ht 5' 9"$  (1.753 m)   Wt 268 lb (121.6 kg)   SpO2 97%   BMI 39.58 kg/m   Vitals:   10/09/22 1118  BP: 132/74  Pulse: 71  Height:  5' 9"$  (1.753 m)  Weight: 268 lb (121.6 kg)  SpO2: 97%  BMI (Calculated): 39.56    Physical Exam Vitals and nursing note reviewed.  Constitutional:      Appearance: Normal appearance. She is obese.  Cardiovascular:     Rate and Rhythm: Normal rate and regular rhythm.     Pulses: Normal pulses.  Pulmonary:     Effort: Pulmonary effort is normal.  Breath sounds: Normal breath sounds.  Abdominal:     General: Abdomen is flat. Bowel sounds are normal.     Palpations: Abdomen is soft.  Musculoskeletal:        General: No swelling.     Cervical back: Normal range of motion and neck supple.     Right lower leg: Edema present.  Skin:    General: Skin is warm.  Neurological:     General: No focal deficit present.     Mental Status: She is alert.  Psychiatric:        Mood and Affect: Mood normal.        Behavior: Behavior normal.      No results found for any visits on 10/09/22.      Assessment & Plan:  Patient advised to continue her current medications as such.  Labs today.  Patient will also continue her physical therapy and rehab for her right knee. Problem List Items Addressed This Visit     Mixed hyperlipidemia   Relevant Orders   Lipid Panel w/o Chol/HDL Ratio   Essential hypertension, benign   Relevant Orders   CMP14+EGFR   Vitamin D deficiency - Primary   Relevant Orders   VITAMIN D 25 Hydroxy (Vit-D Deficiency, Fractures)   Impaired glucose tolerance   Relevant Orders   Hemoglobin A1c   Iron deficiency anemia   Relevant Orders   CBC With Differential    Return in about 3 months (around 01/07/2023).   Total time spent: 30 minutes  Perrin Maltese, MD  10/09/2022

## 2022-10-10 DIAGNOSIS — K219 Gastro-esophageal reflux disease without esophagitis: Secondary | ICD-10-CM | POA: Diagnosis not present

## 2022-10-10 DIAGNOSIS — Z96652 Presence of left artificial knee joint: Secondary | ICD-10-CM | POA: Diagnosis not present

## 2022-10-10 DIAGNOSIS — Z96651 Presence of right artificial knee joint: Secondary | ICD-10-CM | POA: Diagnosis not present

## 2022-10-10 DIAGNOSIS — Z9049 Acquired absence of other specified parts of digestive tract: Secondary | ICD-10-CM | POA: Diagnosis not present

## 2022-10-10 DIAGNOSIS — Z9181 History of falling: Secondary | ICD-10-CM | POA: Diagnosis not present

## 2022-10-10 DIAGNOSIS — R0981 Nasal congestion: Secondary | ICD-10-CM | POA: Diagnosis not present

## 2022-10-10 DIAGNOSIS — I10 Essential (primary) hypertension: Secondary | ICD-10-CM | POA: Diagnosis not present

## 2022-10-10 DIAGNOSIS — Z604 Social exclusion and rejection: Secondary | ICD-10-CM | POA: Diagnosis not present

## 2022-10-10 DIAGNOSIS — F32A Depression, unspecified: Secondary | ICD-10-CM | POA: Diagnosis not present

## 2022-10-10 DIAGNOSIS — G8929 Other chronic pain: Secondary | ICD-10-CM | POA: Diagnosis not present

## 2022-10-10 DIAGNOSIS — Z471 Aftercare following joint replacement surgery: Secondary | ICD-10-CM | POA: Diagnosis not present

## 2022-10-10 DIAGNOSIS — Z86718 Personal history of other venous thrombosis and embolism: Secondary | ICD-10-CM | POA: Diagnosis not present

## 2022-10-10 DIAGNOSIS — E46 Unspecified protein-calorie malnutrition: Secondary | ICD-10-CM | POA: Diagnosis not present

## 2022-10-10 DIAGNOSIS — M1990 Unspecified osteoarthritis, unspecified site: Secondary | ICD-10-CM | POA: Diagnosis not present

## 2022-10-10 DIAGNOSIS — E785 Hyperlipidemia, unspecified: Secondary | ICD-10-CM | POA: Diagnosis not present

## 2022-10-10 LAB — CMP14+EGFR
ALT: 11 IU/L (ref 0–32)
AST: 13 IU/L (ref 0–40)
Albumin/Globulin Ratio: 1.6 (ref 1.2–2.2)
Albumin: 4.4 g/dL (ref 3.8–4.8)
Alkaline Phosphatase: 89 IU/L (ref 44–121)
BUN/Creatinine Ratio: 13 (ref 12–28)
BUN: 15 mg/dL (ref 8–27)
Bilirubin Total: 0.2 mg/dL (ref 0.0–1.2)
CO2: 25 mmol/L (ref 20–29)
Calcium: 10.7 mg/dL — ABNORMAL HIGH (ref 8.7–10.3)
Chloride: 100 mmol/L (ref 96–106)
Creatinine, Ser: 1.18 mg/dL — ABNORMAL HIGH (ref 0.57–1.00)
Globulin, Total: 2.8 g/dL (ref 1.5–4.5)
Glucose: 101 mg/dL — ABNORMAL HIGH (ref 70–99)
Potassium: 4.4 mmol/L (ref 3.5–5.2)
Sodium: 140 mmol/L (ref 134–144)
Total Protein: 7.2 g/dL (ref 6.0–8.5)
eGFR: 49 mL/min/{1.73_m2} — ABNORMAL LOW (ref >59)

## 2022-10-10 LAB — CBC WITH DIFFERENTIAL
Basophils Absolute: 0 10*3/uL (ref 0.0–0.2)
Basos: 1 %
EOS (ABSOLUTE): 0.3 10*3/uL (ref 0.0–0.4)
Eos: 6 %
Hematocrit: 31 % — ABNORMAL LOW (ref 34.0–46.6)
Hemoglobin: 10 g/dL — ABNORMAL LOW (ref 11.1–15.9)
Immature Grans (Abs): 0 10*3/uL (ref 0.0–0.1)
Immature Granulocytes: 0 %
Lymphocytes Absolute: 1.7 10*3/uL (ref 0.7–3.1)
Lymphs: 28 %
MCH: 29 pg (ref 26.6–33.0)
MCHC: 32.3 g/dL (ref 31.5–35.7)
MCV: 90 fL (ref 79–97)
Monocytes Absolute: 0.6 10*3/uL (ref 0.1–0.9)
Monocytes: 9 %
Neutrophils Absolute: 3.3 10*3/uL (ref 1.4–7.0)
Neutrophils: 56 %
RBC: 3.45 x10E6/uL — ABNORMAL LOW (ref 3.77–5.28)
RDW: 12.8 % (ref 11.7–15.4)
WBC: 5.9 10*3/uL (ref 3.4–10.8)

## 2022-10-10 LAB — LIPID PANEL W/O CHOL/HDL RATIO
Cholesterol, Total: 154 mg/dL (ref 100–199)
HDL: 48 mg/dL (ref >39)
LDL Chol Calc (NIH): 72 mg/dL (ref 0–99)
Triglycerides: 203 mg/dL — ABNORMAL HIGH (ref 0–149)
VLDL Cholesterol Cal: 34 mg/dL (ref 5–40)

## 2022-10-10 LAB — VITAMIN D 25 HYDROXY (VIT D DEFICIENCY, FRACTURES): Vit D, 25-Hydroxy: 84.3 ng/mL (ref 30.0–100.0)

## 2022-10-10 LAB — HEMOGLOBIN A1C
Est. average glucose Bld gHb Est-mCnc: 131 mg/dL
Hgb A1c MFr Bld: 6.2 % — ABNORMAL HIGH (ref 4.8–5.6)

## 2022-10-11 DIAGNOSIS — Z9049 Acquired absence of other specified parts of digestive tract: Secondary | ICD-10-CM | POA: Diagnosis not present

## 2022-10-11 DIAGNOSIS — Z96651 Presence of right artificial knee joint: Secondary | ICD-10-CM | POA: Diagnosis not present

## 2022-10-11 DIAGNOSIS — K219 Gastro-esophageal reflux disease without esophagitis: Secondary | ICD-10-CM | POA: Diagnosis not present

## 2022-10-11 DIAGNOSIS — Z604 Social exclusion and rejection: Secondary | ICD-10-CM | POA: Diagnosis not present

## 2022-10-11 DIAGNOSIS — F32A Depression, unspecified: Secondary | ICD-10-CM | POA: Diagnosis not present

## 2022-10-11 DIAGNOSIS — G8929 Other chronic pain: Secondary | ICD-10-CM | POA: Diagnosis not present

## 2022-10-11 DIAGNOSIS — Z9181 History of falling: Secondary | ICD-10-CM | POA: Diagnosis not present

## 2022-10-11 DIAGNOSIS — M1990 Unspecified osteoarthritis, unspecified site: Secondary | ICD-10-CM | POA: Diagnosis not present

## 2022-10-11 DIAGNOSIS — Z96652 Presence of left artificial knee joint: Secondary | ICD-10-CM | POA: Diagnosis not present

## 2022-10-11 DIAGNOSIS — E46 Unspecified protein-calorie malnutrition: Secondary | ICD-10-CM | POA: Diagnosis not present

## 2022-10-11 DIAGNOSIS — Z86718 Personal history of other venous thrombosis and embolism: Secondary | ICD-10-CM | POA: Diagnosis not present

## 2022-10-11 DIAGNOSIS — R0981 Nasal congestion: Secondary | ICD-10-CM | POA: Diagnosis not present

## 2022-10-11 DIAGNOSIS — E785 Hyperlipidemia, unspecified: Secondary | ICD-10-CM | POA: Diagnosis not present

## 2022-10-11 DIAGNOSIS — Z471 Aftercare following joint replacement surgery: Secondary | ICD-10-CM | POA: Diagnosis not present

## 2022-10-11 DIAGNOSIS — I10 Essential (primary) hypertension: Secondary | ICD-10-CM | POA: Diagnosis not present

## 2022-10-13 ENCOUNTER — Other Ambulatory Visit: Payer: Self-pay | Admitting: Internal Medicine

## 2022-10-13 DIAGNOSIS — Z9181 History of falling: Secondary | ICD-10-CM | POA: Diagnosis not present

## 2022-10-13 DIAGNOSIS — K219 Gastro-esophageal reflux disease without esophagitis: Secondary | ICD-10-CM | POA: Diagnosis not present

## 2022-10-13 DIAGNOSIS — Z96651 Presence of right artificial knee joint: Secondary | ICD-10-CM | POA: Diagnosis not present

## 2022-10-13 DIAGNOSIS — Z604 Social exclusion and rejection: Secondary | ICD-10-CM | POA: Diagnosis not present

## 2022-10-13 DIAGNOSIS — I10 Essential (primary) hypertension: Secondary | ICD-10-CM | POA: Diagnosis not present

## 2022-10-13 DIAGNOSIS — G8929 Other chronic pain: Secondary | ICD-10-CM | POA: Diagnosis not present

## 2022-10-13 DIAGNOSIS — F32A Depression, unspecified: Secondary | ICD-10-CM | POA: Diagnosis not present

## 2022-10-13 DIAGNOSIS — Z9049 Acquired absence of other specified parts of digestive tract: Secondary | ICD-10-CM | POA: Diagnosis not present

## 2022-10-13 DIAGNOSIS — Z96652 Presence of left artificial knee joint: Secondary | ICD-10-CM | POA: Diagnosis not present

## 2022-10-13 DIAGNOSIS — R0981 Nasal congestion: Secondary | ICD-10-CM | POA: Diagnosis not present

## 2022-10-13 DIAGNOSIS — M1990 Unspecified osteoarthritis, unspecified site: Secondary | ICD-10-CM | POA: Diagnosis not present

## 2022-10-13 DIAGNOSIS — Z471 Aftercare following joint replacement surgery: Secondary | ICD-10-CM | POA: Diagnosis not present

## 2022-10-13 DIAGNOSIS — E785 Hyperlipidemia, unspecified: Secondary | ICD-10-CM | POA: Diagnosis not present

## 2022-10-13 DIAGNOSIS — E46 Unspecified protein-calorie malnutrition: Secondary | ICD-10-CM | POA: Diagnosis not present

## 2022-10-13 DIAGNOSIS — Z86718 Personal history of other venous thrombosis and embolism: Secondary | ICD-10-CM | POA: Diagnosis not present

## 2022-10-13 DIAGNOSIS — D508 Other iron deficiency anemias: Secondary | ICD-10-CM

## 2022-10-13 MED ORDER — DULOXETINE HCL 60 MG PO CPEP: 60.0000 mg | ORAL_CAPSULE | Freq: Every morning | ORAL | 3 refills | Status: DC

## 2022-10-13 MED ORDER — LOSARTAN POTASSIUM 100 MG PO TABS: 100.0000 mg | ORAL_TABLET | Freq: Every evening | ORAL | 3 refills | Status: DC

## 2022-10-13 MED ORDER — GABAPENTIN 600 MG PO TABS: 600.0000 mg | ORAL_TABLET | Freq: Two times a day (BID) | ORAL | 3 refills | Status: DC

## 2022-10-13 MED ORDER — HYDROCHLOROTHIAZIDE 25 MG PO TABS: 25.0000 mg | ORAL_TABLET | Freq: Every morning | ORAL | 3 refills | Status: DC

## 2022-10-13 MED ORDER — HEMOCYTE-PLUS 106-1 MG PO TABS: 1.0000 | ORAL_TABLET | Freq: Every day | ORAL | 3 refills | Status: DC

## 2022-10-13 MED ORDER — SIMVASTATIN 20 MG PO TABS: 20.0000 mg | ORAL_TABLET | Freq: Every day | ORAL | 3 refills | Status: DC

## 2022-10-13 MED ORDER — ATENOLOL 100 MG PO TABS: 100.0000 mg | ORAL_TABLET | Freq: Every morning | ORAL | 3 refills | Status: DC

## 2022-10-16 DIAGNOSIS — Z9049 Acquired absence of other specified parts of digestive tract: Secondary | ICD-10-CM | POA: Diagnosis not present

## 2022-10-16 DIAGNOSIS — Z604 Social exclusion and rejection: Secondary | ICD-10-CM | POA: Diagnosis not present

## 2022-10-16 DIAGNOSIS — E785 Hyperlipidemia, unspecified: Secondary | ICD-10-CM | POA: Diagnosis not present

## 2022-10-16 DIAGNOSIS — M1990 Unspecified osteoarthritis, unspecified site: Secondary | ICD-10-CM | POA: Diagnosis not present

## 2022-10-16 DIAGNOSIS — Z471 Aftercare following joint replacement surgery: Secondary | ICD-10-CM | POA: Diagnosis not present

## 2022-10-16 DIAGNOSIS — Z96651 Presence of right artificial knee joint: Secondary | ICD-10-CM | POA: Diagnosis not present

## 2022-10-16 DIAGNOSIS — R0981 Nasal congestion: Secondary | ICD-10-CM | POA: Diagnosis not present

## 2022-10-16 DIAGNOSIS — I10 Essential (primary) hypertension: Secondary | ICD-10-CM | POA: Diagnosis not present

## 2022-10-16 DIAGNOSIS — K219 Gastro-esophageal reflux disease without esophagitis: Secondary | ICD-10-CM | POA: Diagnosis not present

## 2022-10-16 DIAGNOSIS — Z96652 Presence of left artificial knee joint: Secondary | ICD-10-CM | POA: Diagnosis not present

## 2022-10-16 DIAGNOSIS — G8929 Other chronic pain: Secondary | ICD-10-CM | POA: Diagnosis not present

## 2022-10-16 DIAGNOSIS — E46 Unspecified protein-calorie malnutrition: Secondary | ICD-10-CM | POA: Diagnosis not present

## 2022-10-16 DIAGNOSIS — Z9181 History of falling: Secondary | ICD-10-CM | POA: Diagnosis not present

## 2022-10-16 DIAGNOSIS — F32A Depression, unspecified: Secondary | ICD-10-CM | POA: Diagnosis not present

## 2022-10-16 DIAGNOSIS — Z86718 Personal history of other venous thrombosis and embolism: Secondary | ICD-10-CM | POA: Diagnosis not present

## 2022-10-17 DIAGNOSIS — Z96651 Presence of right artificial knee joint: Secondary | ICD-10-CM | POA: Diagnosis not present

## 2022-10-17 DIAGNOSIS — Z9181 History of falling: Secondary | ICD-10-CM | POA: Diagnosis not present

## 2022-10-17 DIAGNOSIS — E785 Hyperlipidemia, unspecified: Secondary | ICD-10-CM | POA: Diagnosis not present

## 2022-10-17 DIAGNOSIS — K219 Gastro-esophageal reflux disease without esophagitis: Secondary | ICD-10-CM | POA: Diagnosis not present

## 2022-10-17 DIAGNOSIS — F32A Depression, unspecified: Secondary | ICD-10-CM | POA: Diagnosis not present

## 2022-10-17 DIAGNOSIS — I10 Essential (primary) hypertension: Secondary | ICD-10-CM | POA: Diagnosis not present

## 2022-10-17 DIAGNOSIS — G8929 Other chronic pain: Secondary | ICD-10-CM | POA: Diagnosis not present

## 2022-10-17 DIAGNOSIS — Z604 Social exclusion and rejection: Secondary | ICD-10-CM | POA: Diagnosis not present

## 2022-10-17 DIAGNOSIS — Z86718 Personal history of other venous thrombosis and embolism: Secondary | ICD-10-CM | POA: Diagnosis not present

## 2022-10-17 DIAGNOSIS — M1990 Unspecified osteoarthritis, unspecified site: Secondary | ICD-10-CM | POA: Diagnosis not present

## 2022-10-17 DIAGNOSIS — Z96652 Presence of left artificial knee joint: Secondary | ICD-10-CM | POA: Diagnosis not present

## 2022-10-17 DIAGNOSIS — E46 Unspecified protein-calorie malnutrition: Secondary | ICD-10-CM | POA: Diagnosis not present

## 2022-10-17 DIAGNOSIS — R0981 Nasal congestion: Secondary | ICD-10-CM | POA: Diagnosis not present

## 2022-10-17 DIAGNOSIS — Z9049 Acquired absence of other specified parts of digestive tract: Secondary | ICD-10-CM | POA: Diagnosis not present

## 2022-10-17 DIAGNOSIS — Z471 Aftercare following joint replacement surgery: Secondary | ICD-10-CM | POA: Diagnosis not present

## 2022-10-18 DIAGNOSIS — G8929 Other chronic pain: Secondary | ICD-10-CM | POA: Diagnosis not present

## 2022-10-18 DIAGNOSIS — M19012 Primary osteoarthritis, left shoulder: Secondary | ICD-10-CM | POA: Diagnosis not present

## 2022-10-18 DIAGNOSIS — Z96651 Presence of right artificial knee joint: Secondary | ICD-10-CM | POA: Diagnosis not present

## 2022-10-19 DIAGNOSIS — K219 Gastro-esophageal reflux disease without esophagitis: Secondary | ICD-10-CM | POA: Diagnosis not present

## 2022-10-19 DIAGNOSIS — Z604 Social exclusion and rejection: Secondary | ICD-10-CM | POA: Diagnosis not present

## 2022-10-19 DIAGNOSIS — Z471 Aftercare following joint replacement surgery: Secondary | ICD-10-CM | POA: Diagnosis not present

## 2022-10-19 DIAGNOSIS — F32A Depression, unspecified: Secondary | ICD-10-CM | POA: Diagnosis not present

## 2022-10-19 DIAGNOSIS — R0981 Nasal congestion: Secondary | ICD-10-CM | POA: Diagnosis not present

## 2022-10-19 DIAGNOSIS — M1990 Unspecified osteoarthritis, unspecified site: Secondary | ICD-10-CM | POA: Diagnosis not present

## 2022-10-19 DIAGNOSIS — Z86718 Personal history of other venous thrombosis and embolism: Secondary | ICD-10-CM | POA: Diagnosis not present

## 2022-10-19 DIAGNOSIS — Z9049 Acquired absence of other specified parts of digestive tract: Secondary | ICD-10-CM | POA: Diagnosis not present

## 2022-10-19 DIAGNOSIS — Z96652 Presence of left artificial knee joint: Secondary | ICD-10-CM | POA: Diagnosis not present

## 2022-10-19 DIAGNOSIS — I10 Essential (primary) hypertension: Secondary | ICD-10-CM | POA: Diagnosis not present

## 2022-10-19 DIAGNOSIS — G8929 Other chronic pain: Secondary | ICD-10-CM | POA: Diagnosis not present

## 2022-10-19 DIAGNOSIS — E46 Unspecified protein-calorie malnutrition: Secondary | ICD-10-CM | POA: Diagnosis not present

## 2022-10-19 DIAGNOSIS — Z96651 Presence of right artificial knee joint: Secondary | ICD-10-CM | POA: Diagnosis not present

## 2022-10-19 DIAGNOSIS — E785 Hyperlipidemia, unspecified: Secondary | ICD-10-CM | POA: Diagnosis not present

## 2022-10-19 DIAGNOSIS — Z9181 History of falling: Secondary | ICD-10-CM | POA: Diagnosis not present

## 2022-10-20 ENCOUNTER — Telehealth: Payer: Self-pay

## 2022-10-20 NOTE — Telephone Encounter (Signed)
Pt called and left vm regarding if we can send all rx refills to her mail order pharmacy, please advise

## 2022-10-23 ENCOUNTER — Other Ambulatory Visit: Payer: Self-pay

## 2022-10-23 DIAGNOSIS — Z604 Social exclusion and rejection: Secondary | ICD-10-CM | POA: Diagnosis not present

## 2022-10-23 DIAGNOSIS — G8929 Other chronic pain: Secondary | ICD-10-CM | POA: Diagnosis not present

## 2022-10-23 DIAGNOSIS — Z471 Aftercare following joint replacement surgery: Secondary | ICD-10-CM | POA: Diagnosis not present

## 2022-10-23 DIAGNOSIS — I10 Essential (primary) hypertension: Secondary | ICD-10-CM | POA: Diagnosis not present

## 2022-10-23 DIAGNOSIS — Z96651 Presence of right artificial knee joint: Secondary | ICD-10-CM | POA: Diagnosis not present

## 2022-10-23 DIAGNOSIS — Z9181 History of falling: Secondary | ICD-10-CM | POA: Diagnosis not present

## 2022-10-23 DIAGNOSIS — Z86718 Personal history of other venous thrombosis and embolism: Secondary | ICD-10-CM | POA: Diagnosis not present

## 2022-10-23 DIAGNOSIS — M1990 Unspecified osteoarthritis, unspecified site: Secondary | ICD-10-CM | POA: Diagnosis not present

## 2022-10-23 DIAGNOSIS — E46 Unspecified protein-calorie malnutrition: Secondary | ICD-10-CM | POA: Diagnosis not present

## 2022-10-23 DIAGNOSIS — R0981 Nasal congestion: Secondary | ICD-10-CM | POA: Diagnosis not present

## 2022-10-23 DIAGNOSIS — Z96652 Presence of left artificial knee joint: Secondary | ICD-10-CM | POA: Diagnosis not present

## 2022-10-23 DIAGNOSIS — E785 Hyperlipidemia, unspecified: Secondary | ICD-10-CM | POA: Diagnosis not present

## 2022-10-23 DIAGNOSIS — Z9049 Acquired absence of other specified parts of digestive tract: Secondary | ICD-10-CM | POA: Diagnosis not present

## 2022-10-23 DIAGNOSIS — F32A Depression, unspecified: Secondary | ICD-10-CM | POA: Diagnosis not present

## 2022-10-23 DIAGNOSIS — K219 Gastro-esophageal reflux disease without esophagitis: Secondary | ICD-10-CM | POA: Diagnosis not present

## 2022-10-23 MED ORDER — ATENOLOL 100 MG PO TABS: 100.0000 mg | ORAL_TABLET | Freq: Every morning | ORAL | 3 refills | Status: DC

## 2022-10-23 MED ORDER — GABAPENTIN 600 MG PO TABS: 600.0000 mg | ORAL_TABLET | Freq: Two times a day (BID) | ORAL | 3 refills | Status: DC

## 2022-10-23 MED ORDER — HYDROCHLOROTHIAZIDE 25 MG PO TABS: 25.0000 mg | ORAL_TABLET | Freq: Every morning | ORAL | 3 refills | Status: DC

## 2022-10-23 MED ORDER — SIMVASTATIN 20 MG PO TABS: 20.0000 mg | ORAL_TABLET | Freq: Every day | ORAL | 3 refills | Status: DC

## 2022-10-23 MED ORDER — LOSARTAN POTASSIUM 100 MG PO TABS: 100.0000 mg | ORAL_TABLET | Freq: Every evening | ORAL | 3 refills | Status: DC

## 2022-10-23 MED ORDER — DULOXETINE HCL 60 MG PO CPEP: 60.0000 mg | ORAL_CAPSULE | Freq: Every morning | ORAL | 3 refills | Status: DC

## 2022-10-24 DIAGNOSIS — Z9049 Acquired absence of other specified parts of digestive tract: Secondary | ICD-10-CM | POA: Diagnosis not present

## 2022-10-24 DIAGNOSIS — G8929 Other chronic pain: Secondary | ICD-10-CM | POA: Diagnosis not present

## 2022-10-24 DIAGNOSIS — Z96651 Presence of right artificial knee joint: Secondary | ICD-10-CM | POA: Diagnosis not present

## 2022-10-24 DIAGNOSIS — M1990 Unspecified osteoarthritis, unspecified site: Secondary | ICD-10-CM | POA: Diagnosis not present

## 2022-10-24 DIAGNOSIS — R0981 Nasal congestion: Secondary | ICD-10-CM | POA: Diagnosis not present

## 2022-10-24 DIAGNOSIS — E46 Unspecified protein-calorie malnutrition: Secondary | ICD-10-CM | POA: Diagnosis not present

## 2022-10-24 DIAGNOSIS — E785 Hyperlipidemia, unspecified: Secondary | ICD-10-CM | POA: Diagnosis not present

## 2022-10-24 DIAGNOSIS — Z96652 Presence of left artificial knee joint: Secondary | ICD-10-CM | POA: Diagnosis not present

## 2022-10-24 DIAGNOSIS — I10 Essential (primary) hypertension: Secondary | ICD-10-CM | POA: Diagnosis not present

## 2022-10-24 DIAGNOSIS — F32A Depression, unspecified: Secondary | ICD-10-CM | POA: Diagnosis not present

## 2022-10-24 DIAGNOSIS — K219 Gastro-esophageal reflux disease without esophagitis: Secondary | ICD-10-CM | POA: Diagnosis not present

## 2022-10-24 DIAGNOSIS — Z471 Aftercare following joint replacement surgery: Secondary | ICD-10-CM | POA: Diagnosis not present

## 2022-10-24 DIAGNOSIS — Z86718 Personal history of other venous thrombosis and embolism: Secondary | ICD-10-CM | POA: Diagnosis not present

## 2022-10-24 DIAGNOSIS — Z604 Social exclusion and rejection: Secondary | ICD-10-CM | POA: Diagnosis not present

## 2022-10-24 DIAGNOSIS — Z9181 History of falling: Secondary | ICD-10-CM | POA: Diagnosis not present

## 2022-10-25 DIAGNOSIS — Z96652 Presence of left artificial knee joint: Secondary | ICD-10-CM | POA: Diagnosis not present

## 2022-10-25 DIAGNOSIS — E46 Unspecified protein-calorie malnutrition: Secondary | ICD-10-CM | POA: Diagnosis not present

## 2022-10-25 DIAGNOSIS — F32A Depression, unspecified: Secondary | ICD-10-CM | POA: Diagnosis not present

## 2022-10-25 DIAGNOSIS — K219 Gastro-esophageal reflux disease without esophagitis: Secondary | ICD-10-CM | POA: Diagnosis not present

## 2022-10-25 DIAGNOSIS — R0981 Nasal congestion: Secondary | ICD-10-CM | POA: Diagnosis not present

## 2022-10-25 DIAGNOSIS — G8929 Other chronic pain: Secondary | ICD-10-CM | POA: Diagnosis not present

## 2022-10-25 DIAGNOSIS — Z471 Aftercare following joint replacement surgery: Secondary | ICD-10-CM | POA: Diagnosis not present

## 2022-10-25 DIAGNOSIS — I10 Essential (primary) hypertension: Secondary | ICD-10-CM | POA: Diagnosis not present

## 2022-10-25 DIAGNOSIS — E785 Hyperlipidemia, unspecified: Secondary | ICD-10-CM | POA: Diagnosis not present

## 2022-10-25 DIAGNOSIS — Z604 Social exclusion and rejection: Secondary | ICD-10-CM | POA: Diagnosis not present

## 2022-10-25 DIAGNOSIS — Z9181 History of falling: Secondary | ICD-10-CM | POA: Diagnosis not present

## 2022-10-25 DIAGNOSIS — Z86718 Personal history of other venous thrombosis and embolism: Secondary | ICD-10-CM | POA: Diagnosis not present

## 2022-10-25 DIAGNOSIS — Z96651 Presence of right artificial knee joint: Secondary | ICD-10-CM | POA: Diagnosis not present

## 2022-10-25 DIAGNOSIS — M1990 Unspecified osteoarthritis, unspecified site: Secondary | ICD-10-CM | POA: Diagnosis not present

## 2022-10-25 DIAGNOSIS — Z9049 Acquired absence of other specified parts of digestive tract: Secondary | ICD-10-CM | POA: Diagnosis not present

## 2022-10-26 DIAGNOSIS — Z86718 Personal history of other venous thrombosis and embolism: Secondary | ICD-10-CM | POA: Diagnosis not present

## 2022-10-26 DIAGNOSIS — F32A Depression, unspecified: Secondary | ICD-10-CM | POA: Diagnosis not present

## 2022-10-26 DIAGNOSIS — I10 Essential (primary) hypertension: Secondary | ICD-10-CM | POA: Diagnosis not present

## 2022-10-26 DIAGNOSIS — Z604 Social exclusion and rejection: Secondary | ICD-10-CM | POA: Diagnosis not present

## 2022-10-26 DIAGNOSIS — R0981 Nasal congestion: Secondary | ICD-10-CM | POA: Diagnosis not present

## 2022-10-26 DIAGNOSIS — Z96652 Presence of left artificial knee joint: Secondary | ICD-10-CM | POA: Diagnosis not present

## 2022-10-26 DIAGNOSIS — Z9181 History of falling: Secondary | ICD-10-CM | POA: Diagnosis not present

## 2022-10-26 DIAGNOSIS — Z96651 Presence of right artificial knee joint: Secondary | ICD-10-CM | POA: Diagnosis not present

## 2022-10-26 DIAGNOSIS — E785 Hyperlipidemia, unspecified: Secondary | ICD-10-CM | POA: Diagnosis not present

## 2022-10-26 DIAGNOSIS — M1990 Unspecified osteoarthritis, unspecified site: Secondary | ICD-10-CM | POA: Diagnosis not present

## 2022-10-26 DIAGNOSIS — Z471 Aftercare following joint replacement surgery: Secondary | ICD-10-CM | POA: Diagnosis not present

## 2022-10-26 DIAGNOSIS — Z9049 Acquired absence of other specified parts of digestive tract: Secondary | ICD-10-CM | POA: Diagnosis not present

## 2022-10-26 DIAGNOSIS — K219 Gastro-esophageal reflux disease without esophagitis: Secondary | ICD-10-CM | POA: Diagnosis not present

## 2022-10-26 DIAGNOSIS — E46 Unspecified protein-calorie malnutrition: Secondary | ICD-10-CM | POA: Diagnosis not present

## 2022-10-26 DIAGNOSIS — G8929 Other chronic pain: Secondary | ICD-10-CM | POA: Diagnosis not present

## 2022-10-27 DIAGNOSIS — K219 Gastro-esophageal reflux disease without esophagitis: Secondary | ICD-10-CM | POA: Diagnosis not present

## 2022-10-27 DIAGNOSIS — M1990 Unspecified osteoarthritis, unspecified site: Secondary | ICD-10-CM | POA: Diagnosis not present

## 2022-10-27 DIAGNOSIS — F32A Depression, unspecified: Secondary | ICD-10-CM | POA: Diagnosis not present

## 2022-10-27 DIAGNOSIS — R0981 Nasal congestion: Secondary | ICD-10-CM | POA: Diagnosis not present

## 2022-10-27 DIAGNOSIS — E785 Hyperlipidemia, unspecified: Secondary | ICD-10-CM | POA: Diagnosis not present

## 2022-10-27 DIAGNOSIS — Z604 Social exclusion and rejection: Secondary | ICD-10-CM | POA: Diagnosis not present

## 2022-10-27 DIAGNOSIS — I10 Essential (primary) hypertension: Secondary | ICD-10-CM | POA: Diagnosis not present

## 2022-10-27 DIAGNOSIS — Z9181 History of falling: Secondary | ICD-10-CM | POA: Diagnosis not present

## 2022-10-27 DIAGNOSIS — Z96652 Presence of left artificial knee joint: Secondary | ICD-10-CM | POA: Diagnosis not present

## 2022-10-27 DIAGNOSIS — Z96651 Presence of right artificial knee joint: Secondary | ICD-10-CM | POA: Diagnosis not present

## 2022-10-27 DIAGNOSIS — E46 Unspecified protein-calorie malnutrition: Secondary | ICD-10-CM | POA: Diagnosis not present

## 2022-10-27 DIAGNOSIS — Z86718 Personal history of other venous thrombosis and embolism: Secondary | ICD-10-CM | POA: Diagnosis not present

## 2022-10-27 DIAGNOSIS — G8929 Other chronic pain: Secondary | ICD-10-CM | POA: Diagnosis not present

## 2022-10-27 DIAGNOSIS — Z9049 Acquired absence of other specified parts of digestive tract: Secondary | ICD-10-CM | POA: Diagnosis not present

## 2022-10-27 DIAGNOSIS — Z471 Aftercare following joint replacement surgery: Secondary | ICD-10-CM | POA: Diagnosis not present

## 2022-10-30 DIAGNOSIS — I10 Essential (primary) hypertension: Secondary | ICD-10-CM | POA: Diagnosis not present

## 2022-10-30 DIAGNOSIS — Z604 Social exclusion and rejection: Secondary | ICD-10-CM | POA: Diagnosis not present

## 2022-10-30 DIAGNOSIS — Z86718 Personal history of other venous thrombosis and embolism: Secondary | ICD-10-CM | POA: Diagnosis not present

## 2022-10-30 DIAGNOSIS — E46 Unspecified protein-calorie malnutrition: Secondary | ICD-10-CM | POA: Diagnosis not present

## 2022-10-30 DIAGNOSIS — Z9049 Acquired absence of other specified parts of digestive tract: Secondary | ICD-10-CM | POA: Diagnosis not present

## 2022-10-30 DIAGNOSIS — Z9181 History of falling: Secondary | ICD-10-CM | POA: Diagnosis not present

## 2022-10-30 DIAGNOSIS — M1990 Unspecified osteoarthritis, unspecified site: Secondary | ICD-10-CM | POA: Diagnosis not present

## 2022-10-30 DIAGNOSIS — K219 Gastro-esophageal reflux disease without esophagitis: Secondary | ICD-10-CM | POA: Diagnosis not present

## 2022-10-30 DIAGNOSIS — Z471 Aftercare following joint replacement surgery: Secondary | ICD-10-CM | POA: Diagnosis not present

## 2022-10-30 DIAGNOSIS — Z96651 Presence of right artificial knee joint: Secondary | ICD-10-CM | POA: Diagnosis not present

## 2022-10-30 DIAGNOSIS — E785 Hyperlipidemia, unspecified: Secondary | ICD-10-CM | POA: Diagnosis not present

## 2022-10-30 DIAGNOSIS — R0981 Nasal congestion: Secondary | ICD-10-CM | POA: Diagnosis not present

## 2022-10-30 DIAGNOSIS — Z96652 Presence of left artificial knee joint: Secondary | ICD-10-CM | POA: Diagnosis not present

## 2022-10-30 DIAGNOSIS — G8929 Other chronic pain: Secondary | ICD-10-CM | POA: Diagnosis not present

## 2022-10-30 DIAGNOSIS — F32A Depression, unspecified: Secondary | ICD-10-CM | POA: Diagnosis not present

## 2022-11-02 DIAGNOSIS — Z604 Social exclusion and rejection: Secondary | ICD-10-CM | POA: Diagnosis not present

## 2022-11-02 DIAGNOSIS — F32A Depression, unspecified: Secondary | ICD-10-CM | POA: Diagnosis not present

## 2022-11-02 DIAGNOSIS — Z96651 Presence of right artificial knee joint: Secondary | ICD-10-CM | POA: Diagnosis not present

## 2022-11-02 DIAGNOSIS — Z96652 Presence of left artificial knee joint: Secondary | ICD-10-CM | POA: Diagnosis not present

## 2022-11-02 DIAGNOSIS — M1990 Unspecified osteoarthritis, unspecified site: Secondary | ICD-10-CM | POA: Diagnosis not present

## 2022-11-02 DIAGNOSIS — Z9181 History of falling: Secondary | ICD-10-CM | POA: Diagnosis not present

## 2022-11-02 DIAGNOSIS — Z9049 Acquired absence of other specified parts of digestive tract: Secondary | ICD-10-CM | POA: Diagnosis not present

## 2022-11-02 DIAGNOSIS — R0981 Nasal congestion: Secondary | ICD-10-CM | POA: Diagnosis not present

## 2022-11-02 DIAGNOSIS — E785 Hyperlipidemia, unspecified: Secondary | ICD-10-CM | POA: Diagnosis not present

## 2022-11-02 DIAGNOSIS — I10 Essential (primary) hypertension: Secondary | ICD-10-CM | POA: Diagnosis not present

## 2022-11-02 DIAGNOSIS — E46 Unspecified protein-calorie malnutrition: Secondary | ICD-10-CM | POA: Diagnosis not present

## 2022-11-02 DIAGNOSIS — Z86718 Personal history of other venous thrombosis and embolism: Secondary | ICD-10-CM | POA: Diagnosis not present

## 2022-11-02 DIAGNOSIS — G8929 Other chronic pain: Secondary | ICD-10-CM | POA: Diagnosis not present

## 2022-11-02 DIAGNOSIS — Z471 Aftercare following joint replacement surgery: Secondary | ICD-10-CM | POA: Diagnosis not present

## 2022-11-02 DIAGNOSIS — K219 Gastro-esophageal reflux disease without esophagitis: Secondary | ICD-10-CM | POA: Diagnosis not present

## 2022-11-03 DIAGNOSIS — Z9049 Acquired absence of other specified parts of digestive tract: Secondary | ICD-10-CM | POA: Diagnosis not present

## 2022-11-03 DIAGNOSIS — I10 Essential (primary) hypertension: Secondary | ICD-10-CM | POA: Diagnosis not present

## 2022-11-03 DIAGNOSIS — Z9181 History of falling: Secondary | ICD-10-CM | POA: Diagnosis not present

## 2022-11-03 DIAGNOSIS — E46 Unspecified protein-calorie malnutrition: Secondary | ICD-10-CM | POA: Diagnosis not present

## 2022-11-03 DIAGNOSIS — M1990 Unspecified osteoarthritis, unspecified site: Secondary | ICD-10-CM | POA: Diagnosis not present

## 2022-11-03 DIAGNOSIS — Z96652 Presence of left artificial knee joint: Secondary | ICD-10-CM | POA: Diagnosis not present

## 2022-11-03 DIAGNOSIS — Z96651 Presence of right artificial knee joint: Secondary | ICD-10-CM | POA: Diagnosis not present

## 2022-11-03 DIAGNOSIS — Z471 Aftercare following joint replacement surgery: Secondary | ICD-10-CM | POA: Diagnosis not present

## 2022-11-03 DIAGNOSIS — G8929 Other chronic pain: Secondary | ICD-10-CM | POA: Diagnosis not present

## 2022-11-03 DIAGNOSIS — Z86718 Personal history of other venous thrombosis and embolism: Secondary | ICD-10-CM | POA: Diagnosis not present

## 2022-11-03 DIAGNOSIS — Z604 Social exclusion and rejection: Secondary | ICD-10-CM | POA: Diagnosis not present

## 2022-11-03 DIAGNOSIS — F32A Depression, unspecified: Secondary | ICD-10-CM | POA: Diagnosis not present

## 2022-11-03 DIAGNOSIS — K219 Gastro-esophageal reflux disease without esophagitis: Secondary | ICD-10-CM | POA: Diagnosis not present

## 2022-11-03 DIAGNOSIS — E785 Hyperlipidemia, unspecified: Secondary | ICD-10-CM | POA: Diagnosis not present

## 2022-11-03 DIAGNOSIS — R0981 Nasal congestion: Secondary | ICD-10-CM | POA: Diagnosis not present

## 2022-11-09 DIAGNOSIS — I10 Essential (primary) hypertension: Secondary | ICD-10-CM | POA: Diagnosis not present

## 2022-11-09 DIAGNOSIS — M1990 Unspecified osteoarthritis, unspecified site: Secondary | ICD-10-CM | POA: Diagnosis not present

## 2022-11-14 ENCOUNTER — Encounter: Payer: Self-pay | Admitting: Neurosurgery

## 2022-11-14 ENCOUNTER — Ambulatory Visit (INDEPENDENT_AMBULATORY_CARE_PROVIDER_SITE_OTHER): Payer: Medicare Other | Admitting: Neurosurgery

## 2022-11-14 VITALS — BP 138/70 | Ht 69.0 in | Wt 270.0 lb

## 2022-11-14 DIAGNOSIS — M48062 Spinal stenosis, lumbar region with neurogenic claudication: Secondary | ICD-10-CM | POA: Diagnosis not present

## 2022-11-14 NOTE — Progress Notes (Signed)
   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  She is having significant difficulty with pain down her legs when she stands or walks.  She is also having back pain.  She has not had recent physical therapy or injections for this.  04/13/2022 Ms. Wile continues to have the symptoms noted below.  She is here today to discuss her imaging.  03/09/22 Ms. Kenise Hiegel is status post the above surgeries.  She is overall doing relatively well from that.  She continues to have pain down her legs.  She is done physical therapy for more than 6 weeks.  She continues to have impacts on a day-to-day life due to her leg discomfort.Marland Kitchen   PHYSICAL EXAMINATION:   Vitals:   11/14/22 1510  BP: 138/70    General: Patient is well developed, well nourished, calm, collected, and in no apparent distress.  NEUROLOGICAL:  General: In no acute distress.  Awake, alert, oriented to person, place, and time. Pupils equal round and reactive to light.   Strength: Side Biceps Triceps Deltoid Interossei Grip Wrist Ext. Wrist Flex.  R 5 5 5 5 5 5 5   L 5 5 5 5 5 5 5    Incision c/d/i   ROS (Neurologic): Negative except as noted above  IMAGING: MRI L spine 03/09/22 IMPRESSION: 1. Thickened nerve roots, particularly visible at L4 and L5, which is nonspecific but can be seen with arachnoiditis. 2. L2-L3 and L4-L5 severe spinal canal stenosis and moderate bilateral neural foraminal narrowing. 3. L1-L2 severe spinal canal stenosis with moderate left and mild right neural foraminal narrowing. 4. L3-L4 moderate spinal canal stenosis with severe right and moderate left neural foraminal narrowing. 5. T12-L1 mild spinal canal stenosis with moderate to severe right and moderate left neural foraminal narrowing. 6. L5-S1 moderate to severe right and mild left neural foraminal narrowing. 7. Status post T11-T12 fusion, with  apparent mild spinal canal stenosis at this level, although this is incompletely evaluated and may be secondary to only being visualized on the upper extent of the sagittal images.   These results will be called to the ordering clinician or representative by the Radiologist Assistant, and communication documented in the PACS or [employer].     Electronically Signed   By: Merilyn Baba M.D.   On: 03/29/2022 11:40  ASSESSMENT/PLAN:  Lindyn Topalian is doing well after neck and thoracic spine surgery.  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  She has numbness in her legs.  She has significant lumbar disease at multiple levels including severe stenosis at L2 through L5 as well as right lateral recess compression at L5-S1 and moderate stenosis at L1-2.  She has chronic numbness in her legs due to her prior cervical and thoracic disease.  We discussed that I cannot guarantee she would have improvement in her symptoms with surgery.  We discussed that there are options including L3-S1 decompression versus L1-S1 decompression.    I recommended that we start physical therapy and consider her for injections.  I will see her back after she has done physical therapy to determine whether we should consider surgical intervention.  I spent a total of 20 minutes in face-to-face and non-face-to-face activities related to this patient's care today.   Meade Maw MD, [facility] Department of Neurosurgery

## 2022-11-21 DIAGNOSIS — M545 Low back pain, unspecified: Secondary | ICD-10-CM | POA: Diagnosis not present

## 2022-11-23 DIAGNOSIS — M545 Low back pain, unspecified: Secondary | ICD-10-CM | POA: Diagnosis not present

## 2022-11-24 DIAGNOSIS — Z96651 Presence of right artificial knee joint: Secondary | ICD-10-CM | POA: Diagnosis not present

## 2022-11-29 DIAGNOSIS — M5442 Lumbago with sciatica, left side: Secondary | ICD-10-CM | POA: Diagnosis not present

## 2022-11-29 DIAGNOSIS — M48062 Spinal stenosis, lumbar region with neurogenic claudication: Secondary | ICD-10-CM | POA: Diagnosis not present

## 2022-11-29 DIAGNOSIS — G8929 Other chronic pain: Secondary | ICD-10-CM | POA: Diagnosis not present

## 2022-11-29 DIAGNOSIS — M5441 Lumbago with sciatica, right side: Secondary | ICD-10-CM | POA: Diagnosis not present

## 2022-11-30 ENCOUNTER — Other Ambulatory Visit: Payer: Self-pay | Admitting: Neurosurgery

## 2022-11-30 DIAGNOSIS — M48062 Spinal stenosis, lumbar region with neurogenic claudication: Secondary | ICD-10-CM | POA: Diagnosis not present

## 2022-11-30 NOTE — Telephone Encounter (Signed)
What are you thoughts on this? You have never prescribed these medications?

## 2022-11-30 NOTE — Telephone Encounter (Signed)
Patient has been notified of Dr.Yarbrough's response. She will contact his PCP for refills.

## 2022-11-30 NOTE — Telephone Encounter (Signed)
Patient is asking if Dr.Yarbrough can refill her medications since he is taking care of her back. Tramadol 50mg  every 6 hrs as needed--usually she only takes it at night Tizanidine 4mg  x2 aday [facility] [address]

## 2022-11-30 NOTE — Telephone Encounter (Signed)
FYI

## 2022-12-01 ENCOUNTER — Other Ambulatory Visit: Payer: Self-pay

## 2022-12-03 MED ORDER — TRAMADOL HCL 50 MG PO TABS: 50.0000 mg | ORAL_TABLET | Freq: Two times a day (BID) | ORAL | 0 refills | Status: DC | PRN

## 2022-12-03 MED ORDER — TIZANIDINE HCL 4 MG PO TABS: 4.0000 mg | ORAL_TABLET | Freq: Two times a day (BID) | ORAL | 0 refills | Status: DC

## 2022-12-04 ENCOUNTER — Ambulatory Visit: Payer: Medicare Other | Admitting: Internal Medicine

## 2022-12-05 DIAGNOSIS — M545 Low back pain, unspecified: Secondary | ICD-10-CM | POA: Diagnosis not present

## 2022-12-07 DIAGNOSIS — M545 Low back pain, unspecified: Secondary | ICD-10-CM | POA: Diagnosis not present

## 2022-12-08 ENCOUNTER — Encounter: Payer: Self-pay | Admitting: Internal Medicine

## 2022-12-08 ENCOUNTER — Ambulatory Visit (INDEPENDENT_AMBULATORY_CARE_PROVIDER_SITE_OTHER): Payer: Medicare Other | Admitting: Internal Medicine

## 2022-12-08 VITALS — BP 110/74 | HR 71 | Ht 69.0 in | Wt 246.0 lb

## 2022-12-08 DIAGNOSIS — D508 Other iron deficiency anemias: Secondary | ICD-10-CM

## 2022-12-08 DIAGNOSIS — E782 Mixed hyperlipidemia: Secondary | ICD-10-CM

## 2022-12-08 DIAGNOSIS — Z1389 Encounter for screening for other disorder: Secondary | ICD-10-CM

## 2022-12-08 DIAGNOSIS — I10 Essential (primary) hypertension: Secondary | ICD-10-CM

## 2022-12-08 DIAGNOSIS — E860 Dehydration: Secondary | ICD-10-CM

## 2022-12-08 DIAGNOSIS — M1990 Unspecified osteoarthritis, unspecified site: Secondary | ICD-10-CM | POA: Insufficient documentation

## 2022-12-08 DIAGNOSIS — R7302 Impaired glucose tolerance (oral): Secondary | ICD-10-CM | POA: Diagnosis not present

## 2022-12-08 LAB — POCT URINALYSIS DIPSTICK
Bilirubin, UA: NEGATIVE
Blood, UA: NEGATIVE
Glucose, UA: NEGATIVE
Ketones, UA: NEGATIVE
Leukocytes, UA: NEGATIVE
Nitrite, UA: NEGATIVE
Protein, UA: POSITIVE — AB
Spec Grav, UA: >=1.03 — AB (ref 1.010–1.025)
Urobilinogen, UA: 0.2 E.U./dL
pH, UA: 5 (ref 5.0–8.0)

## 2022-12-08 MED ORDER — HEMOCYTE-PLUS 106-1 MG PO TABS: 1.0000 | ORAL_TABLET | Freq: Every day | ORAL | 3 refills | Status: DC

## 2022-12-08 NOTE — Progress Notes (Signed)
Established Patient Office Visit  Subjective:  Patient ID: Diana Green, female    DOB: 09-02-1950  Age: 72 y.o. MRN: [id number]  Chief Complaint  Patient presents with   Follow-up    Discuss arthritis/referral    Patient comes in with complaints of feeling fatigue and sweats at night.  Patient recently had epidural injection for lumbar stenosis, and says her symptoms started shortly afterwards.  She denies any fevers or chills, no back pain, no leg pain, no tingling or numbness.  Her back is actually feeling much better now.  However she got concerned about the chills and the sweating. She does not have any sore throat ,no fevers, no headaches no, no dizziness.  She does not have any burning micturition or flank pain.  She had a few loose bowel movements but no active diarrhea or blood in stools. Her appetite is good and she has no nausea or vomiting. Urine dipstick was done in the office which only shows mild dehydration. Patient reassured and advised to rest and drink plenty of fluids at home. Patient is concerned with her widespread arthritis and requests a rheumatology consult.  Will send referral.    No other concerns at this time.   Past Medical History:  Diagnosis Date   Arthritis    Depression    Essential hypertension, benign 10/09/2022   GERD (gastroesophageal reflux disease)    occ no meds   High cholesterol    Hypertension    Impaired glucose tolerance 10/09/2022   Iron deficiency anemia 10/09/2022   Vitamin D deficiency 10/09/2022    Past Surgical History:  Procedure Laterality Date   ANTERIOR CERVICAL DECOMP/DISCECTOMY FUSION N/A 07/08/2021   Procedure: C3-4 ANTERIOR CERVICAL DECOMPRESSION/DISCECTOMY FUSION 1 LEVEL WITH HEDRON;  Surgeon: Venetia Night, MD;  Location: [facility] OR[facility];  Service: Neurosurgery;  Laterality: N/A;   APPLICATION OF INTRAOPERATIVE CT SCAN N/A 09/19/2021   Procedure: APPLICATION OF INTRAOPERATIVE CT SCAN;  Surgeon: Venetia Night,  MD;  Location: [facility];  Service: Neurosurgery;  Laterality: N/A;   APPLICATION OF WOUND VAC  09/19/2021   Procedure: APPLICATION OF WOUND VAC;  Surgeon: Venetia Night, MD;  Location: [facility];  Service: Neurosurgery;;  [place]   BACK SURGERY     thoracic   CESAREAN SECTION  1971   CHOLECYSTECTOMY     COLONOSCOPY     ROTATOR CUFF REPAIR Right 2006   TOTAL KNEE ARTHROPLASTY Left 2014   TOTAL KNEE ARTHROPLASTY Right 09/11/2022   Procedure: TOTAL KNEE ARTHROPLASTY;  Surgeon: Reinaldo Berber, MD;  Location: [facility] OR[facility];  Service: Orthopedics;  Laterality: Right;   TUBAL LIGATION  1975    Social History   Socioeconomic History   Marital status: Married    Spouse name: Not on file   Number of children: Not on file   Years of education: Not on file   Highest education level: Not on file  Occupational History   Not on file  Tobacco Use   Smoking status: Never   Smokeless tobacco: Never  Vaping Use   Vaping Use: Never used  Substance and Sexual Activity   Alcohol use: Yes    Comment: rarely   Drug use: No   Sexual activity: Not on file  Other Topics Concern   Not on file  Social History Narrative   Not on file   Social Determinants of Health   Financial Resource Strain: Not on file  Food Insecurity: No Food Insecurity (09/11/2022)   Hunger Vital Sign  Worried About Programme researcher, broadcasting/film/video in the Last Year: Never true    Ran Out of Food in the Last Year: Never true  Transportation Needs: No Transportation Needs (09/11/2022)   PRAPARE - Administrator, Civil Service (Medical): No    Lack of Transportation (Non-Medical): No  Physical Activity: Not on file  Stress: Not on file  Social Connections: Not on file  Intimate Partner Violence: Not At Risk (09/11/2022)   Humiliation, Afraid, Rape, and Kick questionnaire    Fear of Current or Ex-Partner: No    Emotionally Abused: No    Physically Abused: No    Sexually Abused: No    History reviewed. No pertinent  family history.  No Known Allergies  Review of Systems  Constitutional:  Positive for chills and diaphoresis. Negative for fever, malaise/fatigue and weight loss.  HENT:  Positive for hearing loss. Negative for congestion, ear discharge, ear pain, sinus pain and sore throat.   Eyes: Negative.   Respiratory:  Negative for cough, shortness of breath and wheezing.   Cardiovascular:  Negative for chest pain and leg swelling.  Gastrointestinal:  Negative for heartburn, nausea and vomiting.  Genitourinary:  Negative for dysuria, flank pain and urgency.  Musculoskeletal:  Negative for joint pain and myalgias.  Skin: Negative.   Neurological:  Negative for dizziness, tingling, sensory change, weakness and headaches.  Psychiatric/Behavioral: Negative.         Objective:   BP 110/74   Pulse 71   Ht  (1.753 m)   Wt 246 lb (111.6 kg)   SpO2 97%   BMI 36.33 kg/m   Vitals:   12/08/22 1436  BP: 110/74  Pulse: 71  Height:  (1.753 m)  Weight: 246 lb (111.6 kg)  SpO2: 97%  BMI (Calculated): 36.31    Physical Exam Vitals and nursing note reviewed.  Constitutional:      Appearance: Normal appearance.  Cardiovascular:     Rate and Rhythm: Normal rate and regular rhythm.     Pulses: Normal pulses.     Heart sounds: Normal heart sounds.  Pulmonary:     Effort: Pulmonary effort is normal.     Breath sounds: Normal breath sounds. No rhonchi or rales.  Abdominal:     General: Bowel sounds are normal.     Palpations: Abdomen is soft.  Musculoskeletal:        General: Normal range of motion.     Cervical back: Normal range of motion.  Skin:    General: Skin is warm.     Coloration: Skin is not jaundiced.  Neurological:     General: No focal deficit present.     Mental Status: She is alert and oriented to person, place, and time.    Results for orders placed or performed in visit on 12/08/22  POCT Urinalysis Dipstick (81002)  Result Value Ref Range   Color, UA      Clarity, UA Cloudy    Glucose, UA Negative Negative   Bilirubin, UA Negative    Ketones, UA Negative    Spec Grav, UA >=1.030 (A) 1.010 - 1.025   Blood, UA Negative    pH, UA 5.0 5.0 - 8.0   Protein, UA Positive (A) Negative   Urobilinogen, UA 0.2 0.2 or 1.0 E.U./dL   Nitrite, UA Negative    Leukocytes, UA Negative Negative   Appearance     Odor          Assessment & Plan:  Patient advised to drink plenty of fluids, rest and take Tylenol if needed.  She also needs to start an iron supplement for her anemia, prescription is sent again.  Patient would like to let us know if anything changes.  Otherwise follow-up as scheduled. Problem List Items Addressed This Visit     Mixed hyperlipidemia   Essential hypertension, benign   Impaired glucose tolerance   Iron deficiency anemia   Relevant Medications   Fe Fum-FA-B Cmp-C-Zn-Mg-Mn-Cu (HEMOCYTE-PLUS) 106-1 MG TABS   Arthritis - Primary   Relevant Orders   Ambulatory referral to Rheumatology   Dehydration   Other Visit Diagnoses     Screening for blood or protein in urine       Relevant Orders   POCT Urinalysis Dipstick (16109) (Completed)       Follow up as scheduled.   Total time spent: 25 minutes  Margaretann Loveless, MD  12/08/2022

## 2022-12-11 ENCOUNTER — Telehealth: Payer: Self-pay

## 2022-12-11 NOTE — Telephone Encounter (Signed)
Pt called and left vm regarding rx hemocyte plus, insurance isn't covering rx & she asked if you can send an alternate rx for her? To mail order pharmacy, Please advise

## 2022-12-13 DIAGNOSIS — M48062 Spinal stenosis, lumbar region with neurogenic claudication: Secondary | ICD-10-CM | POA: Diagnosis not present

## 2022-12-13 DIAGNOSIS — M5441 Lumbago with sciatica, right side: Secondary | ICD-10-CM | POA: Diagnosis not present

## 2022-12-13 DIAGNOSIS — G8929 Other chronic pain: Secondary | ICD-10-CM | POA: Diagnosis not present

## 2022-12-13 DIAGNOSIS — M5442 Lumbago with sciatica, left side: Secondary | ICD-10-CM | POA: Diagnosis not present

## 2022-12-13 NOTE — Telephone Encounter (Signed)
Pt informed

## 2022-12-18 DIAGNOSIS — M545 Low back pain, unspecified: Secondary | ICD-10-CM | POA: Diagnosis not present

## 2022-12-20 ENCOUNTER — Telehealth: Payer: Self-pay | Admitting: Internal Medicine

## 2022-12-20 NOTE — Telephone Encounter (Signed)
Patient left VM having questions about her meds

## 2022-12-21 DIAGNOSIS — M545 Low back pain, unspecified: Secondary | ICD-10-CM | POA: Diagnosis not present

## 2022-12-26 DIAGNOSIS — M545 Low back pain, unspecified: Secondary | ICD-10-CM | POA: Diagnosis not present

## 2022-12-27 NOTE — Telephone Encounter (Signed)
Pt called and left vm regarding referral to ortho, looks like she already sees an IT trainer. But pt will have to discuss this at follow up appt in a few weeks

## 2023-01-03 DIAGNOSIS — M2550 Pain in unspecified joint: Secondary | ICD-10-CM | POA: Diagnosis not present

## 2023-01-03 DIAGNOSIS — M159 Polyosteoarthritis, unspecified: Secondary | ICD-10-CM | POA: Diagnosis not present

## 2023-01-03 DIAGNOSIS — M545 Low back pain, unspecified: Secondary | ICD-10-CM | POA: Diagnosis not present

## 2023-01-03 DIAGNOSIS — M3500 Sicca syndrome, unspecified: Secondary | ICD-10-CM | POA: Diagnosis not present

## 2023-01-03 DIAGNOSIS — M539 Dorsopathy, unspecified: Secondary | ICD-10-CM | POA: Diagnosis not present

## 2023-01-05 DIAGNOSIS — M545 Low back pain, unspecified: Secondary | ICD-10-CM | POA: Diagnosis not present

## 2023-01-08 ENCOUNTER — Encounter: Payer: Self-pay | Admitting: Internal Medicine

## 2023-01-08 ENCOUNTER — Ambulatory Visit (INDEPENDENT_AMBULATORY_CARE_PROVIDER_SITE_OTHER): Payer: Medicare Other | Admitting: Internal Medicine

## 2023-01-08 ENCOUNTER — Other Ambulatory Visit: Payer: Self-pay | Admitting: Internal Medicine

## 2023-01-08 VITALS — BP 110/62 | HR 66 | Ht 69.0 in | Wt 248.8 lb

## 2023-01-08 DIAGNOSIS — D508 Other iron deficiency anemias: Secondary | ICD-10-CM | POA: Diagnosis not present

## 2023-01-08 DIAGNOSIS — E782 Mixed hyperlipidemia: Secondary | ICD-10-CM

## 2023-01-08 DIAGNOSIS — R7303 Prediabetes: Secondary | ICD-10-CM | POA: Diagnosis not present

## 2023-01-08 DIAGNOSIS — I10 Essential (primary) hypertension: Secondary | ICD-10-CM

## 2023-01-08 NOTE — Progress Notes (Signed)
Established Patient Office Visit  Subjective:  Patient ID: Diana Green, female    DOB: 05-17-1951  Age: 72 y.o. MRN: [id number]  Chief Complaint  Patient presents with   Follow-up    3 month follow up    Patient comes in for her regular follow-up today.  Patient was recently with complaints of generalized arthritic aches and pains and had concerns about inflammatory arthritis.  She had requested rheumatology consult for which she was set up.  She saw the rheumatologist and it was agreed upon that she has mostly degenerative arthritis, her labs for inflammatory arthritis were all negative.  Today patient feels better she has been more active now and is feeling less tired. She is fasting for blood work.  Patient reports that she has noticed some black-colored stools since he started taking iron supplement.  Patient does have a history of colon polyps, and will be getting a colonoscopy this year.  Patient advised to stop taking her iron supplement for a week and then use Hemoccult cards.    No other concerns at this time.   Past Medical History:  Diagnosis Date   Arthritis    Depression    Essential hypertension, benign 10/09/2022   GERD (gastroesophageal reflux disease)    occ no meds   High cholesterol    Hypertension    Impaired glucose tolerance 10/09/2022   Iron deficiency anemia 10/09/2022   Vitamin D deficiency 10/09/2022    Past Surgical History:  Procedure Laterality Date   ANTERIOR CERVICAL DECOMP/DISCECTOMY FUSION N/A 07/08/2021   Procedure: C3-4 ANTERIOR CERVICAL DECOMPRESSION/DISCECTOMY FUSION 1 LEVEL WITH HEDRON;  Surgeon: Venetia Night, MD;  Location: [facility];  Service: Neurosurgery;  Laterality: N/A;   APPLICATION OF INTRAOPERATIVE CT SCAN N/A 09/19/2021   Procedure: APPLICATION OF INTRAOPERATIVE CT SCAN;  Surgeon: Venetia Night, MD;  Location: [facility];  Service: Neurosurgery;  Laterality: N/A;   APPLICATION OF WOUND VAC  09/19/2021   Procedure:  APPLICATION OF WOUND VAC;  Surgeon: Venetia Night, MD;  Location: [facility];  Service: Neurosurgery;;  [facility]   BACK SURGERY     thoracic   CESAREAN SECTION  1971   CHOLECYSTECTOMY     COLONOSCOPY     ROTATOR CUFF REPAIR Right 2006   TOTAL KNEE ARTHROPLASTY Left 2014   TOTAL KNEE ARTHROPLASTY Right 09/11/2022   Procedure: TOTAL KNEE ARTHROPLASTY;  Surgeon: Reinaldo Berber, MD;  Location: [facility];  Service: Orthopedics;  Laterality: Right;   TUBAL LIGATION  1975    Social History   Socioeconomic History   Marital status: Married    Spouse name: Not on file   Number of children: Not on file   Years of education: Not on file   Highest education level: Not on file  Occupational History   Not on file  Tobacco Use   Smoking status: Never   Smokeless tobacco: Never  Vaping Use   Vaping Use: Never used  Substance and Sexual Activity   Alcohol use: Yes    Comment: rarely   Drug use: No   Sexual activity: Not on file  Other Topics Concern   Not on file  Social History Narrative   Not on file   Social Determinants of Health   Financial Resource Strain: Not on file  Food Insecurity: No Food Insecurity (09/11/2022)   Hunger Vital Sign    Worried About Running Out of Food in the Last Year: Never true    Ran Out of Food in the  Last Year: Never true  Transportation Needs: No Transportation Needs (09/11/2022)   PRAPARE - Administrator, Civil Service (Medical): No    Lack of Transportation (Non-Medical): No  Physical Activity: Not on file  Stress: Not on file  Social Connections: Not on file  Intimate Partner Violence: Not At Risk (09/11/2022)   Humiliation, Afraid, Rape, and Kick questionnaire    Fear of Current or Ex-Partner: No    Emotionally Abused: No    Physically Abused: No    Sexually Abused: No    History reviewed. No pertinent family history.  No Known Allergies  Review of Systems  Constitutional:  Negative for diaphoresis, fever,  malaise/fatigue and weight loss.  HENT: Negative.    Eyes: Negative.   Respiratory:  Negative for cough, shortness of breath and wheezing.   Cardiovascular:  Negative for chest pain, palpitations, orthopnea, leg swelling and PND.  Gastrointestinal:  Negative for abdominal pain, blood in stool, constipation, diarrhea, heartburn, melena, nausea and vomiting.  Genitourinary:  Negative for dysuria, flank pain, frequency, hematuria and urgency.  Musculoskeletal:  Positive for joint pain. Negative for back pain, falls, myalgias and neck pain.  Skin: Negative.   Neurological:  Negative for dizziness, tingling, tremors, sensory change, speech change, seizures and headaches.  Psychiatric/Behavioral:  Negative for depression and hallucinations. The patient is not nervous/anxious.        Objective:   BP 110/62   Pulse 66   Ht 5\' 9"  (1.753 m)   Wt 248 lb 12.8 oz (112.9 kg)   SpO2 97%   BMI 36.74 kg/m   Vitals:   01/08/23 1101  BP: 110/62  Pulse: 66  Height: 5\' 9"  (1.753 m)  Weight: 248 lb 12.8 oz (112.9 kg)  SpO2: 97%  BMI (Calculated): 36.72    Physical Exam Vitals and nursing note reviewed.  Constitutional:      Appearance: Normal appearance.  Cardiovascular:     Rate and Rhythm: Normal rate and regular rhythm.     Pulses: Normal pulses.     Heart sounds: Normal heart sounds. No murmur heard. Pulmonary:     Effort: Pulmonary effort is normal.     Breath sounds: Normal breath sounds. No wheezing, rhonchi or rales.  Abdominal:     General: Bowel sounds are normal.  Musculoskeletal:        General: No swelling or tenderness. Normal range of motion.     Cervical back: Normal range of motion and neck supple.     Right lower leg: No edema.     Left lower leg: No edema.  Skin:    General: Skin is warm and dry.  Neurological:     General: No focal deficit present.     Mental Status: She is alert and oriented to person, place, and time.  Psychiatric:        Mood and Affect:  Mood normal.        Behavior: Behavior normal.     No results found for any visits on 01/08/23.      Assessment & Plan:  Check blood work today. Patient will stop her iron supplement for a week.  Check Hemoccult cards.  Will discuss results over the phone. Problem List Items Addressed This Visit     Mixed hyperlipidemia   Relevant Orders   Lipid Profile   Essential hypertension, benign   Relevant Orders   CMP14+EGFR   Iron deficiency anemia   Relevant Orders   CBC With Differential  Iron and IBC ([id number])   Ferritin   Prediabetes - Primary   Relevant Orders   Hemoglobin A1c    Return in about 3 months (around 04/10/2023).   Total time spent: 30 minutes  Margaretann Loveless, MD  01/08/2023   This document may have been prepared by [facility] Voice Recognition software and as such may include unintentional dictation errors.

## 2023-01-09 ENCOUNTER — Ambulatory Visit: Payer: Medicare Other | Admitting: Podiatry

## 2023-01-09 DIAGNOSIS — M7752 Other enthesopathy of left foot: Secondary | ICD-10-CM

## 2023-01-09 LAB — CBC WITH DIFFERENTIAL
Basophils Absolute: 0.1 10*3/uL (ref 0.0–0.2)
Basos: 1 %
EOS (ABSOLUTE): 0.3 10*3/uL (ref 0.0–0.4)
Eos: 4 %
Hematocrit: 32.6 % — ABNORMAL LOW (ref 34.0–46.6)
Hemoglobin: 10.3 g/dL — ABNORMAL LOW (ref 11.1–15.9)
Immature Grans (Abs): 0 10*3/uL (ref 0.0–0.1)
Immature Granulocytes: 0 %
Lymphocytes Absolute: 1.6 10*3/uL (ref 0.7–3.1)
Lymphs: 28 %
MCH: 27.4 pg (ref 26.6–33.0)
MCHC: 31.6 g/dL (ref 31.5–35.7)
MCV: 87 fL (ref 79–97)
Monocytes Absolute: 0.4 10*3/uL (ref 0.1–0.9)
Monocytes: 7 %
Neutrophils Absolute: 3.5 10*3/uL (ref 1.4–7.0)
Neutrophils: 60 %
RBC: 3.76 x10E6/uL — ABNORMAL LOW (ref 3.77–5.28)
RDW: 12.9 % (ref 11.7–15.4)
WBC: 5.9 10*3/uL (ref 3.4–10.8)

## 2023-01-09 LAB — CMP14+EGFR
ALT: 12 IU/L (ref 0–32)
AST: 18 IU/L (ref 0–40)
Albumin/Globulin Ratio: 1.4 (ref 1.2–2.2)
Albumin: 4.2 g/dL (ref 3.8–4.8)
Alkaline Phosphatase: 90 IU/L (ref 44–121)
BUN/Creatinine Ratio: 15 (ref 12–28)
BUN: 17 mg/dL (ref 8–27)
Bilirubin Total: 0.2 mg/dL (ref 0.0–1.2)
CO2: 25 mmol/L (ref 20–29)
Calcium: 10.4 mg/dL — ABNORMAL HIGH (ref 8.7–10.3)
Chloride: 100 mmol/L (ref 96–106)
Creatinine, Ser: 1.17 mg/dL — ABNORMAL HIGH (ref 0.57–1.00)
Globulin, Total: 3 g/dL (ref 1.5–4.5)
Glucose: 95 mg/dL (ref 70–99)
Potassium: 4.3 mmol/L (ref 3.5–5.2)
Sodium: 140 mmol/L (ref 134–144)
Total Protein: 7.2 g/dL (ref 6.0–8.5)
eGFR: 50 mL/min/{1.73_m2} — ABNORMAL LOW (ref >59)

## 2023-01-09 LAB — LIPID PANEL
Chol/HDL Ratio: 3.4 ratio (ref 0.0–4.4)
Cholesterol, Total: 181 mg/dL (ref 100–199)
HDL: 54 mg/dL (ref >39)
LDL Chol Calc (NIH): 94 mg/dL (ref 0–99)
Triglycerides: 194 mg/dL — ABNORMAL HIGH (ref 0–149)
VLDL Cholesterol Cal: 33 mg/dL (ref 5–40)

## 2023-01-09 LAB — IRON AND TIBC
Iron Saturation: 15 % (ref 15–55)
Iron: 45 ug/dL (ref 27–139)
Total Iron Binding Capacity: 300 ug/dL (ref 250–450)
UIBC: 255 ug/dL (ref 118–369)

## 2023-01-09 LAB — HEMOGLOBIN A1C
Est. average glucose Bld gHb Est-mCnc: 143 mg/dL
Hgb A1c MFr Bld: 6.6 % — ABNORMAL HIGH (ref 4.8–5.6)

## 2023-01-09 LAB — FERRITIN: Ferritin: 213 ng/mL — ABNORMAL HIGH (ref 15–150)

## 2023-01-09 NOTE — Progress Notes (Signed)
Subjective:  Patient ID: Diana Green, female    DOB: May 16, 1951,  MRN: [id number]  Chief Complaint  Patient presents with   Foot Pain    72 y.o. female presents with the above complaint.  Patient presents with primary complaint of left ankle pain that has been on for quite some time is progressive gotten worse worse with ambulation worse with pressure she has not seen MRIs prior to seeing me.  Pain is 7 out of 10 dull achy in nature deep intra-articular pain noted.  She would like to discuss treatment options for this.   Review of Systems: Negative except as noted in the HPI. Denies N/V/F/Ch.  Past Medical History:  Diagnosis Date   Arthritis    Depression    Essential hypertension, benign 10/09/2022   GERD (gastroesophageal reflux disease)    occ no meds   High cholesterol    Hypertension    Impaired glucose tolerance 10/09/2022   Iron deficiency anemia 10/09/2022   Vitamin D deficiency 10/09/2022    Current Outpatient Medications:    acetaminophen (TYLENOL) 500 MG tablet, Take 1,000 mg by mouth 3 (three) times daily., Disp: , Rfl:    atenolol (TENORMIN) 100 MG tablet, Take 1 tablet (100 mg total) by mouth in the morning., Disp: 90 tablet, Rfl: 3   DULoxetine (CYMBALTA) 60 MG capsule, Take 1 capsule (60 mg total) by mouth in the morning., Disp: 90 capsule, Rfl: 3   Fe Fum-FA-B Cmp-C-Zn-Mg-Mn-Cu (HEMOCYTE-PLUS) 106-1 MG TABS, Take 1 tablet by mouth daily., Disp: 90 tablet, Rfl: 3   gabapentin (NEURONTIN) 600 MG tablet, Take 1 tablet (600 mg total) by mouth 2 (two) times daily., Disp: 180 tablet, Rfl: 3   hydrochlorothiazide (HYDRODIURIL) 25 MG tablet, Take 1 tablet (25 mg total) by mouth in the morning., Disp: 90 tablet, Rfl: 3   Lidocaine 4 % PTCH, Place 1 patch onto the skin daily as needed (back pain)., Disp: , Rfl:    losartan (COZAAR) 100 MG tablet, Take 1 tablet (100 mg total) by mouth at bedtime., Disp: 90 tablet, Rfl: 3   senna (SENOKOT) 8.6 MG TABS tablet, Take 1 tablet  (8.6 mg total) by mouth 2 (two) times daily. (Patient taking differently: Take 1 tablet by mouth at bedtime.), Disp: 60 tablet, Rfl: 0   simvastatin (ZOCOR) 20 MG tablet, Take 1 tablet (20 mg total) by mouth at bedtime., Disp: 90 tablet, Rfl: 3  Social History   Tobacco Use  Smoking Status Never  Smokeless Tobacco Never    No Known Allergies Objective:  There were no vitals filed for this visit. There is no height or weight on file to calculate BMI. Constitutional Well developed. Well nourished.  Vascular Dorsalis pedis pulses palpable bilaterally. Posterior tibial pulses palpable bilaterally. Capillary refill normal to all digits.  No cyanosis or clubbing noted. Pedal hair growth normal.  Neurologic Normal speech. Oriented to person, place, and time. Epicritic sensation to light touch grossly present bilaterally.  Dermatologic Nails well groomed and normal in appearance. No open wounds. No skin lesions.  Orthopedic: Pain on palpation to the left ankle joint pain at the medial gutter.  Pain with range of motion of the joint deep intra-articular pain noted.  No other bony abnormalities noted.  No pain at the Achilles tendon peroneal tendon ATFL ligament   Radiographs: None Assessment:   1. Capsulitis of ankle, left    Plan:  Patient was evaluated and treated and all questions answered.  Left ankle capsulitis -All questions  and concerns were discussed with the patient in extensive detail given the amount of pain that she is having she will benefit from a steroid injection to help decrease acute inflammatory component associate with pain.  Patient agrees with plan we will proceed with steroid injection -A steroid injection was performed at left ankle using 1% plain Lidocaine and 10 mg of Kenalog. This was well tolerated.   No follow-ups on file.

## 2023-01-10 DIAGNOSIS — M545 Low back pain, unspecified: Secondary | ICD-10-CM | POA: Diagnosis not present

## 2023-01-11 ENCOUNTER — Other Ambulatory Visit: Payer: Self-pay | Admitting: Internal Medicine

## 2023-01-11 ENCOUNTER — Ambulatory Visit: Payer: Medicare Other | Admitting: Cardiovascular Disease

## 2023-01-11 DIAGNOSIS — D528 Other folate deficiency anemias: Secondary | ICD-10-CM

## 2023-01-11 DIAGNOSIS — D518 Other vitamin B12 deficiency anemias: Secondary | ICD-10-CM

## 2023-01-12 ENCOUNTER — Telehealth: Payer: Self-pay

## 2023-01-12 NOTE — Telephone Encounter (Signed)
Patient called asking for a call back 

## 2023-01-16 ENCOUNTER — Encounter: Payer: Self-pay | Admitting: Neurosurgery

## 2023-01-16 ENCOUNTER — Ambulatory Visit: Payer: Medicare Other | Admitting: Neurosurgery

## 2023-01-16 VITALS — BP 124/80 | Ht 69.0 in | Wt 248.0 lb

## 2023-01-16 DIAGNOSIS — M545 Low back pain, unspecified: Secondary | ICD-10-CM | POA: Diagnosis not present

## 2023-01-16 DIAGNOSIS — M48062 Spinal stenosis, lumbar region with neurogenic claudication: Secondary | ICD-10-CM

## 2023-01-16 NOTE — Progress Notes (Signed)
   DOS: 07/08/21 (C3-4 ACDF) 09/19/21 (T11-12 PSFD)  HISTORY OF PRESENT ILLNESS: 01/16/2023 She has had an injection last month and is doing much better.  She is also working with physical therapy which has greatly helped her.  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  She is having significant difficulty with pain down her legs when she stands or walks.  She is also having back pain.  She has not had recent physical therapy or injections for this.  04/13/2022 Ms. Wile continues to have the symptoms noted below.  She is here today to discuss her imaging.  03/09/22 Ms. Baylin Rascon is status post the above surgeries.  She is overall doing relatively well from that.  She continues to have pain down her legs.  She is done physical therapy for more than 6 weeks.  She continues to have impacts on a day-to-day life due to her leg discomfort.Marland Kitchen   PHYSICAL EXAMINATION:   There were no vitals filed for this visit.   General: Patient is well developed, well nourished, calm, collected, and in no apparent distress.  NEUROLOGICAL:  General: In no acute distress.  Awake, alert, oriented to person, place, and time. Pupils equal round and reactive to light.   Strength: Side Biceps Triceps Deltoid Interossei Grip Wrist Ext. Wrist Flex.  R 5 5 5 5 5 5 5   L 5 5 5 5 5 5 5    Incision c/d/i   ROS (Neurologic): Negative except as noted above  IMAGING: MRI L spine 03/09/22 IMPRESSION: 1. Thickened nerve roots, particularly visible at L4 and L5, which is nonspecific but can be seen with arachnoiditis. 2. L2-L3 and L4-L5 severe spinal canal stenosis and moderate bilateral neural foraminal narrowing. 3. L1-L2 severe spinal canal stenosis with moderate left and mild right neural foraminal narrowing. 4. L3-L4 moderate spinal canal stenosis with severe right and moderate left neural foraminal narrowing. 5. T12-L1 mild spinal canal stenosis with moderate to severe right and  moderate left neural foraminal narrowing. 6. L5-S1 moderate to severe right and mild left neural foraminal narrowing. 7. Status post T11-T12 fusion, with apparent mild spinal canal stenosis at this level, although this is incompletely evaluated and may be secondary to only being visualized on the upper extent of the sagittal images.   These results will be called to the ordering clinician or representative by the Radiologist Assistant, and communication documented in the PACS or [employer].     Electronically Signed   By: Wiliam Ke M.D.   On: 03/29/2022 11:40  ASSESSMENT/PLAN:  Bonnita Glatter is doing well after neck and thoracic spine surgery.  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  At this point, I have recommended that she continue nonoperative management as she is doing much better.  We will see her back if her symptoms worsen.  I spent a total of 10 minutes in face-to-face and non-face-to-face activities related to this patient's care today.   Venetia Night MD, [facility] Department of Neurosurgery

## 2023-01-18 DIAGNOSIS — M545 Low back pain, unspecified: Secondary | ICD-10-CM | POA: Diagnosis not present

## 2023-01-22 DIAGNOSIS — M545 Low back pain, unspecified: Secondary | ICD-10-CM | POA: Diagnosis not present

## 2023-01-30 DIAGNOSIS — M545 Low back pain, unspecified: Secondary | ICD-10-CM | POA: Diagnosis not present

## 2023-02-01 DIAGNOSIS — M545 Low back pain, unspecified: Secondary | ICD-10-CM | POA: Diagnosis not present

## 2023-02-02 ENCOUNTER — Other Ambulatory Visit: Payer: Self-pay | Admitting: Internal Medicine

## 2023-02-02 ENCOUNTER — Telehealth: Payer: Self-pay | Admitting: Internal Medicine

## 2023-02-02 ENCOUNTER — Other Ambulatory Visit (INDEPENDENT_AMBULATORY_CARE_PROVIDER_SITE_OTHER): Payer: Medicare Other | Admitting: Internal Medicine

## 2023-02-02 DIAGNOSIS — D508 Other iron deficiency anemias: Secondary | ICD-10-CM

## 2023-02-02 DIAGNOSIS — K921 Melena: Secondary | ICD-10-CM

## 2023-02-02 LAB — POC HEMOCCULT BLD/STL (HOME/3-CARD/SCREEN)
Card #2 Fecal Occult Blod, POC: POSITIVE
Card #3 Fecal Occult Blood, POC: POSITIVE
Fecal Occult Blood, POC: POSITIVE — AB

## 2023-02-02 NOTE — Telephone Encounter (Signed)
Patient left VM checking to see if we got her urine specimen that she dropped off on Wednesday. Did you see this?

## 2023-02-05 ENCOUNTER — Telehealth: Payer: Self-pay | Admitting: Internal Medicine

## 2023-02-05 NOTE — Telephone Encounter (Signed)
Patient left VM returning our call.

## 2023-02-05 NOTE — Progress Notes (Signed)
Patient notified

## 2023-02-06 DIAGNOSIS — M545 Low back pain, unspecified: Secondary | ICD-10-CM | POA: Diagnosis not present

## 2023-02-07 DIAGNOSIS — M25512 Pain in left shoulder: Secondary | ICD-10-CM | POA: Diagnosis not present

## 2023-02-07 DIAGNOSIS — M19012 Primary osteoarthritis, left shoulder: Secondary | ICD-10-CM | POA: Diagnosis not present

## 2023-02-07 DIAGNOSIS — G8929 Other chronic pain: Secondary | ICD-10-CM | POA: Diagnosis not present

## 2023-02-07 DIAGNOSIS — M7582 Other shoulder lesions, left shoulder: Secondary | ICD-10-CM | POA: Diagnosis not present

## 2023-02-08 ENCOUNTER — Telehealth: Payer: Self-pay

## 2023-02-08 NOTE — Telephone Encounter (Signed)
PT LM asking for call back did not specify what she needed

## 2023-02-12 ENCOUNTER — Telehealth: Payer: Self-pay

## 2023-02-12 DIAGNOSIS — M545 Low back pain, unspecified: Secondary | ICD-10-CM | POA: Diagnosis not present

## 2023-02-12 NOTE — Telephone Encounter (Signed)
Patient is requesting that you call and get her colonoscopy appt moved to an earlier date

## 2023-02-14 DIAGNOSIS — M545 Low back pain, unspecified: Secondary | ICD-10-CM | POA: Diagnosis not present

## 2023-02-14 NOTE — Telephone Encounter (Signed)
Spoke to and verablized understanding.

## 2023-02-15 ENCOUNTER — Other Ambulatory Visit: Payer: Self-pay | Admitting: Internal Medicine

## 2023-02-15 DIAGNOSIS — K921 Melena: Secondary | ICD-10-CM

## 2023-02-15 NOTE — Telephone Encounter (Signed)
Spoke with patient who was informed of referral location switched.

## 2023-02-16 ENCOUNTER — Encounter: Payer: Self-pay | Admitting: Gastroenterology

## 2023-02-16 ENCOUNTER — Telehealth: Payer: Self-pay

## 2023-02-16 NOTE — Telephone Encounter (Signed)
Patient called asking for call back with the phone number for the Colonoscopy, she states she doesn't have it

## 2023-02-19 ENCOUNTER — Ambulatory Visit (INDEPENDENT_AMBULATORY_CARE_PROVIDER_SITE_OTHER): Payer: Medicare Other | Admitting: Internal Medicine

## 2023-02-19 ENCOUNTER — Encounter: Payer: Self-pay | Admitting: Internal Medicine

## 2023-02-19 VITALS — BP 150/90 | HR 62 | Ht 69.0 in | Wt 240.4 lb

## 2023-02-19 DIAGNOSIS — D508 Other iron deficiency anemias: Secondary | ICD-10-CM

## 2023-02-19 DIAGNOSIS — I10 Essential (primary) hypertension: Secondary | ICD-10-CM | POA: Diagnosis not present

## 2023-02-19 DIAGNOSIS — K921 Melena: Secondary | ICD-10-CM | POA: Diagnosis not present

## 2023-02-19 DIAGNOSIS — R7303 Prediabetes: Secondary | ICD-10-CM

## 2023-02-19 DIAGNOSIS — Z1211 Encounter for screening for malignant neoplasm of colon: Secondary | ICD-10-CM | POA: Diagnosis not present

## 2023-02-19 NOTE — Progress Notes (Signed)
Established Patient Office Visit  Subjective:  Patient ID: Diana Green, female    DOB: 1951/06/11  Age: 72 y.o. MRN: [id number]  Chief Complaint  Patient presents with   Follow-up    Discuss colonoscopy and blood in stool    Patient comes in to discuss her Hemoccult cards which showed heme positive stools on 3 out of 3.  The patient's hemoglobin had dropped recently, although she has history of anemia and is currently on iron supplements. Has history of colon polyps and is due to get a colonoscopy this year.  Today she is very concerned that since her Hemoccult cards are positive, her appointment is not until September of this year.  Patient is worried and anxious and her blood pressure is also high. Agrees to do a Cologuard ahead of her colonoscopy just for reassurance.  Order sent. Patient will continue taking her iron supplement at this time. Mentions she gets bloating, and occasional abdominal cramps.  Advised to start a probiotic and increase fiber in her diet.    No other concerns at this time.   Past Medical History:  Diagnosis Date   Arthritis    Depression    Essential hypertension, benign 10/09/2022   GERD (gastroesophageal reflux disease)    occ no meds   High cholesterol    Hypertension    Impaired glucose tolerance 10/09/2022   Iron deficiency anemia 10/09/2022   Vitamin D deficiency 10/09/2022    Past Surgical History:  Procedure Laterality Date   ANTERIOR CERVICAL DECOMP/DISCECTOMY FUSION N/A 07/08/2021   Procedure: C3-4 ANTERIOR CERVICAL DECOMPRESSION/DISCECTOMY FUSION 1 LEVEL WITH HEDRON;  Surgeon: Venetia Night, MD;  Location: [facility];  Service: Neurosurgery;  Laterality: N/A;   APPLICATION OF INTRAOPERATIVE CT SCAN N/A 09/19/2021   Procedure: APPLICATION OF INTRAOPERATIVE CT SCAN;  Surgeon: Venetia Night, MD;  Location: [facility];  Service: Neurosurgery;  Laterality: N/A;   APPLICATION OF WOUND VAC  09/19/2021   Procedure: APPLICATION OF WOUND  VAC;  Surgeon: Venetia Night, MD;  Location: [facility];  Service: Neurosurgery;;  Pre[facility]a   BACK SURGERY     thoracic   CESAREAN SECTION  1971   CHOLECYSTECTOMY     COLONOSCOPY     ROTATOR CUFF REPAIR Right 2006   TOTAL KNEE ARTHROPLASTY Left 2014   TOTAL KNEE ARTHROPLASTY Right 09/11/2022   Procedure: TOTAL KNEE ARTHROPLASTY;  Surgeon: Reinaldo Berber, MD;  Location: [facility];  Service: Orthopedics;  Laterality: Right;   TUBAL LIGATION  1975    Social History   Socioeconomic History   Marital status: Married    Spouse name: Not on file   Number of children: Not on file   Years of education: Not on file   Highest education level: Not on file  Occupational History   Not on file  Tobacco Use   Smoking status: Never   Smokeless tobacco: Never  Vaping Use   Vaping Use: Never used  Substance and Sexual Activity   Alcohol use: Yes    Comment: rarely   Drug use: No   Sexual activity: Not on file  Other Topics Concern   Not on file  Social History Narrative   Not on file   Social Determinants of Health   Financial Resource Strain: Not on file  Food Insecurity: No Food Insecurity (09/11/2022)   Hunger Vital Sign    Worried About Running Out of Food in the Last Year: Never true    Ran Out of Food in the Last  Year: Never true  Transportation Needs: No Transportation Needs (09/11/2022)   PRAPARE - Administrator, Civil Service (Medical): No    Lack of Transportation (Non-Medical): No  Physical Activity: Not on file  Stress: Not on file  Social Connections: Not on file  Intimate Partner Violence: Not At Risk (09/11/2022)   Humiliation, Afraid, Rape, and Kick questionnaire    Fear of Current or Ex-Partner: No    Emotionally Abused: No    Physically Abused: No    Sexually Abused: No    History reviewed. No pertinent family history.  No Known Allergies  Review of Systems  Constitutional: Negative.   HENT: Negative.  Negative for ear pain, hearing loss and  tinnitus.   Eyes: Negative.   Respiratory: Negative.  Negative for cough and shortness of breath.   Cardiovascular: Negative.  Negative for chest pain, palpitations and leg swelling.  Gastrointestinal: Negative.  Negative for abdominal pain, constipation, diarrhea, heartburn, nausea and vomiting.  Genitourinary: Negative.  Negative for dysuria and flank pain.  Musculoskeletal: Negative.  Negative for joint pain and myalgias.  Skin: Negative.   Neurological: Negative.  Negative for dizziness and headaches.  Endo/Heme/Allergies: Negative.   Psychiatric/Behavioral: Negative.  Negative for depression and suicidal ideas. The patient is not nervous/anxious.        Objective:   BP (!) 150/90   Pulse 62   Ht 5\' 9"  (1.753 m)   Wt 240 lb 6.4 oz (109 kg)   SpO2 97%   BMI 35.50 kg/m   Vitals:   02/19/23 1008  BP: (!) 150/90  Pulse: 62  Height: 5\' 9"  (1.753 m)  Weight: 240 lb 6.4 oz (109 kg)  SpO2: 97%  BMI (Calculated): 35.48    Physical Exam Vitals and nursing note reviewed.  Constitutional:      Appearance: Normal appearance.  HENT:     Head: Normocephalic and atraumatic.     Nose: Nose normal.     Mouth/Throat:     Mouth: Mucous membranes are moist.     Pharynx: Oropharynx is clear.  Eyes:     Conjunctiva/sclera: Conjunctivae normal.     Pupils: Pupils are equal, round, and reactive to light.  Cardiovascular:     Rate and Rhythm: Normal rate and regular rhythm.     Pulses: Normal pulses.     Heart sounds: Normal heart sounds. No murmur heard. Pulmonary:     Effort: Pulmonary effort is normal.     Breath sounds: Normal breath sounds. No wheezing.  Abdominal:     General: Bowel sounds are normal.     Palpations: Abdomen is soft.     Tenderness: There is no abdominal tenderness. There is no right CVA tenderness or left CVA tenderness.  Musculoskeletal:        General: Normal range of motion.     Cervical back: Normal range of motion.     Right lower leg: No edema.      Left lower leg: No edema.  Skin:    General: Skin is warm and dry.  Neurological:     General: No focal deficit present.     Mental Status: She is alert and oriented to person, place, and time.  Psychiatric:        Mood and Affect: Mood normal.        Behavior: Behavior normal.      No results found for any visits on 02/19/23.  Recent Results (from the past 2160 hour(s))  POCT Urinalysis  Dipstick [phone])     Status: Abnormal   Collection Time: 12/08/22  3:09 PM  Result Value Ref Range   Color, UA     Clarity, UA Cloudy    Glucose, UA Negative Negative   Bilirubin, UA Negative    Ketones, UA Negative    Spec Grav, UA >=1.030 (A) 1.010 - 1.025   Blood, UA Negative    pH, UA 5.0 5.0 - 8.0   Protein, UA Positive (A) Negative    Comment: 30mg /dl   Urobilinogen, UA 0.2 0.2 or 1.0 E.U./dL   Nitrite, UA Negative    Leukocytes, UA Negative Negative   Appearance     Odor    CMP14+EGFR     Status: Abnormal   Collection Time: 01/08/23 11:41 AM  Result Value Ref Range   Glucose 95 70 - 99 mg/dL   BUN 17 8 - 27 mg/dL   Creatinine, Ser 5.78 (H) 0.57 - 1.00 mg/dL   eGFR 50 (L) >46 NG/EXB/2.84   BUN/Creatinine Ratio 15 12 - 28   Sodium 140 134 - 144 mmol/L   Potassium 4.3 3.5 - 5.2 mmol/L   Chloride 100 96 - 106 mmol/L   CO2 25 20 - 29 mmol/L   Calcium 10.4 (H) 8.7 - 10.3 mg/dL   Total Protein 7.2 6.0 - 8.5 g/dL   Albumin 4.2 3.8 - 4.8 g/dL   Globulin, Total 3.0 1.5 - 4.5 g/dL   Albumin/Globulin Ratio 1.4 1.2 - 2.2   Bilirubin Total 0.2 0.0 - 1.2 mg/dL   Alkaline Phosphatase 90 44 - 121 IU/L   AST 18 0 - 40 IU/L   ALT 12 0 - 32 IU/L  CBC With Differential     Status: Abnormal   Collection Time: 01/08/23 11:41 AM  Result Value Ref Range   WBC 5.9 3.4 - 10.8 x10E3/uL   RBC 3.76 (L) 3.77 - 5.28 x10E6/uL   Hemoglobin 10.3 (L) 11.1 - 15.9 g/dL   Hematocrit 13.2 (L) 44.0 - 46.6 %   MCV 87 79 - 97 fL   MCH 27.4 26.6 - 33.0 pg   MCHC 31.6 31.5 - 35.7 g/dL   RDW 10.2 72.5 -  36.6 %   Neutrophils 60 Not Estab. %   Lymphs 28 Not Estab. %   Monocytes 7 Not Estab. %   Eos 4 Not Estab. %   Basos 1 Not Estab. %   Neutrophils Absolute 3.5 1.4 - 7.0 x10E3/uL   Lymphocytes Absolute 1.6 0.7 - 3.1 x10E3/uL   Monocytes Absolute 0.4 0.1 - 0.9 x10E3/uL   EOS (ABSOLUTE) 0.3 0.0 - 0.4 x10E3/uL   Basophils Absolute 0.1 0.0 - 0.2 x10E3/uL   Immature Granulocytes 0 Not Estab. %   Immature Grans (Abs) 0.0 0.0 - 0.1 x10E3/uL  Lipid panel     Status: Abnormal   Collection Time: 01/08/23 11:41 AM  Result Value Ref Range   Cholesterol, Total 181 100 - 199 mg/dL   Triglycerides 440 (H) 0 - 149 mg/dL   HDL 54 >34 mg/dL   VLDL Cholesterol Cal 33 5 - 40 mg/dL   LDL Chol Calc (NIH) 94 0 - 99 mg/dL   Chol/HDL Ratio 3.4 0.0 - 4.4 ratio    Comment:                                   T. Chol/HDL Ratio  Men  Women                               1/2 Avg.Risk  3.4    3.3                                   Avg.Risk  5.0    4.4                                2X Avg.Risk  9.6    7.1                                3X Avg.Risk 23.4   11.0   Iron and TIBC     Status: None   Collection Time: 01/08/23 11:41 AM  Result Value Ref Range   Total Iron Binding Capacity 300 250 - 450 ug/dL   UIBC 191 478 - 295 ug/dL   Iron 45 27 - 621 ug/dL   Iron Saturation 15 15 - 55 %  Hemoglobin A1c     Status: Abnormal   Collection Time: 01/08/23 11:41 AM  Result Value Ref Range   Hgb A1c MFr Bld 6.6 (H) 4.8 - 5.6 %    Comment:          Prediabetes: 5.7 - 6.4          Diabetes: >6.4          Glycemic control for adults with diabetes: <7.0    Est. average glucose Bld gHb Est-mCnc 143 mg/dL  Ferritin     Status: Abnormal   Collection Time: 01/08/23 11:41 AM  Result Value Ref Range   Ferritin 213 (H) 15 - 150 ng/mL  POC Hemoccult Bld/Stl (3-Cd Home Screen)     Status: Abnormal   Collection Time: 02/02/23  3:17 PM  Result Value Ref Range   Card #1 Date  01-30-2023    Fecal Occult Blood, POC Positive (A) Negative   Card #2 Date 01-30-2023    Card #2 Fecal Occult Blod, POC Positive    Card #3 Date 01-30-2023    Card #3 Fecal Occult Blood, POC Positive       Assessment & Plan:  Patient advised to continue taking all her medications as such we will order a Cologuard.  And will try to get her on the wait list for colonoscopy.  Meanwhile she can start a probiotic and add fiber to her diet. Monitor blood pressure at home once. Problem List Items Addressed This Visit     Essential hypertension, benign   Iron deficiency anemia   Prediabetes   Symptom of blood in stool   Other Visit Diagnoses     Colon cancer screening    -  Primary   Relevant Orders   Cologuard       Follow up as scheduled.   Total time spent: 25 minutes  Margaretann Loveless, MD  02/19/2023   This document may have been prepared by [facility] Voice Recognition software and as such may include unintentional dictation errors.

## 2023-02-21 DIAGNOSIS — M545 Low back pain, unspecified: Secondary | ICD-10-CM | POA: Diagnosis not present

## 2023-02-27 DIAGNOSIS — M545 Low back pain, unspecified: Secondary | ICD-10-CM | POA: Diagnosis not present

## 2023-02-27 LAB — COLOGUARD

## 2023-03-01 DIAGNOSIS — M545 Low back pain, unspecified: Secondary | ICD-10-CM | POA: Diagnosis not present

## 2023-03-04 DIAGNOSIS — Z1211 Encounter for screening for malignant neoplasm of colon: Secondary | ICD-10-CM | POA: Diagnosis not present

## 2023-03-21 DIAGNOSIS — M545 Low back pain, unspecified: Secondary | ICD-10-CM | POA: Diagnosis not present

## 2023-04-02 DIAGNOSIS — M79669 Pain in unspecified lower leg: Secondary | ICD-10-CM | POA: Diagnosis not present

## 2023-04-02 DIAGNOSIS — R2681 Unsteadiness on feet: Secondary | ICD-10-CM | POA: Diagnosis not present

## 2023-04-02 DIAGNOSIS — M545 Low back pain, unspecified: Secondary | ICD-10-CM | POA: Diagnosis not present

## 2023-04-08 IMAGING — MG MM DIGITAL SCREENING BILAT W/ TOMO AND CAD
8 series · 8 of 24 positions shown · non-contrast
Comparison: Previous exam(s).

CLINICAL DATA: Screening. History of benign LEFT breast biopsy in
9090.

EXAM:
DIGITAL SCREENING BILATERAL MAMMOGRAM WITH TOMOSYNTHESIS AND CAD
TECHNIQUE: Bilateral screening digital craniocaudal and mediolateral oblique
mammograms were obtained. Bilateral screening digital breast
tomosynthesis was performed. The images were evaluated with
computer-aided detection.

[L MLO synth-2D]
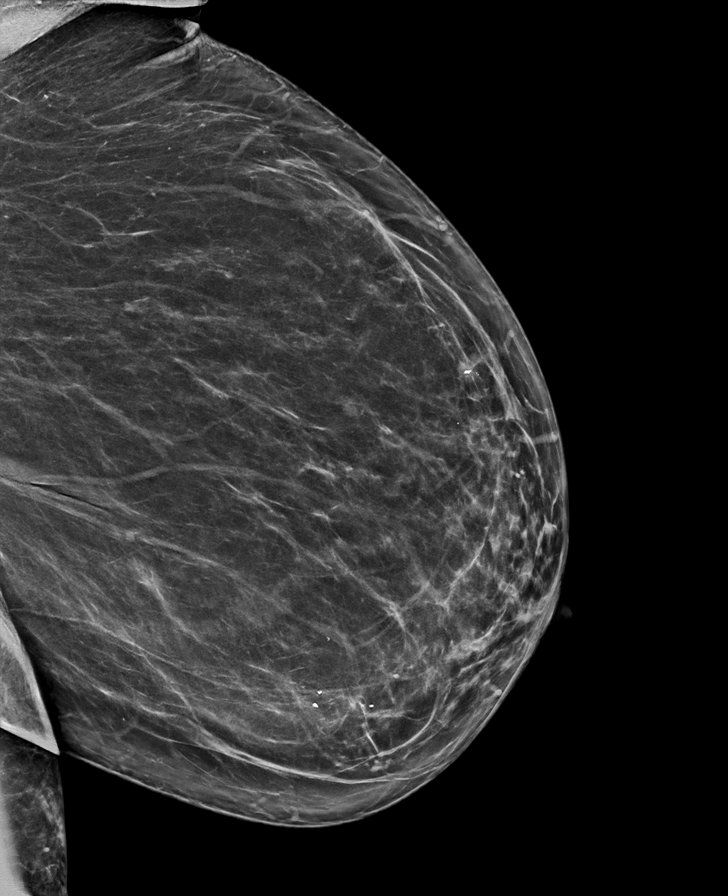

[R CC synth-2D]
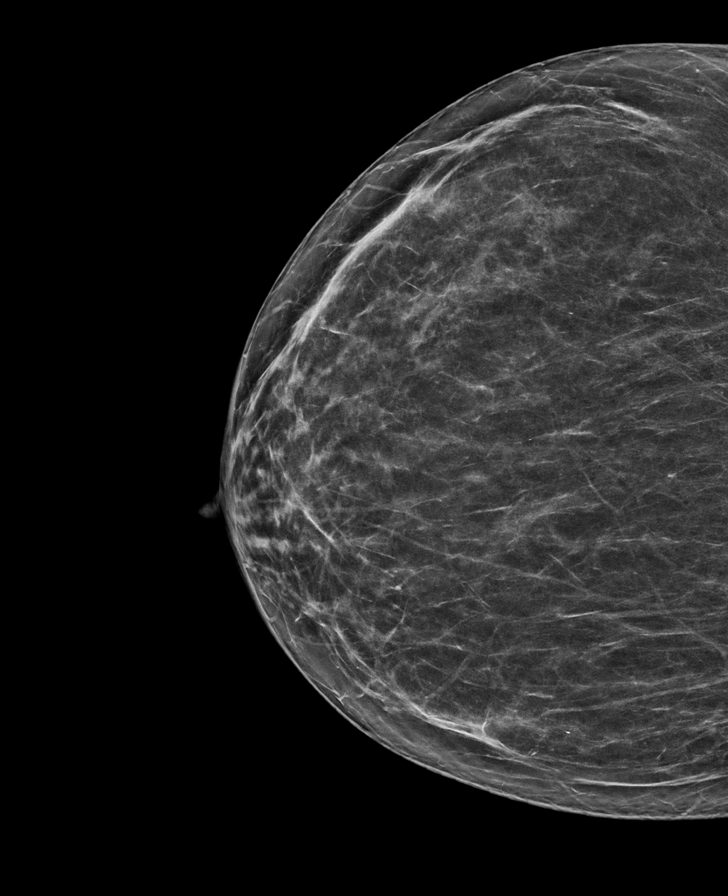

[L CC synth-2D]
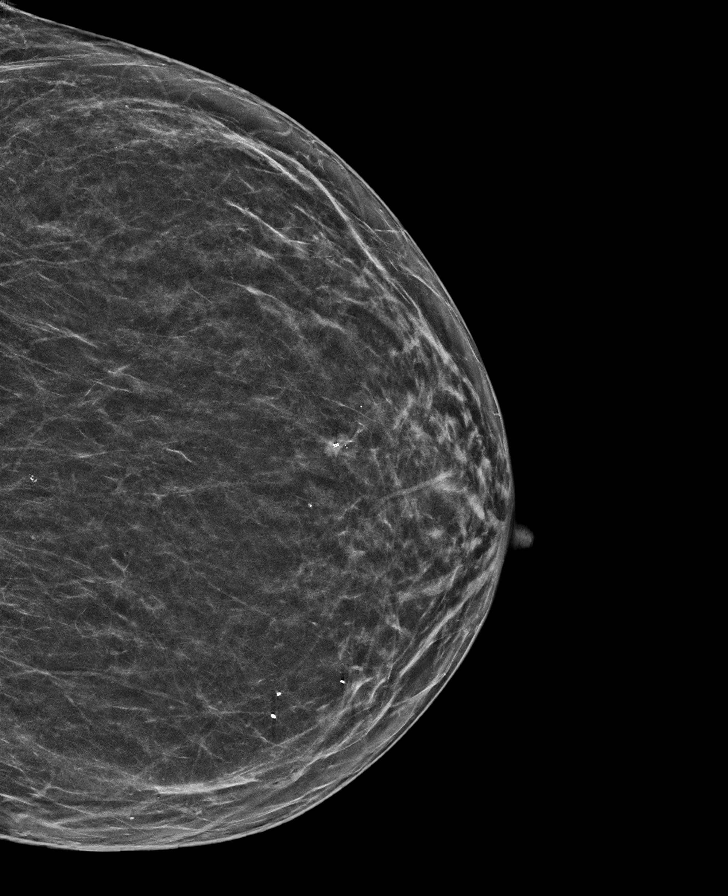

[R MLO synth-2D]
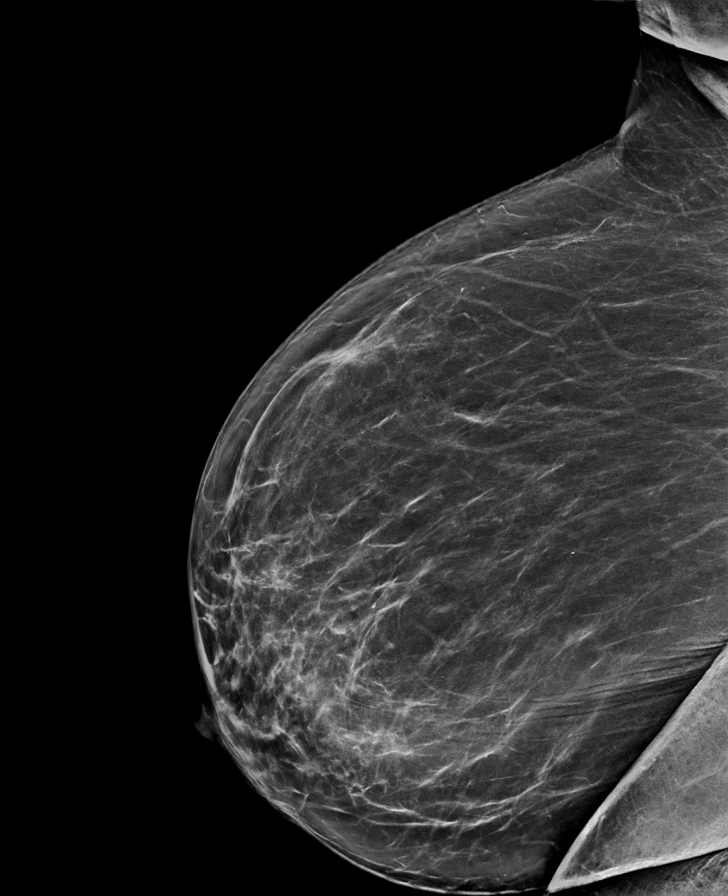

[L CC tomo · tomo slice 35/70.0]
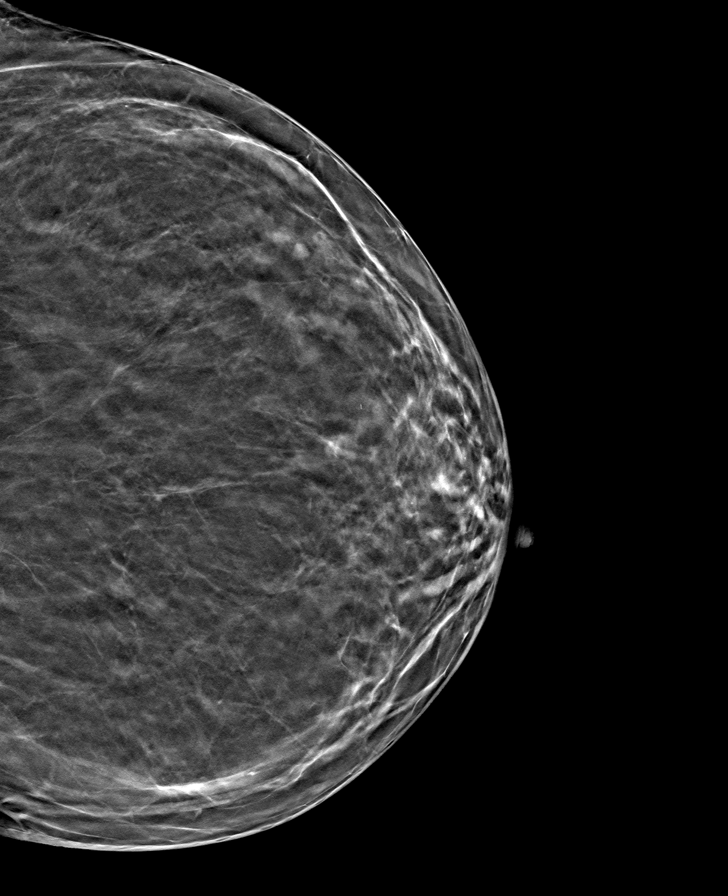

[L MLO tomo · tomo slice 40/79.0]
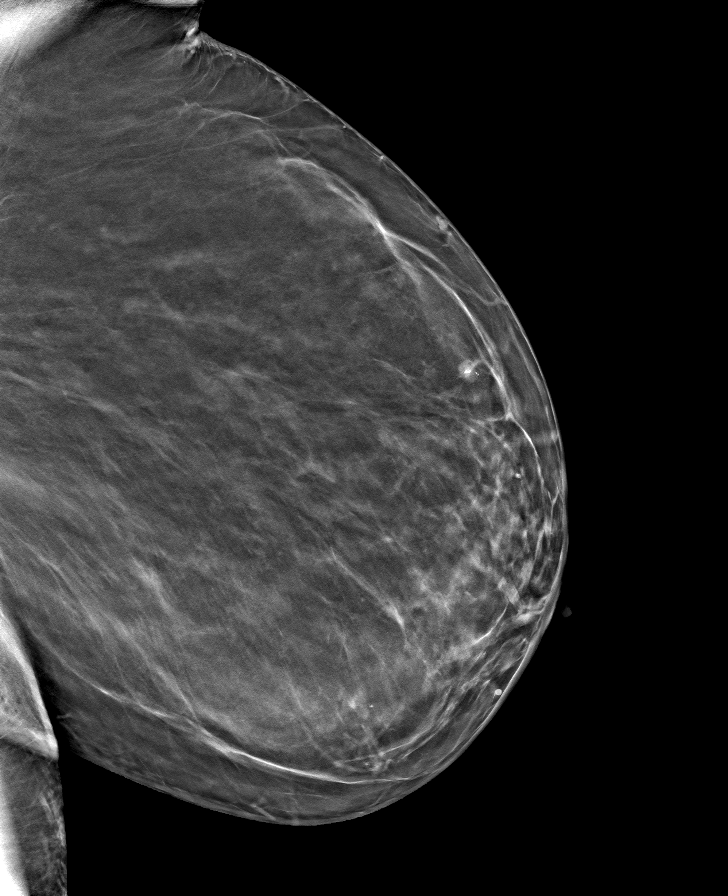

[R MLO tomo · tomo slice 44/87.0]
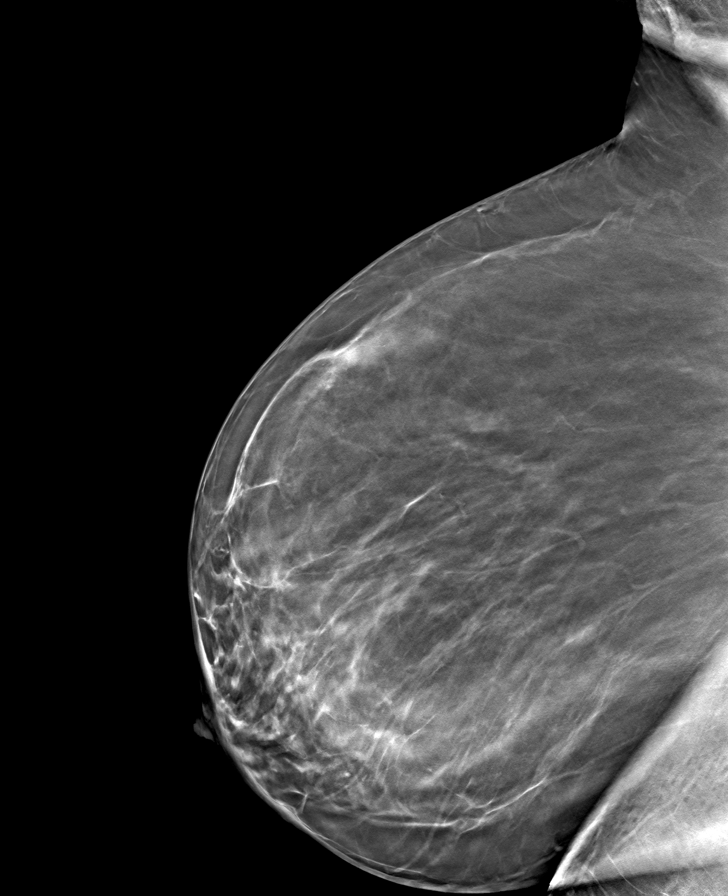

[R CC tomo · tomo slice 36/71.0]
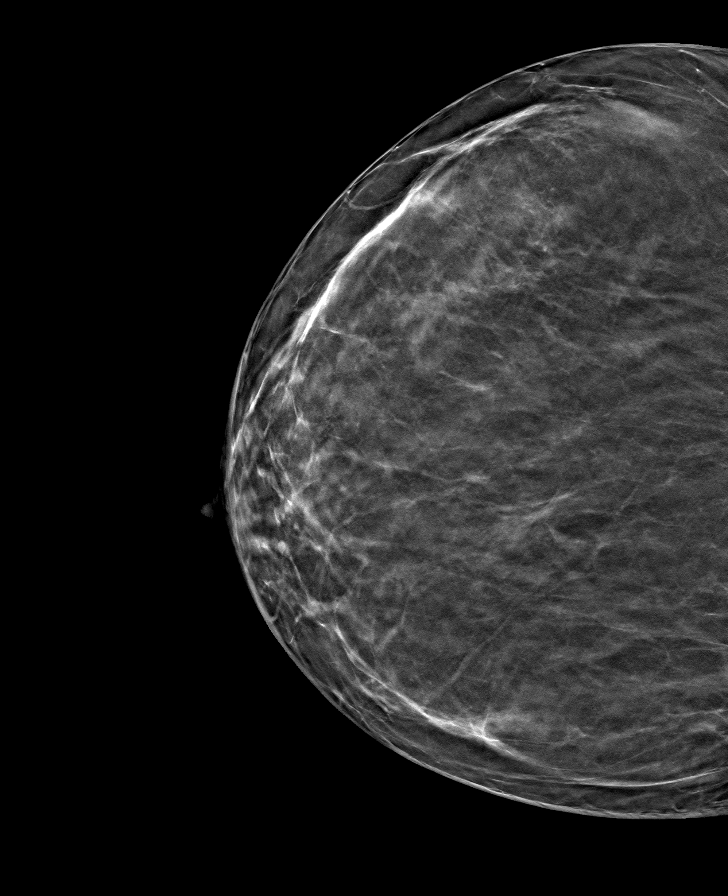

[8 of 24 positions shown; findings below may reference images not displayed]

ACR Breast Density Category b: There are scattered areas of
fibroglandular density.
FINDINGS: There are no findings suspicious for malignancy.
IMPRESSION: No mammographic evidence of malignancy. A result letter of this
screening mammogram will be mailed directly to the patient.

RECOMMENDATION:
Screening mammogram in one year. (Code:G0-V-2MQ)

BI-RADS CATEGORY  1: Negative.

## 2023-04-10 ENCOUNTER — Ambulatory Visit (INDEPENDENT_AMBULATORY_CARE_PROVIDER_SITE_OTHER): Payer: Medicare Other | Admitting: Internal Medicine

## 2023-04-10 ENCOUNTER — Encounter: Payer: Self-pay | Admitting: Internal Medicine

## 2023-04-10 VITALS — BP 140/92 | HR 57 | Ht 69.0 in | Wt 249.0 lb

## 2023-04-10 DIAGNOSIS — D508 Other iron deficiency anemias: Secondary | ICD-10-CM

## 2023-04-10 DIAGNOSIS — I10 Essential (primary) hypertension: Secondary | ICD-10-CM | POA: Diagnosis not present

## 2023-04-10 DIAGNOSIS — L304 Erythema intertrigo: Secondary | ICD-10-CM | POA: Diagnosis not present

## 2023-04-10 DIAGNOSIS — R7303 Prediabetes: Secondary | ICD-10-CM | POA: Diagnosis not present

## 2023-04-10 DIAGNOSIS — E782 Mixed hyperlipidemia: Secondary | ICD-10-CM

## 2023-04-10 MED ORDER — CLOTRIMAZOLE-BETAMETHASONE 1-0.05 % EX CREA
1.0000 | TOPICAL_CREAM | Freq: Two times a day (BID) | CUTANEOUS | 0 refills | Status: DC
Start: 2023-04-10 — End: 2023-04-12

## 2023-04-10 NOTE — Progress Notes (Signed)
Established Patient Office Visit  Subjective:  Patient ID: Diana Green, female    DOB: 1951/01/23  Age: 72 y.o. MRN: [id number]  Chief Complaint  Patient presents with   Follow-up    3 month follow up    Patient comes in for her regular follow-up today.  She is feeling better since her Cologuard result came back negative.  However she has history of anemia as well as heme positive stools.  She has personal history of colon polyps and is scheduled for colonoscopy next month.  She was worried about late colonoscopy so a Cologuard test was done . Today her blood pressure is better than last time but still above normal.  Patient reports that at home it runs within normal limits. She is fasting for blood work today. Mentions some itching in her gluteal fold, has tried using antibiotic cream.  On exam there is a mild rash, suspect intertrigo.  Will send in Lotrisone cream.    No other concerns at this time.   Past Medical History:  Diagnosis Date   Arthritis    Depression    Essential hypertension, benign 10/09/2022   GERD (gastroesophageal reflux disease)    occ no meds   High cholesterol    Hypertension    Impaired glucose tolerance 10/09/2022   Iron deficiency anemia 10/09/2022   Vitamin D deficiency 10/09/2022    Past Surgical History:  Procedure Laterality Date   ANTERIOR CERVICAL DECOMP/DISCECTOMY FUSION N/A 07/08/2021   Procedure: C3-4 ANTERIOR CERVICAL DECOMPRESSION/DISCECTOMY FUSION 1 LEVEL WITH HEDRON;  Surgeon: Venetia Night, MD;  Location: [facility];  Service: Neurosurgery;  Laterality: N/A;   APPLICATION OF INTRAOPERATIVE CT SCAN N/A 09/19/2021   Procedure: APPLICATION OF INTRAOPERATIVE CT SCAN;  Surgeon: Venetia Night, MD;  Location: [facility];  Service: Neurosurgery;  Laterality: N/A;   APPLICATION OF WOUND VAC  09/19/2021   Procedure: APPLICATION OF WOUND VAC;  Surgeon: Venetia Night, MD;  Location: [facility];  Service: Neurosurgery;;  [facility]   BACK  SURGERY     thoracic   CESAREAN SECTION  1971   CHOLECYSTECTOMY     COLONOSCOPY     ROTATOR CUFF REPAIR Right 2006   TOTAL KNEE ARTHROPLASTY Left 2014   TOTAL KNEE ARTHROPLASTY Right 09/11/2022   Procedure: TOTAL KNEE ARTHROPLASTY;  Surgeon: Reinaldo Berber, MD;  Location: [facility];  Service: Orthopedics;  Laterality: Right;   TUBAL LIGATION  1975    Social History   Socioeconomic History   Marital status: Married    Spouse name: Not on file   Number of children: Not on file   Years of education: Not on file   Highest education level: Not on file  Occupational History   Not on file  Tobacco Use   Smoking status: Never   Smokeless tobacco: Never  Vaping Use   Vaping status: Never Used  Substance and Sexual Activity   Alcohol use: Yes    Comment: rarely   Drug use: No   Sexual activity: Not on file  Other Topics Concern   Not on file  Social History Narrative   Not on file   Social Determinants of Health   Financial Resource Strain: Low Risk  (02/28/2021)   Received from [facility], [facility]   Overall Financial Resource Strain (CARDIA)    Difficulty of Paying Living Expenses: Not very hard  Food Insecurity: No Food Insecurity (09/11/2022)   Hunger Vital Sign    Worried About Running Out of  Food in the Last Year: Never true    Ran Out of Food in the Last Year: Never true  Transportation Needs: No Transportation Needs (09/11/2022)   PRAPARE - Administrator, Civil Service (Medical): No    Lack of Transportation (Non-Medical): No  Physical Activity: Not on file  Stress: Not on file  Social Connections: Not on file  Intimate Partner Violence: Not At Risk (09/11/2022)   Humiliation, Afraid, Rape, and Kick questionnaire    Fear of Current or Ex-Partner: No    Emotionally Abused: No    Physically Abused: No    Sexually Abused: No    History reviewed. No pertinent family history.  No Known Allergies  Review of Systems  Constitutional:  Negative.  Negative for chills, fever and weight loss.  HENT: Negative.  Negative for congestion, sinus pain and sore throat.   Eyes: Negative.   Respiratory: Negative.  Negative for cough and shortness of breath.   Cardiovascular: Negative.  Negative for chest pain, palpitations and leg swelling.  Gastrointestinal: Negative.  Negative for abdominal pain, blood in stool, constipation, diarrhea, heartburn, melena, nausea and vomiting.  Genitourinary: Negative.  Negative for dysuria and flank pain.  Musculoskeletal: Negative.  Negative for joint pain and myalgias.  Skin:  Positive for itching and rash.  Neurological: Negative.  Negative for dizziness and headaches.  Endo/Heme/Allergies: Negative.   Psychiatric/Behavioral: Negative.  Negative for depression and suicidal ideas. The patient is not nervous/anxious.        Objective:   BP (!) 140/92   Pulse (!) 57   Ht 5\' 9"  (1.753 m)   Wt 249 lb (112.9 kg)   SpO2 93%   BMI 36.77 kg/m   Vitals:   04/10/23 1124  BP: (!) 140/92  Pulse: (!) 57  Height: 5\' 9"  (1.753 m)  Weight: 249 lb (112.9 kg)  SpO2: 93%  BMI (Calculated): 36.75    Physical Exam Vitals and nursing note reviewed.  Constitutional:      Appearance: Normal appearance.  HENT:     Head: Normocephalic and atraumatic.     Nose: Nose normal.     Mouth/Throat:     Mouth: Mucous membranes are moist.     Pharynx: Oropharynx is clear.  Eyes:     Conjunctiva/sclera: Conjunctivae normal.     Pupils: Pupils are equal, round, and reactive to light.  Cardiovascular:     Rate and Rhythm: Normal rate and regular rhythm.     Pulses: Normal pulses.     Heart sounds: Normal heart sounds. No murmur heard. Pulmonary:     Effort: Pulmonary effort is normal.     Breath sounds: Normal breath sounds. No wheezing.  Abdominal:     General: Bowel sounds are normal.     Palpations: Abdomen is soft.     Tenderness: There is no abdominal tenderness. There is no right CVA tenderness or  left CVA tenderness.  Musculoskeletal:        General: Normal range of motion.     Cervical back: Normal range of motion.     Right lower leg: No edema.     Left lower leg: No edema.  Skin:    General: Skin is warm and dry.  Neurological:     General: No focal deficit present.     Mental Status: She is alert and oriented to person, place, and time.  Psychiatric:        Mood and Affect: Mood normal.  Behavior: Behavior normal.      No results found for any visits on 04/10/23.  Recent Results (from the past 2160 hour(s))  POC Hemoccult Bld/Stl (3-Cd Home Screen)     Status: Abnormal   Collection Time: 02/02/23  3:17 PM  Result Value Ref Range   Card #1 Date 01-30-2023    Fecal Occult Blood, POC Positive (A) Negative   Card #2 Date 01-30-2023    Card #2 Fecal Occult Blod, POC Positive    Card #3 Date 01-30-2023    Card #3 Fecal Occult Blood, POC Positive   Cologuard     Status: None   Collection Time: 02/27/23  7:13 PM  Result Value Ref Range   COLOGUARD Sample Could Not Be Processed 3 N/A    Comment: The stool exceeds the allowable weight (300 grams). The patient will be contacted to initiate a new sample collection.  Cologuard     Status: None   Collection Time: 03/04/23 12:00 PM  Result Value Ref Range   COLOGUARD Negative Negative    Comment:  NEGATIVE TEST RESULT. A negative Cologuard result indicates a low likelihood that a colorectal cancer (CRC) or advanced adenoma (adenomatous polyps with more advanced pre-malignant features)  is present. The chance that a person with a negative Cologuard test has a colorectal cancer is less than 1 in 1500 (negative predictive value >99.9%) or has an  advanced adenoma is less than  5.3% (negative predictive value 94.7%). These data are based on a prospective cross-sectional study of 10,000 individuals at average risk for colorectal cancer who were screened with both Cologuard and colonoscopy. (Imperiale T. et al, N Engl J Med  2014;370(14):1286-1297) The normal value (reference range) for this assay is negative.  COLOGUARD RE-SCREENING RECOMMENDATION: Periodic colorectal cancer screening is an important part of preventive healthcare for asymptomatic individuals at average risk for colorectal cancer.  Following a negative Cologuard result, the American Cancer Society and U.S.  Multi-Society Task Force screening guidelines recommend a Cologuard re-screening interval of 3 years.  References: American Cancer Society Guideline for Colorectal Cancer Screening: https://www.cancer.org/cancer/colon-rectal-cancer/detection-diagnosis-staging/acs-recommendations.html.; Rex DK, Boland CR, Dominitz JK, Colorectal Cancer Screening: Recommendations for Physicians and Patients from the U.S. Multi-Society Task Force on Colorectal Cancer Screening , Am J Gastroenterology 2017; 112:1016-1030.  TEST DESCRIPTION: Composite algorithmic analysis of stool DNA-biomarkers with hemoglobin immunoassay.   Quantitative values of individual biomarkers are not reportable and are not associated with individual biomarker result reference ranges. Cologuard is intended for colorectal cancer screening of adults of either sex, 45 years or older, who are at average-risk for colorectal cancer (CRC). Cologuard has been approved for use by the U.S. FDA. The performance of Cologuard was  established in a cross sectional study of average-risk adults aged 67-84. Cologuard performance in patients ages 90 to 48 years was estimated by sub-group analysis of near-age groups. Colonoscopies performed for a positive result may find as the most clinically significant lesion: colorectal cancer [4.0%], advanced adenoma (including sessile serrated polyps greater than or equal to 1cm diameter) [20%] or non- advanced adenoma [31%]; or no colorectal neoplasia [45%]. These estimates are derived from a prospective cross-sectional screening study of 10,000 individuals at average risk for  colorectal cancer who were screened with both Cologuard and colonoscopy. (Imperiale T. et al, Macy Mis J Med 2014;370(14):1286-1297.) Cologuard may produce a false negative or false positive result (no colorectal cancer or precancerous polyp present at colonoscopy follow up). A negative Cologuard test result does not guarantee the absence of CRC  or advanced adenoma (pre-cancer). The current Cologuard  screening interval is every 3 years. Science writer and [place] Therapist, music). Cologuard performance data in a 10,000 patient pivotal study using colonoscopy as the reference method can be accessed at the following location: www.exactlabs.com/results. Additional description of the Cologuard test process, warnings and precautions can be found at www.cologuard.com.       Assessment & Plan:  Continue medications.  Monitor blood pressure at home. Problem List Items Addressed This Visit     Mixed hyperlipidemia   Relevant Orders   Lipid Panel w/o Chol/HDL Ratio   Essential hypertension, benign - Primary   Relevant Orders   CMP14+EGFR   Iron deficiency anemia   Relevant Orders   CBC with Diff   Prediabetes   Relevant Orders   Hemoglobin A1c   Other Visit Diagnoses     Intertrigo       Relevant Medications   clotrimazole-betamethasone (LOTRISONE) cream       Return in about 3 months (around 07/11/2023).   Total time spent: 30 minutes  Margaretann Loveless, MD  04/10/2023   This document may have been prepared by [facility] Voice Recognition software and as such may include unintentional dictation errors.

## 2023-04-11 LAB — CMP14+EGFR
ALT: 23 IU/L (ref 0–32)
AST: 35 IU/L (ref 0–40)
Albumin: 4.4 g/dL (ref 3.8–4.8)
Alkaline Phosphatase: 85 IU/L (ref 44–121)
BUN/Creatinine Ratio: 14 (ref 12–28)
BUN: 15 mg/dL (ref 8–27)
Bilirubin Total: 0.3 mg/dL (ref 0.0–1.2)
CO2: 27 mmol/L (ref 20–29)
Calcium: 10.2 mg/dL (ref 8.7–10.3)
Chloride: 104 mmol/L (ref 96–106)
Creatinine, Ser: 1.09 mg/dL — ABNORMAL HIGH (ref 0.57–1.00)
Globulin, Total: 2.9 g/dL (ref 1.5–4.5)
Glucose: 89 mg/dL (ref 70–99)
Potassium: 4.8 mmol/L (ref 3.5–5.2)
Sodium: 142 mmol/L (ref 134–144)
Total Protein: 7.3 g/dL (ref 6.0–8.5)
eGFR: 54 mL/min/{1.73_m2} — ABNORMAL LOW (ref >59)

## 2023-04-11 LAB — CBC WITH DIFFERENTIAL/PLATELET
Basophils Absolute: 0 10*3/uL (ref 0.0–0.2)
Basos: 1 %
EOS (ABSOLUTE): 0.3 10*3/uL (ref 0.0–0.4)
Eos: 5 %
Hematocrit: 34.7 % (ref 34.0–46.6)
Hemoglobin: 11.1 g/dL (ref 11.1–15.9)
Immature Grans (Abs): 0 10*3/uL (ref 0.0–0.1)
Immature Granulocytes: 0 %
Lymphocytes Absolute: 1.3 10*3/uL (ref 0.7–3.1)
Lymphs: 26 %
MCH: 29 pg (ref 26.6–33.0)
MCHC: 32 g/dL (ref 31.5–35.7)
MCV: 91 fL (ref 79–97)
Monocytes Absolute: 0.4 10*3/uL (ref 0.1–0.9)
Monocytes: 8 %
Neutrophils Absolute: 3 10*3/uL (ref 1.4–7.0)
Neutrophils: 60 %
Platelets: 244 10*3/uL (ref 150–450)
RBC: 3.83 x10E6/uL (ref 3.77–5.28)
RDW: 13.5 % (ref 11.7–15.4)
WBC: 5.1 10*3/uL (ref 3.4–10.8)

## 2023-04-11 LAB — LIPID PANEL W/O CHOL/HDL RATIO
Cholesterol, Total: 231 mg/dL — ABNORMAL HIGH (ref 100–199)
HDL: 62 mg/dL (ref >39)
LDL Chol Calc (NIH): 139 mg/dL — ABNORMAL HIGH (ref 0–99)
Triglycerides: 171 mg/dL — ABNORMAL HIGH (ref 0–149)
VLDL Cholesterol Cal: 30 mg/dL (ref 5–40)

## 2023-04-11 LAB — HEMOGLOBIN A1C
Est. average glucose Bld gHb Est-mCnc: 123 mg/dL
Hgb A1c MFr Bld: 5.9 % — ABNORMAL HIGH (ref 4.8–5.6)

## 2023-04-12 ENCOUNTER — Other Ambulatory Visit: Payer: Self-pay

## 2023-04-12 DIAGNOSIS — L304 Erythema intertrigo: Secondary | ICD-10-CM

## 2023-04-12 DIAGNOSIS — M545 Low back pain, unspecified: Secondary | ICD-10-CM | POA: Diagnosis not present

## 2023-04-12 DIAGNOSIS — M79669 Pain in unspecified lower leg: Secondary | ICD-10-CM | POA: Diagnosis not present

## 2023-04-12 DIAGNOSIS — R2681 Unsteadiness on feet: Secondary | ICD-10-CM | POA: Diagnosis not present

## 2023-04-12 MED ORDER — CLOTRIMAZOLE-BETAMETHASONE 1-0.05 % EX CREA
1.0000 | TOPICAL_CREAM | Freq: Two times a day (BID) | CUTANEOUS | 0 refills | Status: DC
Start: 2023-04-12 — End: 2023-07-12

## 2023-04-12 MED ORDER — ROSUVASTATIN CALCIUM 20 MG PO TABS: 20.0000 mg | ORAL_TABLET | Freq: Every day | ORAL | 3 refills | Status: DC

## 2023-04-12 NOTE — Progress Notes (Signed)
Spoke with pt who verified DOB and verbalized understanding. Order has been sent to pt preferred pharmacy at this time.

## 2023-04-12 NOTE — Progress Notes (Signed)
Spoke with pt who verbalized understanding.

## 2023-04-16 ENCOUNTER — Ambulatory Visit: Payer: Medicare Other | Admitting: Podiatry

## 2023-04-18 DIAGNOSIS — M545 Low back pain, unspecified: Secondary | ICD-10-CM | POA: Diagnosis not present

## 2023-04-18 DIAGNOSIS — M79669 Pain in unspecified lower leg: Secondary | ICD-10-CM | POA: Diagnosis not present

## 2023-04-18 DIAGNOSIS — R2681 Unsteadiness on feet: Secondary | ICD-10-CM | POA: Diagnosis not present

## 2023-04-25 DIAGNOSIS — T8484XA Pain due to internal orthopedic prosthetic devices, implants and grafts, initial encounter: Secondary | ICD-10-CM | POA: Diagnosis not present

## 2023-04-25 DIAGNOSIS — M170 Bilateral primary osteoarthritis of knee: Secondary | ICD-10-CM | POA: Diagnosis not present

## 2023-04-25 DIAGNOSIS — Z96653 Presence of artificial knee joint, bilateral: Secondary | ICD-10-CM | POA: Diagnosis not present

## 2023-04-26 DIAGNOSIS — M545 Low back pain, unspecified: Secondary | ICD-10-CM | POA: Diagnosis not present

## 2023-04-26 DIAGNOSIS — R2681 Unsteadiness on feet: Secondary | ICD-10-CM | POA: Diagnosis not present

## 2023-04-26 DIAGNOSIS — M79669 Pain in unspecified lower leg: Secondary | ICD-10-CM | POA: Diagnosis not present

## 2023-04-27 ENCOUNTER — Telehealth: Payer: Self-pay | Admitting: Internal Medicine

## 2023-04-27 NOTE — Telephone Encounter (Signed)
Patient left VM wanting to know what meds that Dr. Welton Flakes sent in?

## 2023-04-27 NOTE — Telephone Encounter (Signed)
Notified pt that based off of pt medication list nothing has been sent in for her and pt was advise to reach out to pharmacy to get clarification on what medication is not in stock.

## 2023-05-01 ENCOUNTER — Ambulatory Visit: Payer: Medicare Other | Admitting: Gastroenterology

## 2023-05-04 DIAGNOSIS — M545 Low back pain, unspecified: Secondary | ICD-10-CM | POA: Diagnosis not present

## 2023-05-04 DIAGNOSIS — R2681 Unsteadiness on feet: Secondary | ICD-10-CM | POA: Diagnosis not present

## 2023-05-04 DIAGNOSIS — M79669 Pain in unspecified lower leg: Secondary | ICD-10-CM | POA: Diagnosis not present

## 2023-05-07 DIAGNOSIS — R2681 Unsteadiness on feet: Secondary | ICD-10-CM | POA: Diagnosis not present

## 2023-05-07 DIAGNOSIS — M545 Low back pain, unspecified: Secondary | ICD-10-CM | POA: Diagnosis not present

## 2023-05-07 DIAGNOSIS — M79669 Pain in unspecified lower leg: Secondary | ICD-10-CM | POA: Diagnosis not present

## 2023-05-14 ENCOUNTER — Telehealth: Payer: Self-pay

## 2023-05-14 NOTE — Telephone Encounter (Signed)
Patient notified and verbalized understanding. 

## 2023-05-16 DIAGNOSIS — M545 Low back pain, unspecified: Secondary | ICD-10-CM | POA: Diagnosis not present

## 2023-05-16 DIAGNOSIS — M79669 Pain in unspecified lower leg: Secondary | ICD-10-CM | POA: Diagnosis not present

## 2023-05-16 DIAGNOSIS — R2681 Unsteadiness on feet: Secondary | ICD-10-CM | POA: Diagnosis not present

## 2023-05-23 DIAGNOSIS — M545 Low back pain, unspecified: Secondary | ICD-10-CM | POA: Diagnosis not present

## 2023-05-23 DIAGNOSIS — R2681 Unsteadiness on feet: Secondary | ICD-10-CM | POA: Diagnosis not present

## 2023-05-23 DIAGNOSIS — M79669 Pain in unspecified lower leg: Secondary | ICD-10-CM | POA: Diagnosis not present

## 2023-05-28 DIAGNOSIS — M545 Low back pain, unspecified: Secondary | ICD-10-CM | POA: Diagnosis not present

## 2023-05-28 DIAGNOSIS — R2681 Unsteadiness on feet: Secondary | ICD-10-CM | POA: Diagnosis not present

## 2023-05-28 DIAGNOSIS — M79669 Pain in unspecified lower leg: Secondary | ICD-10-CM | POA: Diagnosis not present

## 2023-06-05 ENCOUNTER — Ambulatory Visit: Payer: Medicare Other | Admitting: Podiatry

## 2023-06-05 DIAGNOSIS — M7752 Other enthesopathy of left foot: Secondary | ICD-10-CM | POA: Diagnosis not present

## 2023-06-05 NOTE — Progress Notes (Signed)
Subjective:  Patient ID: Diana Green, female    DOB: 22-Jun-1951,  MRN: [id number]  No chief complaint on file.   72 y.o. female presents with the above complaint.  Patient presents with follow-up of left ankle pain.  Patient states the injection helped considerably.  She still has some residual pain denies any other acute complaints.  Review of Systems: Negative except as noted in the HPI. Denies N/V/F/Ch.  Past Medical History:  Diagnosis Date   Arthritis    Depression    Essential hypertension, benign 10/09/2022   GERD (gastroesophageal reflux disease)    occ no meds   High cholesterol    Hypertension    Impaired glucose tolerance 10/09/2022   Iron deficiency anemia 10/09/2022   Vitamin D deficiency 10/09/2022    Current Outpatient Medications:    acetaminophen (TYLENOL) 500 MG tablet, Take 1,000 mg by mouth 3 (three) times daily., Disp: , Rfl:    atenolol (TENORMIN) 100 MG tablet, Take 1 tablet (100 mg total) by mouth in the morning., Disp: 90 tablet, Rfl: 3   clotrimazole-betamethasone (LOTRISONE) cream, Apply 1 Application topically 2 (two) times daily., Disp: 45 g, Rfl: 0   DULoxetine (CYMBALTA) 60 MG capsule, Take 1 capsule (60 mg total) by mouth in the morning., Disp: 90 capsule, Rfl: 3   Fe Fum-FA-B Cmp-C-Zn-Mg-Mn-Cu (HEMOCYTE-PLUS) 106-1 MG TABS, Take 1 tablet by mouth daily., Disp: 90 tablet, Rfl: 3   gabapentin (NEURONTIN) 600 MG tablet, Take 1 tablet (600 mg total) by mouth 2 (two) times daily., Disp: 180 tablet, Rfl: 3   hydrochlorothiazide (HYDRODIURIL) 25 MG tablet, Take 1 tablet (25 mg total) by mouth in the morning., Disp: 90 tablet, Rfl: 3   Lidocaine 4 % PTCH, Place 1 patch onto the skin daily as needed (back pain)., Disp: , Rfl:    losartan (COZAAR) 100 MG tablet, Take 1 tablet (100 mg total) by mouth at bedtime., Disp: 90 tablet, Rfl: 3   rosuvastatin (CRESTOR) 20 MG tablet, Take 1 tablet (20 mg total) by mouth daily., Disp: 90 tablet, Rfl: 3   senna  (SENOKOT) 8.6 MG TABS tablet, Take 1 tablet (8.6 mg total) by mouth 2 (two) times daily. (Patient taking differently: Take 1 tablet by mouth at bedtime.), Disp: 60 tablet, Rfl: 0   simvastatin (ZOCOR) 20 MG tablet, Take 1 tablet (20 mg total) by mouth at bedtime., Disp: 90 tablet, Rfl: 3  Social History   Tobacco Use  Smoking Status Never  Smokeless Tobacco Never    No Known Allergies Objective:  There were no vitals filed for this visit. There is no height or weight on file to calculate BMI. Constitutional Well developed. Well nourished.  Vascular Dorsalis pedis pulses palpable bilaterally. Posterior tibial pulses palpable bilaterally. Capillary refill normal to all digits.  No cyanosis or clubbing noted. Pedal hair growth normal.  Neurologic Normal speech. Oriented to person, place, and time. Epicritic sensation to light touch grossly present bilaterally.  Dermatologic Nails well groomed and normal in appearance. No open wounds. No skin lesions.  Orthopedic: Pain on palpation to the left ankle joint pain at the medial gutter.  Pain with range of motion of the joint deep intra-articular pain noted.  No other bony abnormalities noted.  No pain at the Achilles tendon peroneal tendon ATFL ligament   Radiographs: None Assessment:   No diagnosis found.  Plan:  Patient was evaluated and treated and all questions answered.  Left ankle capsulitis -All questions and concerns were discussed with the  patient in extensive detail given the amount of pain that she is having she will benefit from a steroid injection to help decrease acute inflammatory component associate with pain.  Patient agrees with plan we will proceed with steroid injection -A second steroid injection was performed at left ankle using 1% plain Lidocaine and 10 mg of Kenalog. This was well tolerated.   No follow-ups on file.

## 2023-06-14 DIAGNOSIS — Z862 Personal history of diseases of the blood and blood-forming organs and certain disorders involving the immune mechanism: Secondary | ICD-10-CM | POA: Diagnosis not present

## 2023-06-14 DIAGNOSIS — R195 Other fecal abnormalities: Secondary | ICD-10-CM | POA: Diagnosis not present

## 2023-06-14 DIAGNOSIS — Z860109 Personal history of other colon polyps: Secondary | ICD-10-CM | POA: Diagnosis not present

## 2023-06-20 DIAGNOSIS — M79669 Pain in unspecified lower leg: Secondary | ICD-10-CM | POA: Diagnosis not present

## 2023-06-20 DIAGNOSIS — R2681 Unsteadiness on feet: Secondary | ICD-10-CM | POA: Diagnosis not present

## 2023-06-20 DIAGNOSIS — M545 Low back pain, unspecified: Secondary | ICD-10-CM | POA: Diagnosis not present

## 2023-06-23 ENCOUNTER — Other Ambulatory Visit: Payer: Self-pay | Admitting: Internal Medicine

## 2023-06-29 ENCOUNTER — Telehealth: Payer: Self-pay | Admitting: Internal Medicine

## 2023-06-29 ENCOUNTER — Other Ambulatory Visit: Payer: Self-pay | Admitting: Internal Medicine

## 2023-06-29 DIAGNOSIS — Z1231 Encounter for screening mammogram for malignant neoplasm of breast: Secondary | ICD-10-CM

## 2023-06-29 IMAGING — CT DG THORACIC SPINE 2V
3 series · 16 of 33 positions shown, 19 images · non-contrast
Comparison: CT thoracic spine done on 05/24/2021

CLINICAL DATA: Fluoroscopic assistance for fusion in the lower
thoracic spine

EXAM:
THORACIC SPINE 2 VIEWS

[Series 7: — · axial · 0.36mm/px · z∈[-77,+77]mm · 8 of 170 slices shown, 10 images (1 of 3)]
[im 14/170  soft-tissue]
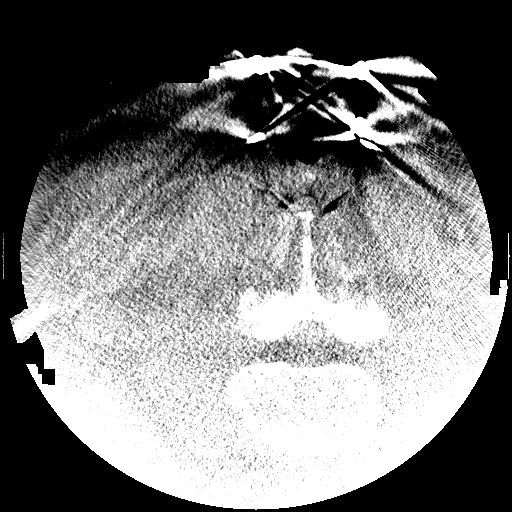
[im 14/170  bone]
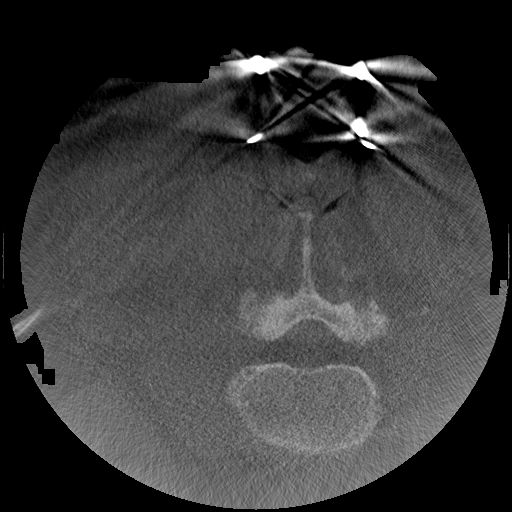
[im 40/170  bone]
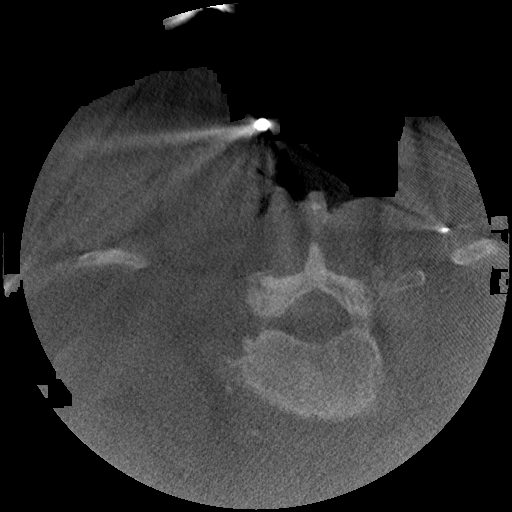
[im 53/170  bone]
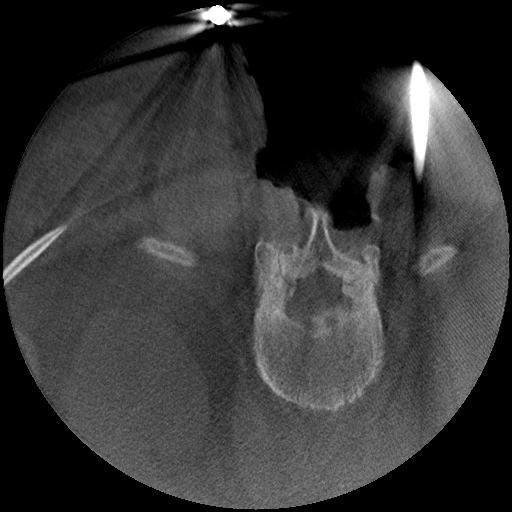
[im 79/170  bone]
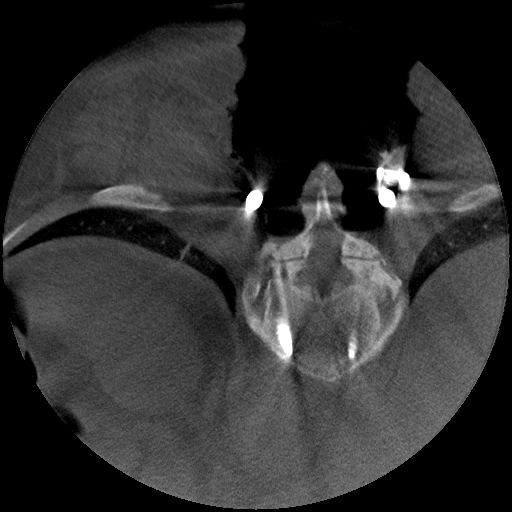
[im 92/170  soft-tissue]
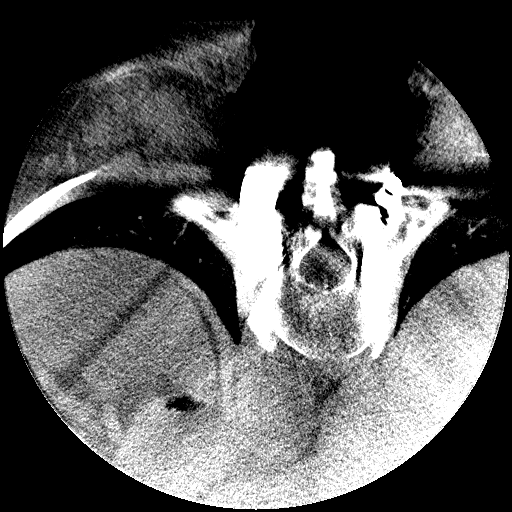
[im 92/170  bone]
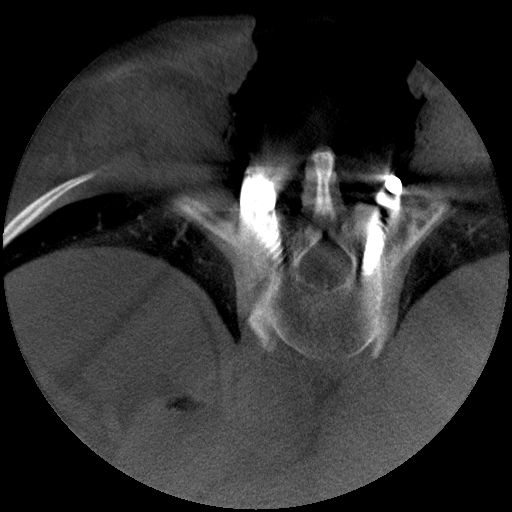
[im 118/170  bone]
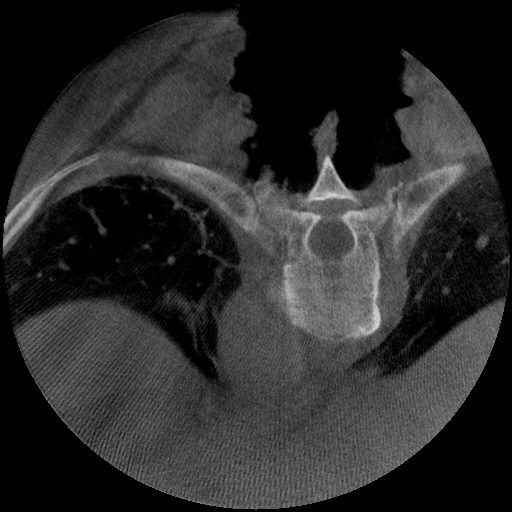
[im 131/170  bone]
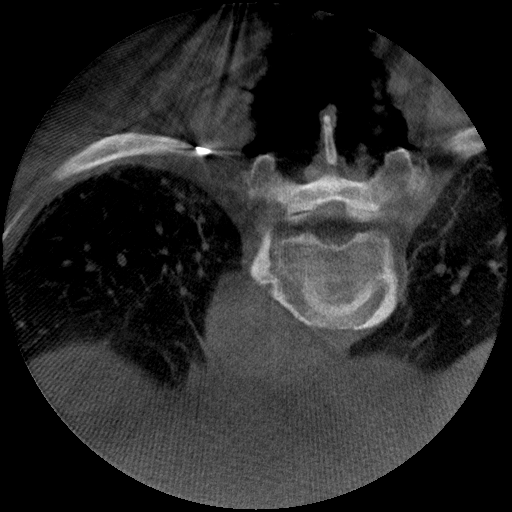
[im 157/170  bone]
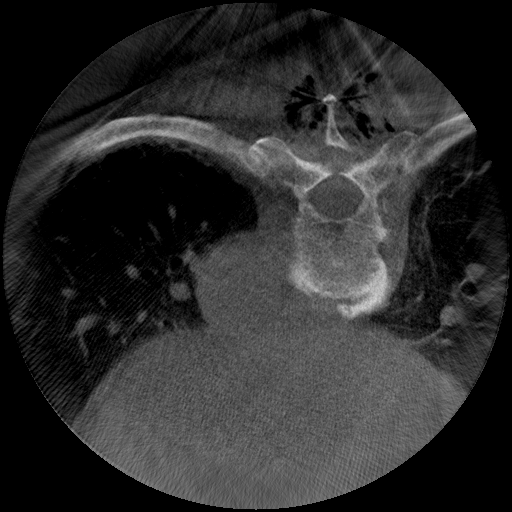

[Series 7: — · sagittal · 0.36mm/px · 5 of 145 slices shown, 6 images (2 of 3)]
[im 49/145  bone]
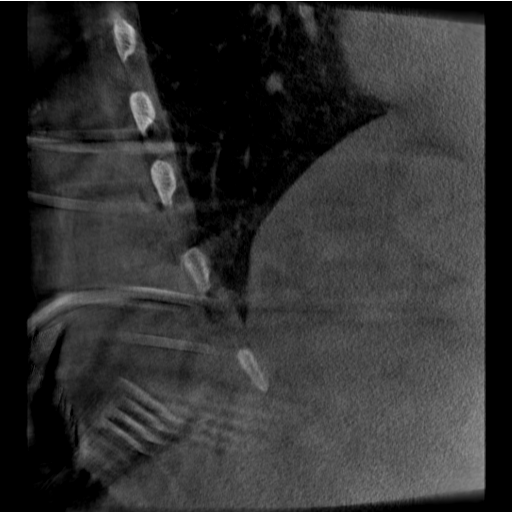
[im 61/145  bone]
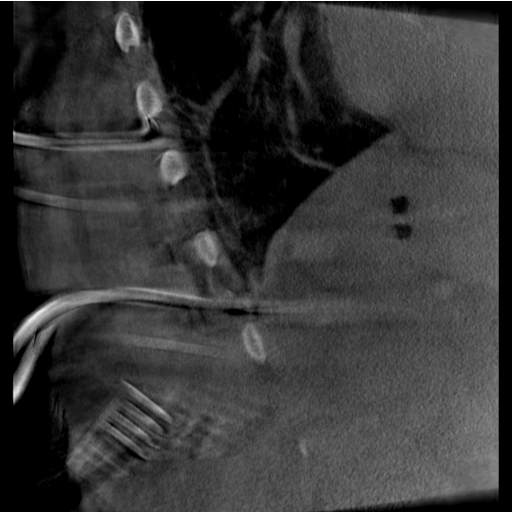
[im 73/145  soft-tissue]
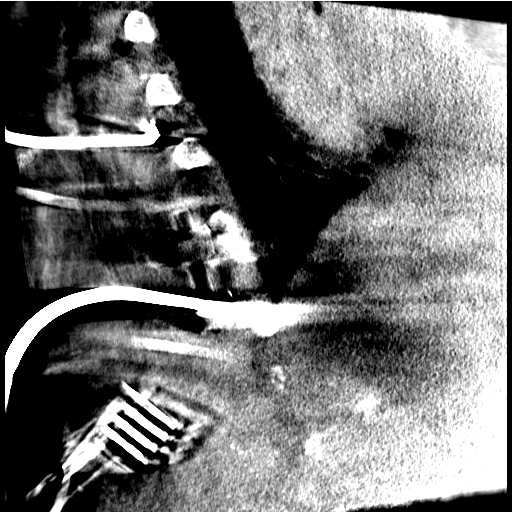
[im 73/145  bone]
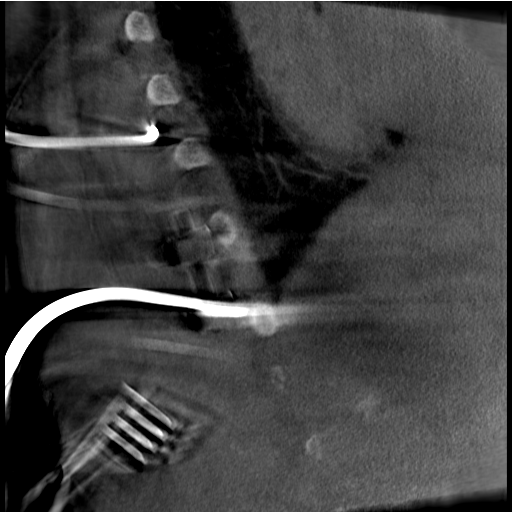
[im 85/145  bone]
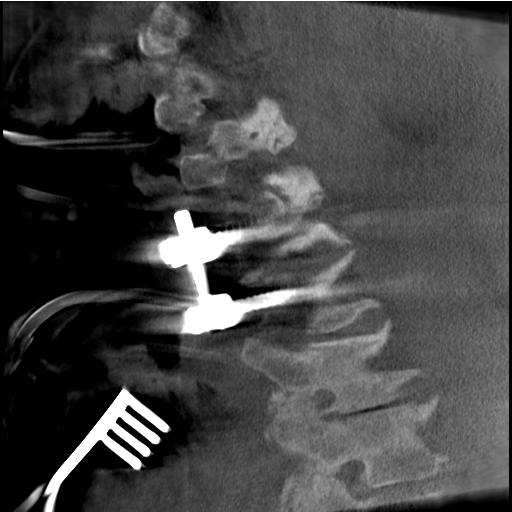
[im 97/145  bone]
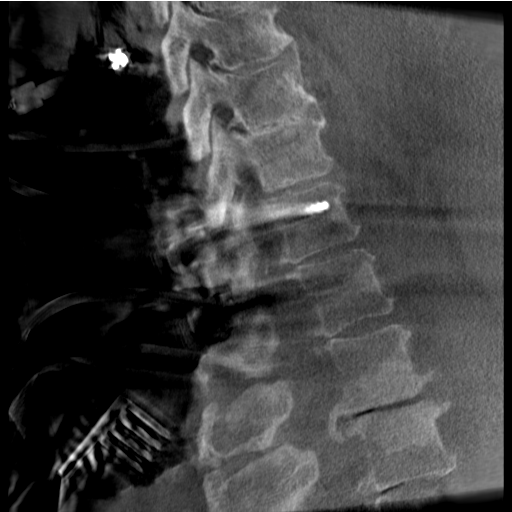

[Series 7: — · coronal · 0.36mm/px · 3 of 125 slices shown (3 of 3)]
[im 25/125  bone]
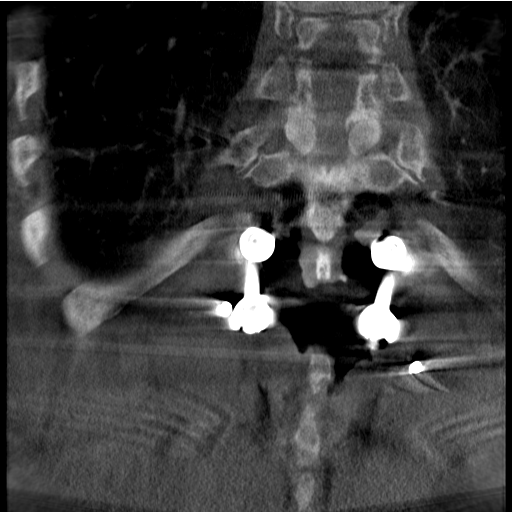
[im 50/125  bone]
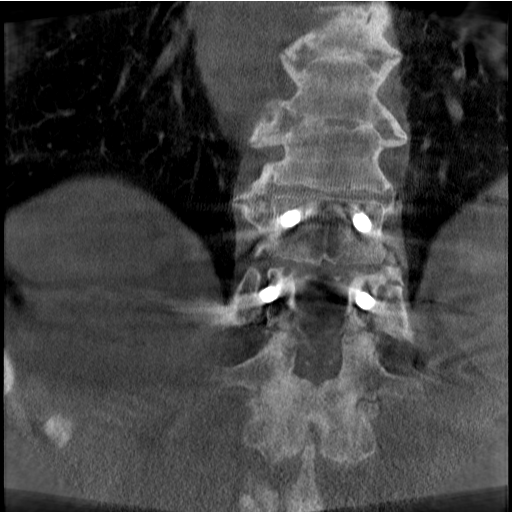
[im 75/125  bone]
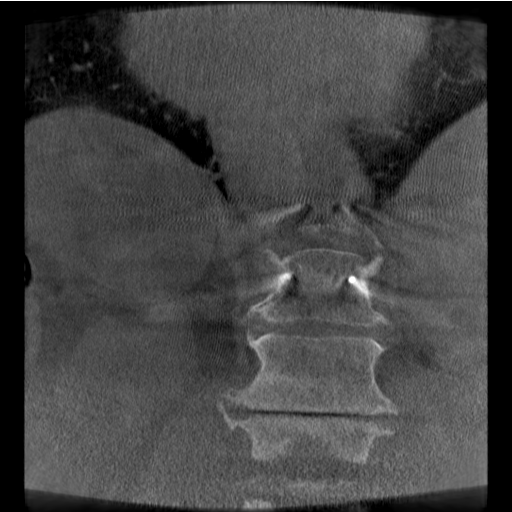

[16 of 33 positions shown; findings below may reference images not displayed]

FINDINGS: Fluoroscopic images show posterior surgical fusion at T11-T12 level.
Fluoroscopic time was 65 seconds. Estimated radiation dose is
mGy.
IMPRESSION: Fluoroscopic assistance was provided for surgical fusion at T11-T12
level.

## 2023-06-29 NOTE — Telephone Encounter (Signed)
Patient called in asking that Dr. Welton Flakes send an order for her mammogram. Please advise.

## 2023-07-09 ENCOUNTER — Telehealth: Payer: Self-pay | Admitting: Internal Medicine

## 2023-07-09 NOTE — Telephone Encounter (Signed)
Patient left VM that she is having pain in the left side and wants to move her appt for Thursday up sooner. Can you call and offer a sooner appt please?

## 2023-07-12 ENCOUNTER — Ambulatory Visit (INDEPENDENT_AMBULATORY_CARE_PROVIDER_SITE_OTHER): Payer: Medicare Other | Admitting: Internal Medicine

## 2023-07-12 ENCOUNTER — Encounter: Payer: Self-pay | Admitting: Internal Medicine

## 2023-07-12 ENCOUNTER — Other Ambulatory Visit: Payer: Self-pay | Admitting: Internal Medicine

## 2023-07-12 VITALS — BP 150/80 | HR 60 | Ht 69.0 in | Wt 249.8 lb

## 2023-07-12 DIAGNOSIS — R7303 Prediabetes: Secondary | ICD-10-CM

## 2023-07-12 DIAGNOSIS — L304 Erythema intertrigo: Secondary | ICD-10-CM

## 2023-07-12 DIAGNOSIS — E782 Mixed hyperlipidemia: Secondary | ICD-10-CM

## 2023-07-12 DIAGNOSIS — K219 Gastro-esophageal reflux disease without esophagitis: Secondary | ICD-10-CM

## 2023-07-12 DIAGNOSIS — R1012 Left upper quadrant pain: Secondary | ICD-10-CM | POA: Diagnosis not present

## 2023-07-12 DIAGNOSIS — I10 Essential (primary) hypertension: Secondary | ICD-10-CM | POA: Diagnosis not present

## 2023-07-12 DIAGNOSIS — D508 Other iron deficiency anemias: Secondary | ICD-10-CM | POA: Diagnosis not present

## 2023-07-12 DIAGNOSIS — R197 Diarrhea, unspecified: Secondary | ICD-10-CM

## 2023-07-12 LAB — POCT URINALYSIS DIPSTICK
Bilirubin, UA: NEGATIVE
Blood, UA: NEGATIVE
Glucose, UA: NEGATIVE
Ketones, UA: NEGATIVE
Leukocytes, UA: NEGATIVE
Nitrite, UA: NEGATIVE
Protein, UA: NEGATIVE
Spec Grav, UA: 1.02 (ref 1.010–1.025)
Urobilinogen, UA: 0.2 U/dL
pH, UA: 6 (ref 5.0–8.0)

## 2023-07-12 MED ORDER — PANTOPRAZOLE SODIUM 40 MG PO TBEC: 40.0000 mg | DELAYED_RELEASE_TABLET | Freq: Every day | ORAL | 1 refills | Status: DC

## 2023-07-12 MED ORDER — AMLODIPINE BESYLATE 5 MG PO TABS: 5.0000 mg | ORAL_TABLET | Freq: Every day | ORAL | 11 refills | Status: DC

## 2023-07-12 NOTE — Progress Notes (Signed)
Established Patient Office Visit  Subjective:  Patient ID: Diana Green, female    DOB: November 10, 1950  Age: 72 y.o. MRN: [id number]  Chief Complaint  Patient presents with   Follow-up    3 month    Patient comes in with complaints of left upper quadrant/left flank discomfort for the last 4 days.  She denies dysuria or hematuria, no nausea vomiting, and no fevers or chills.  Urine dipstick in office is clear.   She does report of diarrhea but states that it started when she started an iron supplement.  She would have 2-3 loose BMs per day.  And at one time she stopped her iron supplement and her diarrhea resolved. Patient is scheduled for colonoscopy due to anemia and heme positive stools. She also had a negative Cologuard. However patient is worried, will schedule a CT abdomen without contrast.. Check labs today. Her blood pressure is high again, agrees to start Norvasc 5 mg/day.  Also mentions heartburn intermittently, will start Protonix daily.    No other concerns at this time.   Past Medical History:  Diagnosis Date   Arthritis    Depression    Essential hypertension, benign 10/09/2022   GERD (gastroesophageal reflux disease)    occ no meds   High cholesterol    Hypertension    Impaired glucose tolerance 10/09/2022   Iron deficiency anemia 10/09/2022   Vitamin D deficiency 10/09/2022    Past Surgical History:  Procedure Laterality Date   ANTERIOR CERVICAL DECOMP/DISCECTOMY FUSION N/A 07/08/2021   Procedure: C3-4 ANTERIOR CERVICAL DECOMPRESSION/DISCECTOMY FUSION 1 LEVEL WITH HEDRON;  Surgeon: Venetia Night, MD;  Location: [facility];  Service: Neurosurgery;  Laterality: N/A;   APPLICATION OF INTRAOPERATIVE CT SCAN N/A 09/19/2021   Procedure: APPLICATION OF INTRAOPERATIVE CT SCAN;  Surgeon: Venetia Night, MD;  Location: [facility];  Service: Neurosurgery;  Laterality: N/A;   APPLICATION OF WOUND VAC  09/19/2021   Procedure: APPLICATION OF WOUND VAC;  Surgeon:  Venetia Night, MD;  Location: [facility];  Service: Neurosurgery;;  [facility]   BACK SURGERY     thoracic   CESAREAN SECTION  1971   CHOLECYSTECTOMY     COLONOSCOPY     ROTATOR CUFF REPAIR Right 2006   TOTAL KNEE ARTHROPLASTY Left 2014   TOTAL KNEE ARTHROPLASTY Right 09/11/2022   Procedure: TOTAL KNEE ARTHROPLASTY;  Surgeon: Reinaldo Berber, MD;  Location: [facility];  Service: Orthopedics;  Laterality: Right;   TUBAL LIGATION  1975    Social History   Socioeconomic History   Marital status: Married    Spouse name: Not on file   Number of children: Not on file   Years of education: Not on file   Highest education level: Not on file  Occupational History   Not on file  Tobacco Use   Smoking status: Never   Smokeless tobacco: Never  Vaping Use   Vaping status: Never Used  Substance and Sexual Activity   Alcohol use: Yes    Comment: rarely   Drug use: No   Sexual activity: Not on file  Other Topics Concern   Not on file  Social History Narrative   Not on file   Social Determinants of Health   Financial Resource Strain: Low Risk  (02/28/2021)   [facility], [facility]   Overall Financial Resource Strain (CARDIA)    Difficulty of Paying Living Expenses: Not very hard  Food Insecurity: No Food Insecurity (09/11/2022)   Hunger Vital Sign  Worried About Programme researcher, broadcasting/film/video in the Last Year: Never true    Ran Out of Food in the Last Year: Never true  Transportation Needs: No Transportation Needs (09/11/2022)   PRAPARE - Administrator, Civil Service (Medical): No    Lack of Transportation (Non-Medical): No  Physical Activity: Not on file  Stress: Not on file  Social Connections: Not on file  Intimate Partner Violence: Not At Risk (09/11/2022)   Humiliation, Afraid, Rape, and Kick questionnaire    Fear of Current or Ex-Partner: No    Emotionally Abused: No    Physically Abused: No    Sexually Abused: No    History reviewed. No  pertinent family history.  No Known Allergies  Outpatient Medications Prior to Visit  Medication Sig   acetaminophen (TYLENOL) 500 MG tablet Take 1,000 mg by mouth 3 (three) times daily.   atenolol (TENORMIN) 100 MG tablet TAKE 1 TABLET BY MOUTH IN THE  MORNING   clotrimazole-betamethasone (LOTRISONE) cream Apply 1 Application topically 2 (two) times daily.   DULoxetine (CYMBALTA) 60 MG capsule Take 1 capsule (60 mg total) by mouth in the morning.   Fe Fum-FA-B Cmp-C-Zn-Mg-Mn-Cu (HEMOCYTE-PLUS) 106-1 MG TABS Take 1 tablet by mouth daily.   gabapentin (NEURONTIN) 600 MG tablet TAKE 1 TABLET BY MOUTH TWICE  DAILY   hydrochlorothiazide (HYDRODIURIL) 25 MG tablet TAKE 1 TABLET BY MOUTH IN THE  MORNING   Lidocaine 4 % PTCH Place 1 patch onto the skin daily as needed (back pain).   losartan (COZAAR) 100 MG tablet TAKE 1 TABLET BY MOUTH AT  BEDTIME   rosuvastatin (CRESTOR) 20 MG tablet Take 1 tablet (20 mg total) by mouth daily.   [DISCONTINUED] senna (SENOKOT) 8.6 MG TABS tablet Take 1 tablet (8.6 mg total) by mouth 2 (two) times daily. (Patient taking differently: Take 1 tablet by mouth at bedtime.)   [DISCONTINUED] simvastatin (ZOCOR) 20 MG tablet TAKE 1 TABLET BY MOUTH AT  BEDTIME   No facility-administered medications prior to visit.    Review of Systems  Constitutional: Negative.  Negative for chills, diaphoresis, fever, malaise/fatigue and weight loss.  HENT: Negative.  Negative for congestion.   Eyes: Negative.   Respiratory: Negative.  Negative for cough and shortness of breath.   Cardiovascular: Negative.  Negative for chest pain, palpitations and leg swelling.  Gastrointestinal:  Positive for heartburn. Negative for abdominal pain, blood in stool, constipation, diarrhea, nausea and vomiting.  Genitourinary:  Positive for flank pain. Negative for frequency and urgency.  Musculoskeletal:  Negative for joint pain and myalgias.  Skin: Negative.   Neurological: Negative.  Negative for  dizziness and headaches.  Endo/Heme/Allergies: Negative.   Psychiatric/Behavioral: Negative.  Negative for depression and suicidal ideas. The patient is not nervous/anxious.        Objective:   BP (!) 150/80   Pulse 60   Ht 5\' 9"  (1.753 m)   Wt 249 lb 12.8 oz (113.3 kg)   SpO2 95%   BMI 36.89 kg/m   Vitals:   07/12/23 1103  BP: (!) 150/80  Pulse: 60  Height: 5\' 9"  (1.753 m)  Weight: 249 lb 12.8 oz (113.3 kg)  SpO2: 95%  BMI (Calculated): 36.87    Physical Exam Vitals and nursing note reviewed.  Constitutional:      Appearance: Normal appearance.  HENT:     Head: Normocephalic and atraumatic.     Nose: Nose normal.     Mouth/Throat:     Mouth: Mucous  membranes are moist.     Pharynx: Oropharynx is clear.  Eyes:     Conjunctiva/sclera: Conjunctivae normal.     Pupils: Pupils are equal, round, and reactive to light.  Cardiovascular:     Rate and Rhythm: Normal rate and regular rhythm.     Pulses: Normal pulses.     Heart sounds: Normal heart sounds. No murmur heard. Pulmonary:     Effort: Pulmonary effort is normal.     Breath sounds: Normal breath sounds. No wheezing.  Abdominal:     General: Bowel sounds are normal.     Palpations: Abdomen is soft.     Tenderness: There is no abdominal tenderness. There is left CVA tenderness. There is no right CVA tenderness.  Musculoskeletal:        General: Normal range of motion.     Cervical back: Normal range of motion.     Right lower leg: No edema.     Left lower leg: No edema.  Skin:    General: Skin is warm and dry.  Neurological:     General: No focal deficit present.     Mental Status: She is alert and oriented to person, place, and time.  Psychiatric:        Mood and Affect: Mood normal.        Behavior: Behavior normal.      Results for orders placed or performed in visit on 07/12/23  POCT Urinalysis Dipstick (81002)  Result Value Ref Range   Color, UA orange    Clarity, UA slightly cloudy     Glucose, UA Negative Negative   Bilirubin, UA negative    Ketones, UA negative    Spec Grav, UA 1.020 1.010 - 1.025   Blood, UA negative    pH, UA 6.0 5.0 - 8.0   Protein, UA Negative Negative   Urobilinogen, UA 0.2 0.2 or 1.0 E.U./dL   Nitrite, UA negative    Leukocytes, UA Negative Negative   Appearance slightly cloudy    Odor none     Recent Results (from the past 2160 hour(s))  POCT Urinalysis Dipstick (42595)     Status: Abnormal   Collection Time: 07/12/23 11:54 AM  Result Value Ref Range   Color, UA orange    Clarity, UA slightly cloudy    Glucose, UA Negative Negative   Bilirubin, UA negative    Ketones, UA negative    Spec Grav, UA 1.020 1.010 - 1.025   Blood, UA negative    pH, UA 6.0 5.0 - 8.0   Protein, UA Negative Negative   Urobilinogen, UA 0.2 0.2 or 1.0 E.U./dL   Nitrite, UA negative    Leukocytes, UA Negative Negative   Appearance slightly cloudy    Odor none       Assessment & Plan:  Add Norvasc and Protonix. Check labs. Schedule CT abdomen. Monitor blood pressure. Problem List Items Addressed This Visit     Mixed hyperlipidemia   Relevant Medications   amLODipine (NORVASC) 5 MG tablet   Other Relevant Orders   Lipid Panel w/o Chol/HDL Ratio   Essential hypertension, benign - Primary   Relevant Medications   amLODipine (NORVASC) 5 MG tablet   Other Relevant Orders   CMP14+EGFR   Iron deficiency anemia   Relevant Orders   CBC with Diff   Prediabetes   Relevant Orders   Hemoglobin A1c   POCT Urinalysis Dipstick (63875) (Completed)   Other Visit Diagnoses     Left upper quadrant  abdominal pain       Relevant Orders   CT ABDOMEN PELVIS WO CONTRAST   POCT Urinalysis Dipstick (84696) (Completed)   Diarrhea, unspecified type       Gastroesophageal reflux disease without esophagitis       Relevant Medications   pantoprazole (PROTONIX) 40 MG tablet       Return in about 2 weeks (around 07/26/2023).   Total time spent: 30  minutes  Margaretann Loveless, MD  07/12/2023   This document may have been prepared by [facility] Voice Recognition software and as such may include unintentional dictation errors.

## 2023-07-13 LAB — CBC WITH DIFFERENTIAL/PLATELET
Basophils Absolute: 0 10*3/uL (ref 0.0–0.2)
Basos: 1 %
EOS (ABSOLUTE): 0.2 10*3/uL (ref 0.0–0.4)
Eos: 4 %
Hematocrit: 35.9 % (ref 34.0–46.6)
Hemoglobin: 11.5 g/dL (ref 11.1–15.9)
Immature Grans (Abs): 0 10*3/uL (ref 0.0–0.1)
Immature Granulocytes: 1 %
Lymphocytes Absolute: 1.5 10*3/uL (ref 0.7–3.1)
Lymphs: 26 %
MCH: 29.7 pg (ref 26.6–33.0)
MCHC: 32 g/dL (ref 31.5–35.7)
MCV: 93 fL (ref 79–97)
Monocytes Absolute: 0.4 10*3/uL (ref 0.1–0.9)
Monocytes: 7 %
Neutrophils Absolute: 3.5 10*3/uL (ref 1.4–7.0)
Neutrophils: 61 %
Platelets: 251 10*3/uL (ref 150–450)
RBC: 3.87 x10E6/uL (ref 3.77–5.28)
RDW: 13.1 % (ref 11.7–15.4)
WBC: 5.7 10*3/uL (ref 3.4–10.8)

## 2023-07-13 LAB — CMP14+EGFR
ALT: 39 [IU]/L — ABNORMAL HIGH (ref 0–32)
AST: 35 [IU]/L (ref 0–40)
Albumin: 4.3 g/dL (ref 3.8–4.8)
Alkaline Phosphatase: 72 [IU]/L (ref 44–121)
BUN/Creatinine Ratio: 16 (ref 12–28)
BUN: 19 mg/dL (ref 8–27)
Bilirubin Total: 0.2 mg/dL (ref 0.0–1.2)
CO2: 26 mmol/L (ref 20–29)
Calcium: 10.5 mg/dL — ABNORMAL HIGH (ref 8.7–10.3)
Chloride: 101 mmol/L (ref 96–106)
Creatinine, Ser: 1.17 mg/dL — ABNORMAL HIGH (ref 0.57–1.00)
Globulin, Total: 2.9 g/dL (ref 1.5–4.5)
Glucose: 89 mg/dL (ref 70–99)
Potassium: 4.4 mmol/L (ref 3.5–5.2)
Sodium: 143 mmol/L (ref 134–144)
Total Protein: 7.2 g/dL (ref 6.0–8.5)
eGFR: 50 mL/min/{1.73_m2} — ABNORMAL LOW (ref >59)

## 2023-07-13 LAB — LIPID PANEL W/O CHOL/HDL RATIO
Cholesterol, Total: 174 mg/dL (ref 100–199)
HDL: 70 mg/dL (ref >39)
LDL Chol Calc (NIH): 81 mg/dL (ref 0–99)
Triglycerides: 137 mg/dL (ref 0–149)
VLDL Cholesterol Cal: 23 mg/dL (ref 5–40)

## 2023-07-13 LAB — HEMOGLOBIN A1C
Est. average glucose Bld gHb Est-mCnc: 134 mg/dL
Hgb A1c MFr Bld: 6.3 % — ABNORMAL HIGH (ref 4.8–5.6)

## 2023-07-16 DIAGNOSIS — M545 Low back pain, unspecified: Secondary | ICD-10-CM | POA: Diagnosis not present

## 2023-07-16 DIAGNOSIS — R2681 Unsteadiness on feet: Secondary | ICD-10-CM | POA: Diagnosis not present

## 2023-07-16 DIAGNOSIS — M79669 Pain in unspecified lower leg: Secondary | ICD-10-CM | POA: Diagnosis not present

## 2023-07-17 DIAGNOSIS — H2513 Age-related nuclear cataract, bilateral: Secondary | ICD-10-CM | POA: Diagnosis not present

## 2023-07-23 ENCOUNTER — Ambulatory Visit: Payer: Medicare Other

## 2023-07-23 DIAGNOSIS — Z860101 Personal history of adenomatous and serrated colon polyps: Secondary | ICD-10-CM | POA: Diagnosis not present

## 2023-07-23 DIAGNOSIS — Z09 Encounter for follow-up examination after completed treatment for conditions other than malignant neoplasm: Secondary | ICD-10-CM | POA: Diagnosis not present

## 2023-07-23 DIAGNOSIS — K640 First degree hemorrhoids: Secondary | ICD-10-CM | POA: Diagnosis not present

## 2023-07-23 DIAGNOSIS — R195 Other fecal abnormalities: Secondary | ICD-10-CM | POA: Diagnosis not present

## 2023-07-23 DIAGNOSIS — K5730 Diverticulosis of large intestine without perforation or abscess without bleeding: Secondary | ICD-10-CM | POA: Diagnosis not present

## 2023-07-26 ENCOUNTER — Ambulatory Visit: Payer: Medicare Other | Admitting: Internal Medicine

## 2023-07-26 ENCOUNTER — Encounter: Payer: Self-pay | Admitting: Internal Medicine

## 2023-07-26 VITALS — BP 142/80 | HR 61 | Ht 69.0 in | Wt 255.8 lb

## 2023-07-26 DIAGNOSIS — Z0000 Encounter for general adult medical examination without abnormal findings: Secondary | ICD-10-CM | POA: Diagnosis not present

## 2023-07-26 DIAGNOSIS — D508 Other iron deficiency anemias: Secondary | ICD-10-CM

## 2023-07-26 DIAGNOSIS — R7303 Prediabetes: Secondary | ICD-10-CM | POA: Diagnosis not present

## 2023-07-26 DIAGNOSIS — E782 Mixed hyperlipidemia: Secondary | ICD-10-CM

## 2023-07-26 DIAGNOSIS — I10 Essential (primary) hypertension: Secondary | ICD-10-CM | POA: Diagnosis not present

## 2023-07-26 DIAGNOSIS — K219 Gastro-esophageal reflux disease without esophagitis: Secondary | ICD-10-CM | POA: Diagnosis not present

## 2023-07-26 NOTE — Progress Notes (Signed)
Established Patient Office Visit  Subjective:  Patient ID: Diana Green, female    DOB: 01-01-1951  Age: 72 y.o. MRN: [id number]  Chief Complaint  Patient presents with   Annual Exam    AWV    Patient comes in for her follow-up and for annual wellness visit. Her blood pressure is better than before but still slightly elevated. She had her colonoscopy earlier this week and it was unremarkable except for hemorrhoids.  She continues to complain of left upper quadrant pain and a CT of the abdomen has been scheduled.  Denies nausea or vomiting, no diarrhea. Labs discussed, stable. She is waiting for her mammogram. DEXA was done last year. Her PHQ-9/GAD score is 10/7.  Patient is currently on Cymbalta and would like to continue taking it. Her CFS score is 2.    No other concerns at this time.   Past Medical History:  Diagnosis Date   Arthritis    Depression    Essential hypertension, benign 10/09/2022   GERD (gastroesophageal reflux disease)    occ no meds   High cholesterol    Hypertension    Impaired glucose tolerance 10/09/2022   Iron deficiency anemia 10/09/2022   Vitamin D deficiency 10/09/2022    Past Surgical History:  Procedure Laterality Date   ANTERIOR CERVICAL DECOMP/DISCECTOMY FUSION N/A 07/08/2021   Procedure: C3-4 ANTERIOR CERVICAL DECOMPRESSION/DISCECTOMY FUSION 1 LEVEL WITH HEDRON;  Surgeon: Venetia Night, MD;  Location: [facility];  Service: Neurosurgery;  Laterality: N/A;   APPLICATION OF INTRAOPERATIVE CT SCAN N/A 09/19/2021   Procedure: APPLICATION OF INTRAOPERATIVE CT SCAN;  Surgeon: Venetia Night, MD;  Location: [facility];  Service: Neurosurgery;  Laterality: N/A;   APPLICATION OF WOUND VAC  09/19/2021   Procedure: APPLICATION OF WOUND VAC;  Surgeon: Venetia Night, MD;  Location: [facility];  Service: Neurosurgery;;  [facility]   BACK SURGERY     thoracic   CESAREAN SECTION  1971   CHOLECYSTECTOMY     COLONOSCOPY     ROTATOR CUFF REPAIR Right  2006   TOTAL KNEE ARTHROPLASTY Left 2014   TOTAL KNEE ARTHROPLASTY Right 09/11/2022   Procedure: TOTAL KNEE ARTHROPLASTY;  Surgeon: Reinaldo Berber, MD;  Location: [facility];  Service: Orthopedics;  Laterality: Right;   TUBAL LIGATION  1975    Social History   Socioeconomic History   Marital status: Married    Spouse name: Not on file   Number of children: Not on file   Years of education: Not on file   Highest education level: Not on file  Occupational History   Not on file  Tobacco Use   Smoking status: Never   Smokeless tobacco: Never  Vaping Use   Vaping status: Never Used  Substance and Sexual Activity   Alcohol use: Yes    Comment: rarely   Drug use: No   Sexual activity: Not on file  Other Topics Concern   Not on file  Social History Narrative   Not on file   Social Determinants of Health   Financial Resource Strain: Low Risk  (02/28/2021)   Received from [facility], [facility], [facility]   Overall Financial Resource Strain (CARDIA)    Difficulty of Paying Living Expenses: Not very hard  Food Insecurity: No Food Insecurity (09/11/2022)   Hunger Vital Sign    Worried About Running Out of Food in the Last Year: Never true    Ran Out of Food in the Last Year: Never true  Transportation Needs: No  Transportation Needs (09/11/2022)   PRAPARE - Administrator, Civil Service (Medical): No    Lack of Transportation (Non-Medical): No  Physical Activity: Not on file  Stress: Not on file  Social Connections: Not on file  Intimate Partner Violence: Not At Risk (09/11/2022)   Humiliation, Afraid, Rape, and Kick questionnaire    Fear of Current or Ex-Partner: No    Emotionally Abused: No    Physically Abused: No    Sexually Abused: No    History reviewed. No pertinent family history.  No Known Allergies  Outpatient Medications Prior to Visit  Medication Sig   acetaminophen (TYLENOL) 500 MG tablet Take 1,000 mg by mouth 3 (three) times daily.   amLODipine  (NORVASC) 5 MG tablet Take 1 tablet (5 mg total) by mouth daily.   atenolol (TENORMIN) 100 MG tablet TAKE 1 TABLET BY MOUTH IN THE  MORNING   clotrimazole-betamethasone (LOTRISONE) cream APPLY CREAM TOPICALLY TWICE DAILY   DULoxetine (CYMBALTA) 60 MG capsule Take 1 capsule (60 mg total) by mouth in the morning.   Fe Fum-FA-B Cmp-C-Zn-Mg-Mn-Cu (HEMOCYTE-PLUS) 106-1 MG TABS Take 1 tablet by mouth daily.   gabapentin (NEURONTIN) 600 MG tablet TAKE 1 TABLET BY MOUTH TWICE  DAILY   hydrochlorothiazide (HYDRODIURIL) 25 MG tablet TAKE 1 TABLET BY MOUTH IN THE  MORNING   Lidocaine 4 % PTCH Place 1 patch onto the skin daily as needed (back pain).   losartan (COZAAR) 100 MG tablet TAKE 1 TABLET BY MOUTH AT  BEDTIME   pantoprazole (PROTONIX) 40 MG tablet Take 1 tablet (40 mg total) by mouth daily.   rosuvastatin (CRESTOR) 20 MG tablet Take 1 tablet (20 mg total) by mouth daily.   No facility-administered medications prior to visit.    Review of Systems  Constitutional: Negative.  Negative for chills, diaphoresis, fever, malaise/fatigue and weight loss.  HENT: Negative.  Negative for hearing loss and sinus pain.   Eyes: Negative.  Negative for blurred vision and photophobia.  Respiratory: Negative.  Negative for cough, shortness of breath and stridor.   Cardiovascular: Negative.  Negative for chest pain, palpitations and leg swelling.  Gastrointestinal: Negative.  Negative for abdominal pain, constipation, diarrhea, heartburn, nausea and vomiting.  Genitourinary: Negative.  Negative for dysuria and flank pain.  Musculoskeletal: Negative.  Negative for joint pain and myalgias.  Skin: Negative.   Neurological: Negative.  Negative for dizziness, tingling, tremors, sensory change, speech change and headaches.  Endo/Heme/Allergies: Negative.   Psychiatric/Behavioral: Negative.  Negative for depression and suicidal ideas. The patient is not nervous/anxious.        Objective:   BP (!) 142/80   Pulse  61   Ht 5\' 9"  (1.753 m)   Wt 255 lb 12.8 oz (116 kg)   SpO2 96%   BMI 37.78 kg/m   Vitals:   07/26/23 1346  BP: (!) 142/80  Pulse: 61  Height: 5\' 9"  (1.753 m)  Weight: 255 lb 12.8 oz (116 kg)  SpO2: 96%  BMI (Calculated): 37.76    Physical Exam Vitals and nursing note reviewed.  Constitutional:      Appearance: Normal appearance.  HENT:     Head: Normocephalic and atraumatic.     Nose: Nose normal.     Mouth/Throat:     Mouth: Mucous membranes are moist.     Pharynx: Oropharynx is clear.  Eyes:     Conjunctiva/sclera: Conjunctivae normal.     Pupils: Pupils are equal, round, and reactive to light.  Cardiovascular:  Rate and Rhythm: Normal rate and regular rhythm.     Pulses: Normal pulses.     Heart sounds: Normal heart sounds. No murmur heard. Pulmonary:     Effort: Pulmonary effort is normal.     Breath sounds: Normal breath sounds. No wheezing.  Abdominal:     General: Bowel sounds are normal.     Palpations: Abdomen is soft. There is no mass.     Tenderness: There is no abdominal tenderness. There is no right CVA tenderness, left CVA tenderness, guarding or rebound.  Musculoskeletal:        General: Normal range of motion.     Cervical back: Normal range of motion.     Right lower leg: No edema.     Left lower leg: No edema.  Skin:    General: Skin is warm and dry.  Neurological:     General: No focal deficit present.     Mental Status: She is alert and oriented to person, place, and time.  Psychiatric:        Mood and Affect: Mood normal.        Behavior: Behavior normal.      No results found for any visits on 07/26/23.  Recent Results (from the past 2160 hour(s))  POCT Urinalysis Dipstick (40981)     Status: Abnormal   Collection Time: 07/12/23 11:54 AM  Result Value Ref Range   Color, UA orange    Clarity, UA slightly cloudy    Glucose, UA Negative Negative   Bilirubin, UA negative    Ketones, UA negative    Spec Grav, UA 1.020 1.010 -  1.025   Blood, UA negative    pH, UA 6.0 5.0 - 8.0   Protein, UA Negative Negative   Urobilinogen, UA 0.2 0.2 or 1.0 E.U./dL   Nitrite, UA negative    Leukocytes, UA Negative Negative   Appearance slightly cloudy    Odor none   CBC with Diff     Status: None   Collection Time: 07/12/23 11:56 AM  Result Value Ref Range   WBC 5.7 3.4 - 10.8 x10E3/uL    Comment: **Effective July 23, 2023 profile [id number] WBC will be made**   non-orderable as a stand-alone order code.    RBC 3.87 3.77 - 5.28 x10E6/uL   Hemoglobin 11.5 11.1 - 15.9 g/dL   Hematocrit 19.1 47.8 - 46.6 %   MCV 93 79 - 97 fL   MCH 29.7 26.6 - 33.0 pg   MCHC 32.0 31.5 - 35.7 g/dL   RDW 29.5 62.1 - 30.8 %   Platelets 251 150 - 450 x10E3/uL   Neutrophils 61 Not Estab. %   Lymphs 26 Not Estab. %   Monocytes 7 Not Estab. %   Eos 4 Not Estab. %   Basos 1 Not Estab. %   Neutrophils Absolute 3.5 1.4 - 7.0 x10E3/uL   Lymphocytes Absolute 1.5 0.7 - 3.1 x10E3/uL   Monocytes Absolute 0.4 0.1 - 0.9 x10E3/uL   EOS (ABSOLUTE) 0.2 0.0 - 0.4 x10E3/uL   Basophils Absolute 0.0 0.0 - 0.2 x10E3/uL   Immature Granulocytes 1 Not Estab. %   Immature Grans (Abs) 0.0 0.0 - 0.1 x10E3/uL  CMP14+EGFR     Status: Abnormal   Collection Time: 07/12/23 11:56 AM  Result Value Ref Range   Glucose 89 70 - 99 mg/dL   BUN 19 8 - 27 mg/dL   Creatinine, Ser 6.57 (H) 0.57 - 1.00 mg/dL   eGFR 50 (L) >  59 mL/min/1.73   BUN/Creatinine Ratio 16 12 - 28   Sodium 143 134 - 144 mmol/L   Potassium 4.4 3.5 - 5.2 mmol/L   Chloride 101 96 - 106 mmol/L   CO2 26 20 - 29 mmol/L   Calcium 10.5 (H) 8.7 - 10.3 mg/dL   Total Protein 7.2 6.0 - 8.5 g/dL   Albumin 4.3 3.8 - 4.8 g/dL   Globulin, Total 2.9 1.5 - 4.5 g/dL   Bilirubin Total 0.2 0.0 - 1.2 mg/dL   Alkaline Phosphatase 72 44 - 121 IU/L   AST 35 0 - 40 IU/L   ALT 39 (H) 0 - 32 IU/L  Lipid Panel w/o Chol/HDL Ratio     Status: None   Collection Time: 07/12/23 11:56 AM  Result Value Ref Range    Cholesterol, Total 174 100 - 199 mg/dL   Triglycerides 161 0 - 149 mg/dL   HDL 70 >09 mg/dL   VLDL Cholesterol Cal 23 5 - 40 mg/dL   LDL Chol Calc (NIH) 81 0 - 99 mg/dL  Hemoglobin U0A     Status: Abnormal   Collection Time: 07/12/23 11:56 AM  Result Value Ref Range   Hgb A1c MFr Bld 6.3 (H) 4.8 - 5.6 %    Comment:          Prediabetes: 5.7 - 6.4          Diabetes: >6.4          Glycemic control for adults with diabetes: <7.0    Est. average glucose Bld gHb Est-mCnc 134 mg/dL      Assessment & Plan:  Patient advised to continue all her medications. Monitor blood pressure at home. Will call with CT results once available. Problem List Items Addressed This Visit     Mixed hyperlipidemia   Essential hypertension, benign   Iron deficiency anemia   Prediabetes   Gastroesophageal reflux disease without esophagitis   Other Visit Diagnoses     Annual wellness visit    -  Primary       Return in about 3 months (around 10/24/2023).   Total time spent: 30 minutes  Margaretann Loveless, MD  07/26/2023   This document may have been prepared by [facility] Voice Recognition software and as such may include unintentional dictation errors.

## 2023-07-27 DIAGNOSIS — M545 Low back pain, unspecified: Secondary | ICD-10-CM | POA: Diagnosis not present

## 2023-07-27 DIAGNOSIS — M79669 Pain in unspecified lower leg: Secondary | ICD-10-CM | POA: Diagnosis not present

## 2023-07-27 DIAGNOSIS — R2681 Unsteadiness on feet: Secondary | ICD-10-CM | POA: Diagnosis not present

## 2023-07-29 ENCOUNTER — Emergency Department
Admission: EM | Admit: 2023-07-29 | Discharge: 2023-07-29 | Disposition: A | Discharge status: Home / Self Care | Payer: Medicare Other | Attending: Emergency Medicine | Admitting: Emergency Medicine

## 2023-07-29 ENCOUNTER — Emergency Department: Payer: Medicare Other

## 2023-07-29 ENCOUNTER — Other Ambulatory Visit: Payer: Self-pay

## 2023-07-29 DIAGNOSIS — Z9049 Acquired absence of other specified parts of digestive tract: Secondary | ICD-10-CM | POA: Diagnosis not present

## 2023-07-29 DIAGNOSIS — I10 Essential (primary) hypertension: Secondary | ICD-10-CM | POA: Diagnosis not present

## 2023-07-29 DIAGNOSIS — D1771 Benign lipomatous neoplasm of kidney: Secondary | ICD-10-CM | POA: Diagnosis not present

## 2023-07-29 DIAGNOSIS — R1032 Left lower quadrant pain: Secondary | ICD-10-CM | POA: Diagnosis not present

## 2023-07-29 DIAGNOSIS — N179 Acute kidney failure, unspecified: Secondary | ICD-10-CM | POA: Diagnosis not present

## 2023-07-29 DIAGNOSIS — R109 Unspecified abdominal pain: Secondary | ICD-10-CM | POA: Diagnosis not present

## 2023-07-29 LAB — CBC
HCT: 34.4 % — ABNORMAL LOW (ref 36.0–46.0)
Hemoglobin: 11.2 g/dL — ABNORMAL LOW (ref 12.0–15.0)
MCH: 30.3 pg (ref 26.0–34.0)
MCHC: 32.6 g/dL (ref 30.0–36.0)
MCV: 93 fL (ref 80.0–100.0)
Platelets: 211 10*3/uL (ref 150–400)
RBC: 3.7 MIL/uL — ABNORMAL LOW (ref 3.87–5.11)
RDW: 12.6 % (ref 11.5–15.5)
WBC: 6.1 10*3/uL (ref 4.0–10.5)
nRBC: 0 % (ref 0.0–0.2)

## 2023-07-29 LAB — COMPREHENSIVE METABOLIC PANEL
ALT: 35 U/L (ref 0–44)
AST: 32 U/L (ref 15–41)
Albumin: 4.1 g/dL (ref 3.5–5.0)
Alkaline Phosphatase: 61 U/L (ref 38–126)
Anion gap: 9 (ref 5–15)
BUN: 26 mg/dL — ABNORMAL HIGH (ref 8–23)
CO2: 28 mmol/L (ref 22–32)
Calcium: 9.8 mg/dL (ref 8.9–10.3)
Chloride: 101 mmol/L (ref 98–111)
Creatinine, Ser: 1.27 mg/dL — ABNORMAL HIGH (ref 0.44–1.00)
GFR, Estimated: 45 mL/min — ABNORMAL LOW (ref >60)
Glucose, Bld: 103 mg/dL — ABNORMAL HIGH (ref 70–99)
Potassium: 4.1 mmol/L (ref 3.5–5.1)
Sodium: 138 mmol/L (ref 135–145)
Total Bilirubin: 0.6 mg/dL (ref <1.2)
Total Protein: 7.8 g/dL (ref 6.5–8.1)

## 2023-07-29 LAB — URINALYSIS, ROUTINE W REFLEX MICROSCOPIC
Bilirubin Urine: NEGATIVE
Glucose, UA: NEGATIVE mg/dL
Hgb urine dipstick: NEGATIVE
Ketones, ur: NEGATIVE mg/dL
Leukocytes,Ua: NEGATIVE
Nitrite: NEGATIVE
Protein, ur: NEGATIVE mg/dL
Specific Gravity, Urine: 1.015 (ref 1.005–1.030)
pH: 5.5 (ref 5.0–8.0)

## 2023-07-29 LAB — LIPASE, BLOOD: Lipase: 35 U/L (ref 11–51)

## 2023-07-29 MED ORDER — ONDANSETRON HCL 4 MG/2ML IJ SOLN
4.0000 mg | Freq: Once | INTRAMUSCULAR | Status: AC
  Administered 2023-07-29: 4 mg via INTRAVENOUS
  Filled 2023-07-29: qty 2

## 2023-07-29 MED ORDER — SODIUM CHLORIDE 0.9 % IV BOLUS
1000.0000 mL | Freq: Once | INTRAVENOUS | Status: AC
  Administered 2023-07-29: 1000 mL via INTRAVENOUS

## 2023-07-29 MED ORDER — IOHEXOL 350 MG/ML SOLN
80.0000 mL | Freq: Once | INTRAVENOUS | Status: AC | PRN
  Administered 2023-07-29: 80 mL via INTRAVENOUS

## 2023-07-29 MED ORDER — DICYCLOMINE HCL 10 MG PO CAPS: 10.0000 mg | ORAL_CAPSULE | Freq: Three times a day (TID) | ORAL | 0 refills | Status: DC

## 2023-07-29 MED ORDER — DICYCLOMINE HCL 10 MG PO CAPS
10.0000 mg | ORAL_CAPSULE | Freq: Once | ORAL | Status: AC
  Administered 2023-07-29: 10 mg via ORAL
  Filled 2023-07-29: qty 1

## 2023-07-29 MED ORDER — FENTANYL CITRATE PF 50 MCG/ML IJ SOSY
50.0000 ug | PREFILLED_SYRINGE | Freq: Once | INTRAMUSCULAR | Status: AC
  Administered 2023-07-29: 50 ug via INTRAVENOUS
  Filled 2023-07-29: qty 1

## 2023-07-29 NOTE — ED Provider Notes (Signed)
[facility] Emergency Department Provider Note     Event Date/Time   First MD Initiated Contact with Patient 07/29/23 1604     (approximate)   History   Abdominal Pain   HPI  Diana Green is a 72 y.o. female with a history of HLD, HTN, vitamin deficiency, arthritis, GERD, presents to the ED for evaluation of left-sided lower abdominal pain for the last week.  She endorses worsening symptoms with intermittent episodes of watery stools.  Patient denies any frank fevers, nausea, vomiting, or anorexia.  She would endorse a recent normal colonoscopy, which apparently did confirm diverticulosis.  Physical Exam   Triage Vital Signs: ED Triage Vitals  Encounter Vitals Group     BP 07/29/23 1343 115/67     Systolic BP Percentile --      Diastolic BP Percentile --      Pulse Rate 07/29/23 1343 (!) 58     Resp 07/29/23 1343 17     Temp 07/29/23 1343 98 F (36.7 C)     Temp Source 07/29/23 1343 Oral     SpO2 07/29/23 1343 100 %     Weight 07/29/23 1344 247 lb (112 kg)     Height 07/29/23 1344 5\' 9"  (1.753 m)     Head Circumference --      Peak Flow --      Pain Score 07/29/23 1344 8     Pain Loc --      Pain Education --      Exclude from Growth Chart --     Most recent vital signs: Vitals:   07/29/23 1343 07/29/23 1844  BP: 115/67 106/61  Pulse: (!) 58 (!) 58  Resp: 17 16  Temp: 98 F (36.7 C) 98 F (36.7 C)  SpO2: 100% 97%    General Awake, no distress. NAD CV:  Good peripheral perfusion.  RESP:  Normal effort.  ABD:  No distention.  Normal bowel sounds x 4.  Abdomen is soft and tender without rebound, guarding, or rigidity.   ED Results / Procedures / Treatments   Labs (all labs ordered are listed, but only abnormal results are displayed) Labs Reviewed  COMPREHENSIVE METABOLIC PANEL - Abnormal; Notable for the following components:      Result Value   Glucose, Bld 103 (*)    BUN 26 (*)    Creatinine, Ser 1.27 (*)    GFR,  Estimated 45 (*)    All other components within normal limits  CBC - Abnormal; Notable for the following components:   RBC 3.70 (*)    Hemoglobin 11.2 (*)    HCT 34.4 (*)    All other components within normal limits  LIPASE, BLOOD  URINALYSIS, ROUTINE W REFLEX MICROSCOPIC   EKG   RADIOLOGY  I personally viewed and evaluated these images as part of my medical decision making, as well as reviewing the written report by the radiologist.  ED Provider Interpretation: No acute findings  CT ABDOMEN PELVIS W CONTRAST  Result Date: 07/29/2023 CLINICAL DATA:  Left-sided abdominal pain for over a week, worsening. Loose stools. EXAM: CT ABDOMEN AND PELVIS WITH CONTRAST TECHNIQUE: Multidetector CT imaging of the abdomen and pelvis was performed using the standard protocol following bolus administration of intravenous contrast. RADIATION DOSE REDUCTION: This exam was performed according to the departmental dose-optimization program which includes automated exposure control, adjustment of the mA and/or kV according to patient size and/or use of iterative reconstruction technique. CONTRAST:  80mL  OMNIPAQUE IOHEXOL 350 MG/ML SOLN COMPARISON:  None Available. FINDINGS: Lower chest: Clear lung bases. Hepatobiliary: No focal liver abnormality is seen. Status post cholecystectomy. No biliary dilatation. Pancreas: Unremarkable. No pancreatic ductal dilatation or surrounding inflammatory changes. Spleen: Normal size.  No mass.  Single calcified granuloma. Adrenals/Urinary Tract: Normal adrenal glands. Kidneys normal in overall size, orientation and position with symmetric enhancement and excretion. Multiple small renal cysts, most subcentimeter not fully characterized. 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 No suspicious masses. No intrarenal stones. No hydronephrosis. Normal ureters. Normal bladder. Stomach/Bowel: Normal stomach. Small bowel and colon  are normal in caliber. No wall thickening. No inflammation. Normal appendix. Vascular/Lymphatic: No significant vascular abnormality. No enlarged lymph nodes. Reproductive: Uterus normal in size. Small anterior subserosal fibroid. No adnexal masses. Other: No abdominal wall hernia or abnormality. No abdominopelvic ascites. Musculoskeletal: No fracture or acute finding. Previous posterior fusion at T11-T12. Orthopedic hardware appears well positioned and seated. No bone lesion. Degenerative changes noted throughout the visualized spine. IMPRESSION: No acute findings within the abdomen or pelvis. No findings to account for the patient's symptoms. None Electronically Signed   By: Amie Portland M.D.   On: 07/29/2023 18:29     PROCEDURES:  Critical Care performed: No  Procedures   MEDICATIONS ORDERED IN ED: Medications  sodium chloride 0.9 % bolus 1,000 mL (0 mLs Intravenous Stopped 07/29/23 2033)  ondansetron (ZOFRAN) injection 4 mg (4 mg Intravenous Given 07/29/23 1839)  fentaNYL (SUBLIMAZE) injection 50 mcg (50 mcg Intravenous Given 07/29/23 1840)  iohexol (OMNIPAQUE) 350 MG/ML injection 80 mL (80 mLs Intravenous Contrast Given 07/29/23 1805)  dicyclomine (BENTYL) capsule 10 mg (10 mg Oral Given 07/29/23 1926)     IMPRESSION / MDM / ASSESSMENT AND PLAN / ED COURSE  I reviewed the triage vital signs and the nursing notes.                              Differential diagnosis includes, but is not limited to, ovarian cyst, ovarian torsion, acute appendicitis, diverticulitis, urinary tract infection/pyelonephritis, bowel obstruction, colitis, renal colic, gastroenteritis, hernia, fibroids, endometriosis, etc.  Patient's presentation is most consistent with acute complicated illness / injury requiring diagnostic workup.  Patient's diagnosis is consistent with left lower quadrant abdominal pain with unclear etiology.  Patient with left lower quadrant abdominal pain and an AKI on exam exam today.   Patient with reassuring exam and workup at this time.  No tractable nausea vomiting on exam.  She was found based on labs to be slightly dehydrated with a BUN/creatinine of 26/1.27.  She was also found to have a mild AKI with a GFR decreased to 45 from her base of > 60.  No UA evidence of acute leukocyturia.  CT scan was overall reassuring without signs of diverticulitis, colitis, or appendicitis.  Patient is endorsing improved symptoms at this time.  She reports she is hungry and ready to be discharged.  Patient will be discharged home with prescriptions for dicyclomine.  Patient is to follow up with her PCP as discussed, as needed or otherwise directed. Patient is given ED precautions to return to the ED for any worsening or new symptoms.   FINAL CLINICAL IMPRESSION(S) / ED DIAGNOSES   Final diagnoses:  Left lower quadrant abdominal pain  AKI (acute kidney injury) (HCC)     Rx / DC Orders   ED Discharge Orders  Ordered    dicyclomine (BENTYL) 10 MG capsule  3 times daily before meals        07/29/23 2029             Note:  This document was prepared using Dragon voice recognition software and may include unintentional dictation errors.    Lissa Hoard, PA-C 07/29/23 2252    Phineas Semen, MD 07/29/23 2258

## 2023-07-29 NOTE — ED Triage Notes (Signed)
Pt sts that she has been having left sided abd pain for over a week that has gotten worse. Pt sts that she has been having loose stool also.

## 2023-07-29 NOTE — Discharge Instructions (Addendum)
Your exam, labs, and CT scan are overall normal and reassuring.  No evidence on the CT scan to explain your left lower quadrant abdominal pain.  You do have some evidence of some dehydration and mild kidney injury secondary to that.  You should continue to hydrate with fluids like Gatorade and Powerade.  Take the prescription Bentyl as needed for abdominal pain.  Follow-up with your primary provider for ongoing concerns.  Return to the ED if necessary.

## 2023-08-03 ENCOUNTER — Ambulatory Visit
Admission: RE | Admit: 2023-08-03 | Discharge: 2023-08-03 | Disposition: A | Discharge status: Home / Self Care | Payer: Medicare Other | Source: Ambulatory Visit | Attending: Internal Medicine | Admitting: Internal Medicine

## 2023-08-03 DIAGNOSIS — Z1231 Encounter for screening mammogram for malignant neoplasm of breast: Secondary | ICD-10-CM | POA: Insufficient documentation

## 2023-08-06 DIAGNOSIS — R2681 Unsteadiness on feet: Secondary | ICD-10-CM | POA: Diagnosis not present

## 2023-08-06 DIAGNOSIS — M79669 Pain in unspecified lower leg: Secondary | ICD-10-CM | POA: Diagnosis not present

## 2023-08-06 DIAGNOSIS — M545 Low back pain, unspecified: Secondary | ICD-10-CM | POA: Diagnosis not present

## 2023-08-07 ENCOUNTER — Encounter: Payer: Self-pay | Admitting: Internal Medicine

## 2023-08-07 ENCOUNTER — Other Ambulatory Visit: Payer: Medicare Other

## 2023-08-07 ENCOUNTER — Ambulatory Visit (INDEPENDENT_AMBULATORY_CARE_PROVIDER_SITE_OTHER): Payer: Medicare Other | Admitting: Internal Medicine

## 2023-08-07 VITALS — BP 134/87 | HR 62 | Ht 69.0 in | Wt 255.0 lb

## 2023-08-07 DIAGNOSIS — I10 Essential (primary) hypertension: Secondary | ICD-10-CM | POA: Diagnosis not present

## 2023-08-07 DIAGNOSIS — R7303 Prediabetes: Secondary | ICD-10-CM

## 2023-08-07 DIAGNOSIS — R197 Diarrhea, unspecified: Secondary | ICD-10-CM | POA: Diagnosis not present

## 2023-08-07 DIAGNOSIS — E782 Mixed hyperlipidemia: Secondary | ICD-10-CM

## 2023-08-07 DIAGNOSIS — K219 Gastro-esophageal reflux disease without esophagitis: Secondary | ICD-10-CM

## 2023-08-07 MED ORDER — CHOLESTYRAMINE 4 G PO PACK: 1.0000 | PACK | Freq: Two times a day (BID) | ORAL | 11 refills | Status: DC

## 2023-08-07 NOTE — Progress Notes (Signed)
Established Patient Office Visit  Subjective:  Patient ID: Diana Green, female    DOB: 08/03/1951  Age: 72 y.o. MRN: [id number]  Chief Complaint  Patient presents with   Follow-up    Hospital follow up Still in quit a bit of pain     Patient comes in for emergency room follow-up.  She went to ED on 07/29/2023 with diagnosis of left lower quadrant pain.  Her workup including CT scan was unremarkable.  Patient was sent home on oral Bentyl for.  She continues to take it but reports that her pain has not resolved yet.  Her previous colonoscopy was also unremarkable.  She does not have any nausea or vomiting but mentions some loose stools.  Will try cholestyramine 1 packet p.o. daily.    No other concerns at this time.   Past Medical History:  Diagnosis Date   Arthritis    Depression    Essential hypertension, benign 10/09/2022   GERD (gastroesophageal reflux disease)    occ no meds   High cholesterol    Hypertension    Impaired glucose tolerance 10/09/2022   Iron deficiency anemia 10/09/2022   Vitamin D deficiency 10/09/2022    Past Surgical History:  Procedure Laterality Date   ANTERIOR CERVICAL DECOMP/DISCECTOMY FUSION N/A 07/08/2021   Procedure: C3-4 ANTERIOR CERVICAL DECOMPRESSION/DISCECTOMY FUSION 1 LEVEL WITH HEDRON;  Surgeon: Venetia Night, MD;  Location: [facility];  Service: Neurosurgery;  Laterality: N/A;   APPLICATION OF INTRAOPERATIVE CT SCAN N/A 09/19/2021   Procedure: APPLICATION OF INTRAOPERATIVE CT SCAN;  Surgeon: Venetia Night, MD;  Location: [facility];  Service: Neurosurgery;  Laterality: N/A;   APPLICATION OF WOUND VAC  09/19/2021   Procedure: APPLICATION OF WOUND VAC;  Surgeon: Venetia Night, MD;  Location: [facility];  Service: Neurosurgery;;  [facility]   BACK SURGERY     thoracic   CESAREAN SECTION  1971   CHOLECYSTECTOMY     COLONOSCOPY     ROTATOR CUFF REPAIR Right 2006   TOTAL KNEE ARTHROPLASTY Left 2014   TOTAL KNEE ARTHROPLASTY Right  09/11/2022   Procedure: TOTAL KNEE ARTHROPLASTY;  Surgeon: Reinaldo Berber, MD;  Location: [facility];  Service: Orthopedics;  Laterality: Right;   TUBAL LIGATION  1975    Social History   Socioeconomic History   Marital status: Married    Spouse name: Not on file   Number of children: Not on file   Years of education: Not on file   Highest education level: Not on file  Occupational History   Not on file  Tobacco Use   Smoking status: Never   Smokeless tobacco: Never  Vaping Use   Vaping status: Never Used  Substance and Sexual Activity   Alcohol use: Yes    Comment: rarely   Drug use: No   Sexual activity: Not on file  Other Topics Concern   Not on file  Social History Narrative   Not on file   Social Drivers of Health   Financial Resource Strain: Low Risk  (02/28/2021)   Received from [facility], [facility]   Overall Financial Resource Strain (CARDIA)    Difficulty of Paying Living Expenses: Not very hard  Food Insecurity: No Food Insecurity (09/11/2022)   Hunger Vital Sign    Worried About Running Out of Food in the Last Year: Never true    Ran Out of Food in the Last Year: Never true  Transportation Needs: No Transportation Needs (09/11/2022)   PRAPARE - Transportation  Lack of Transportation (Medical): No    Lack of Transportation (Non-Medical): No  Physical Activity: Not on file  Stress: Not on file  Social Connections: Not on file  Intimate Partner Violence: Not At Risk (09/11/2022)   Humiliation, Afraid, Rape, and Kick questionnaire    Fear of Current or Ex-Partner: No    Emotionally Abused: No    Physically Abused: No    Sexually Abused: No    History reviewed. No pertinent family history.  No Known Allergies  Outpatient Medications Prior to Visit  Medication Sig   acetaminophen (TYLENOL) 500 MG tablet Take 1,000 mg by mouth 3 (three) times daily.   amLODipine (NORVASC) 5 MG tablet Take 1 tablet (5 mg total) by mouth daily.   atenolol  (TENORMIN) 100 MG tablet TAKE 1 TABLET BY MOUTH IN THE  MORNING   clotrimazole-betamethasone (LOTRISONE) cream APPLY CREAM TOPICALLY TWICE DAILY   dicyclomine (BENTYL) 10 MG capsule Take 1 capsule (10 mg total) by mouth 3 (three) times daily before meals for 10 days.   DULoxetine (CYMBALTA) 60 MG capsule Take 1 capsule (60 mg total) by mouth in the morning.   Fe Fum-FA-B Cmp-C-Zn-Mg-Mn-Cu (HEMOCYTE-PLUS) 106-1 MG TABS Take 1 tablet by mouth daily.   gabapentin (NEURONTIN) 600 MG tablet TAKE 1 TABLET BY MOUTH TWICE  DAILY   hydrochlorothiazide (HYDRODIURIL) 25 MG tablet TAKE 1 TABLET BY MOUTH IN THE  MORNING   Lidocaine 4 % PTCH Place 1 patch onto the skin daily as needed (back pain).   losartan (COZAAR) 100 MG tablet TAKE 1 TABLET BY MOUTH AT  BEDTIME   pantoprazole (PROTONIX) 40 MG tablet Take 1 tablet (40 mg total) by mouth daily.   rosuvastatin (CRESTOR) 20 MG tablet Take 1 tablet (20 mg total) by mouth daily.   No facility-administered medications prior to visit.    Review of Systems  Constitutional: Negative.   HENT: Negative.    Eyes: Negative.   Respiratory: Negative.  Negative for cough and shortness of breath.   Cardiovascular: Negative.  Negative for chest pain, palpitations and leg swelling.  Gastrointestinal: Negative.  Negative for abdominal pain, blood in stool, constipation, diarrhea, heartburn, melena, nausea and vomiting.  Genitourinary: Negative.  Negative for dysuria and flank pain.  Musculoskeletal: Negative.  Negative for joint pain and myalgias.  Skin: Negative.   Neurological: Negative.  Negative for dizziness and headaches.  Endo/Heme/Allergies: Negative.   Psychiatric/Behavioral: Negative.  Negative for depression and suicidal ideas. The patient is not nervous/anxious.        Objective:   BP 134/87   Pulse 62   Ht 5\' 9"  (1.753 m)   Wt 255 lb (115.7 kg)   SpO2 99%   BMI 37.66 kg/m   Vitals:   08/07/23 1511  BP: 134/87  Pulse: 62  Height: 5\' 9"   (1.753 m)  Weight: 255 lb (115.7 kg)  SpO2: 99%  BMI (Calculated): 37.64    Physical Exam Vitals and nursing note reviewed.  Constitutional:      Appearance: Normal appearance.  HENT:     Head: Normocephalic and atraumatic.     Nose: Nose normal.     Mouth/Throat:     Mouth: Mucous membranes are moist.     Pharynx: Oropharynx is clear.  Eyes:     Conjunctiva/sclera: Conjunctivae normal.     Pupils: Pupils are equal, round, and reactive to light.  Cardiovascular:     Rate and Rhythm: Normal rate and regular rhythm.     Pulses: Normal  pulses.     Heart sounds: Normal heart sounds. No murmur heard. Pulmonary:     Effort: Pulmonary effort is normal.     Breath sounds: Normal breath sounds. No wheezing.  Abdominal:     General: Bowel sounds are normal.     Palpations: Abdomen is soft.     Tenderness: There is no abdominal tenderness. There is no right CVA tenderness or left CVA tenderness.  Musculoskeletal:        General: Normal range of motion.     Cervical back: Normal range of motion.     Right lower leg: No edema.     Left lower leg: No edema.  Skin:    General: Skin is warm and dry.  Neurological:     General: No focal deficit present.     Mental Status: She is alert and oriented to person, place, and time.  Psychiatric:        Mood and Affect: Mood normal.        Behavior: Behavior normal.      No results found for any visits on 08/07/23.  Recent Results (from the past 2160 hours)  POCT Urinalysis Dipstick (82956)     Status: Abnormal   Collection Time: 07/12/23 11:54 AM  Result Value Ref Range   Color, UA orange    Clarity, UA slightly cloudy    Glucose, UA Negative Negative   Bilirubin, UA negative    Ketones, UA negative    Spec Grav, UA 1.020 1.010 - 1.025   Blood, UA negative    pH, UA 6.0 5.0 - 8.0   Protein, UA Negative Negative   Urobilinogen, UA 0.2 0.2 or 1.0 E.U./dL   Nitrite, UA negative    Leukocytes, UA Negative Negative   Appearance  slightly cloudy    Odor none   CBC with Diff     Status: None   Collection Time: 07/12/23 11:56 AM  Result Value Ref Range   WBC 5.7 3.4 - 10.8 x10E3/uL    Comment: **Effective July 23, 2023 profile 005025 WBC will be made**   non-orderable as a stand-alone order code.    RBC 3.87 3.77 - 5.28 x10E6/uL   Hemoglobin 11.5 11.1 - 15.9 g/dL   Hematocrit 21.3 08.6 - 46.6 %   MCV 93 79 - 97 fL   MCH 29.7 26.6 - 33.0 pg   MCHC 32.0 31.5 - 35.7 g/dL   RDW 57.8 46.9 - 62.9 %   Platelets 251 150 - 450 x10E3/uL   Neutrophils 61 Not Estab. %   Lymphs 26 Not Estab. %   Monocytes 7 Not Estab. %   Eos 4 Not Estab. %   Basos 1 Not Estab. %   Neutrophils Absolute 3.5 1.4 - 7.0 x10E3/uL   Lymphocytes Absolute 1.5 0.7 - 3.1 x10E3/uL   Monocytes Absolute 0.4 0.1 - 0.9 x10E3/uL   EOS (ABSOLUTE) 0.2 0.0 - 0.4 x10E3/uL   Basophils Absolute 0.0 0.0 - 0.2 x10E3/uL   Immature Granulocytes 1 Not Estab. %   Immature Grans (Abs) 0.0 0.0 - 0.1 x10E3/uL  CMP14+EGFR     Status: Abnormal   Collection Time: 07/12/23 11:56 AM  Result Value Ref Range   Glucose 89 70 - 99 mg/dL   BUN 19 8 - 27 mg/dL   Creatinine, Ser 5.28 (H) 0.57 - 1.00 mg/dL   eGFR 50 (L) >41 LK/GMW/1.02   BUN/Creatinine Ratio 16 12 - 28   Sodium 143 134 - 144 mmol/L  Potassium 4.4 3.5 - 5.2 mmol/L   Chloride 101 96 - 106 mmol/L   CO2 26 20 - 29 mmol/L   Calcium 10.5 (H) 8.7 - 10.3 mg/dL   Total Protein 7.2 6.0 - 8.5 g/dL   Albumin 4.3 3.8 - 4.8 g/dL   Globulin, Total 2.9 1.5 - 4.5 g/dL   Bilirubin Total 0.2 0.0 - 1.2 mg/dL   Alkaline Phosphatase 72 44 - 121 IU/L   AST 35 0 - 40 IU/L   ALT 39 (H) 0 - 32 IU/L  Lipid Panel w/o Chol/HDL Ratio     Status: None   Collection Time: 07/12/23 11:56 AM  Result Value Ref Range   Cholesterol, Total 174 100 - 199 mg/dL   Triglycerides 829 0 - 149 mg/dL   HDL 70 >56 mg/dL   VLDL Cholesterol Cal 23 5 - 40 mg/dL   LDL Chol Calc (NIH) 81 0 - 99 mg/dL  Hemoglobin O1H     Status: Abnormal    Collection Time: 07/12/23 11:56 AM  Result Value Ref Range   Hgb A1c MFr Bld 6.3 (H) 4.8 - 5.6 %    Comment:          Prediabetes: 5.7 - 6.4          Diabetes: >6.4          Glycemic control for adults with diabetes: <7.0    Est. average glucose Bld gHb Est-mCnc 134 mg/dL  Lipase, blood     Status: None   Collection Time: 07/29/23  1:46 PM  Result Value Ref Range   Lipase 35 11 - 51 U/L    Comment: Performed at [facility], [address] [place], [place] [address]  Comprehensive metabolic panel     Status: Abnormal   Collection Time: 07/29/23  1:46 PM  Result Value Ref Range   Sodium 138 135 - 145 mmol/L   Potassium 4.1 3.5 - 5.1 mmol/L   Chloride 101 98 - 111 mmol/L   CO2 28 22 - 32 mmol/L   Glucose, Bld 103 (H) 70 - 99 mg/dL    Comment: Glucose reference range applies only to samples taken after fasting for at least 8 hours.   BUN 26 (H) 8 - 23 mg/dL   Creatinine, Ser 8.46 (H) 0.44 - 1.00 mg/dL   Calcium 9.8 8.9 - 96.2 mg/dL   Total Protein 7.8 6.5 - 8.1 g/dL   Albumin 4.1 3.5 - 5.0 g/dL   AST 32 15 - 41 U/L   ALT 35 0 - 44 U/L   Alkaline Phosphatase 61 38 - 126 U/L   Total Bilirubin 0.6 <1.2 mg/dL   GFR, Estimated 45 (L) >60 mL/min    Comment: (NOTE) Calculated using the CKD-EPI Creatinine Equation (2021)    Anion gap 9 5 - 15    Comment: Performed at [facility], [address] [place], [place] [address]  CBC     Status: Abnormal   Collection Time: 07/29/23  1:46 PM  Result Value Ref Range   WBC 6.1 4.0 - 10.5 K/uL   RBC 3.70 (L) 3.87 - 5.11 MIL/uL   Hemoglobin 11.2 (L) 12.0 - 15.0 g/dL   HCT 13.2 (L) 44.0 - 10.2 %   MCV 93.0 80.0 - 100.0 fL   MCH 30.3 26.0 - 34.0 pg   MCHC 32.6 30.0 - 36.0 g/dL   RDW 72.5 36.6 - 44.0 %   Platelets 211 150 - 400 K/uL   nRBC 0.0  0.0 - 0.2 %    Comment: Performed at [facility], [address] [place], [place] [address]  Urinalysis, Routine w reflex microscopic -Urine, Clean Catch     Status:  None   Collection Time: 07/29/23  6:47 PM  Result Value Ref Range   Color, Urine YELLOW YELLOW   APPearance CLEAR CLEAR   Specific Gravity, Urine 1.015 1.005 - 1.030   pH 5.5 5.0 - 8.0   Glucose, UA NEGATIVE NEGATIVE mg/dL   Hgb urine dipstick NEGATIVE NEGATIVE   Bilirubin Urine NEGATIVE NEGATIVE   Ketones, ur NEGATIVE NEGATIVE mg/dL   Protein, ur NEGATIVE NEGATIVE mg/dL   Nitrite NEGATIVE NEGATIVE   Leukocytes,Ua NEGATIVE NEGATIVE    Comment: Microscopic not done on urines with negative protein, blood, leukocytes, nitrite, or glucose < 500 mg/dL. Performed at [facility], [address], [place], [place] [address]       Assessment & Plan:  Patient to continue current meds.  Monitor her weight and use diet control. Trial of cholestyramine packet 1 a day.  If not better than follow-up with the GI. Problem List Items Addressed This Visit     Mixed hyperlipidemia   Relevant Medications   cholestyramine (QUESTRAN) 4 g packet   Essential hypertension, benign - Primary   Relevant Medications   cholestyramine (QUESTRAN) 4 g packet   Prediabetes   Gastroesophageal reflux disease without esophagitis   Other Visit Diagnoses       Diarrhea, unspecified type       Relevant Medications   cholestyramine (QUESTRAN) 4 g packet       Follow up as scheduled.  Total time spent: 30 minutes  Margaretann Loveless, MD  08/07/2023   This document may have been prepared by [employer] Voice Recognition software and as such may include unintentional dictation errors.

## 2023-08-08 ENCOUNTER — Telehealth: Payer: Self-pay | Admitting: Internal Medicine

## 2023-08-08 NOTE — Telephone Encounter (Signed)
Patient left VM stating the medication Dr. Welton Flakes sent her in is too expensive but did not state which medication it is.

## 2023-08-27 ENCOUNTER — Other Ambulatory Visit: Payer: Self-pay | Admitting: Internal Medicine

## 2023-08-29 ENCOUNTER — Other Ambulatory Visit: Payer: Self-pay | Admitting: Internal Medicine

## 2023-08-30 ENCOUNTER — Telehealth: Payer: Self-pay

## 2023-08-30 ENCOUNTER — Other Ambulatory Visit: Payer: Self-pay | Admitting: Internal Medicine

## 2023-08-30 MED ORDER — HYDROCHLOROTHIAZIDE 25 MG PO TABS: 25.0000 mg | ORAL_TABLET | Freq: Every morning | ORAL | 1 refills | Status: DC

## 2023-08-30 NOTE — Telephone Encounter (Signed)
 Pt Diana Green asking for call back she said she was returning a call but the last call I seen was from mid december

## 2023-09-06 ENCOUNTER — Encounter: Payer: Self-pay | Admitting: Internal Medicine

## 2023-09-06 ENCOUNTER — Ambulatory Visit (INDEPENDENT_AMBULATORY_CARE_PROVIDER_SITE_OTHER): Payer: Medicare Other | Admitting: Internal Medicine

## 2023-09-06 VITALS — BP 138/78 | HR 64 | Ht 69.0 in | Wt 265.4 lb

## 2023-09-06 DIAGNOSIS — E782 Mixed hyperlipidemia: Secondary | ICD-10-CM | POA: Diagnosis not present

## 2023-09-06 DIAGNOSIS — M25472 Effusion, left ankle: Secondary | ICD-10-CM

## 2023-09-06 DIAGNOSIS — R7303 Prediabetes: Secondary | ICD-10-CM | POA: Diagnosis not present

## 2023-09-06 DIAGNOSIS — R197 Diarrhea, unspecified: Secondary | ICD-10-CM

## 2023-09-06 DIAGNOSIS — M25471 Effusion, right ankle: Secondary | ICD-10-CM

## 2023-09-06 DIAGNOSIS — I10 Essential (primary) hypertension: Secondary | ICD-10-CM | POA: Diagnosis not present

## 2023-09-06 MED ORDER — FUROSEMIDE 20 MG PO TABS: 20.0000 mg | ORAL_TABLET | Freq: Every day | ORAL | 1 refills | Status: DC

## 2023-09-06 MED ORDER — CHOLESTYRAMINE 4 G PO PACK: 1.0000 | PACK | Freq: Two times a day (BID) | ORAL | 11 refills | Status: DC

## 2023-09-06 NOTE — Progress Notes (Signed)
Established Patient Office Visit  Subjective:  Patient ID: Diana Green, female    DOB: April 15, 1951  Age: 73 y.o. MRN: [id number]  Chief Complaint  Patient presents with   Acute Visit    Pain in side    Patient comes in with complaints of bilateral ankle edema.  She is already taking hydrochlorothiazide but noticed that her ankles started swelling about 2 weeks ago.  She had traveled by car for Christmas holidays, and then returned at which time she started noticing the swelling.  She has gained some weight as well.  There is no pain or tenderness in the calves, no redness and no rash.  She does not have any chest pain or shortness of breath. Will add Lasix 20 mg once a day for 1 week.  Will check her labs next week for potassium. She reports of still having loose bowel movements, but she has not started taking the cholestyramine packets yet.  Will pick them up today.    No other concerns at this time.   Past Medical History:  Diagnosis Date   Arthritis    Depression    Essential hypertension, benign 10/09/2022   GERD (gastroesophageal reflux disease)    occ no meds   High cholesterol    Hypertension    Impaired glucose tolerance 10/09/2022   Iron deficiency anemia 10/09/2022   Vitamin D deficiency 10/09/2022    Past Surgical History:  Procedure Laterality Date   ANTERIOR CERVICAL DECOMP/DISCECTOMY FUSION N/A 07/08/2021   Procedure: C3-4 ANTERIOR CERVICAL DECOMPRESSION/DISCECTOMY FUSION 1 LEVEL WITH HEDRON;  Surgeon: Venetia Night, MD;  Location: [facility];  Service: Neurosurgery;  Laterality: N/A;   APPLICATION OF INTRAOPERATIVE CT SCAN N/A 09/19/2021   Procedure: APPLICATION OF INTRAOPERATIVE CT SCAN;  Surgeon: Venetia Night, MD;  Location: [facility];  Service: Neurosurgery;  Laterality: N/A;   APPLICATION OF WOUND VAC  09/19/2021   Procedure: APPLICATION OF WOUND VAC;  Surgeon: Venetia Night, MD;  Location: [facility];  Service: Neurosurgery;;  [facility]   BACK  SURGERY     thoracic   CESAREAN SECTION  1971   CHOLECYSTECTOMY     COLONOSCOPY     ROTATOR CUFF REPAIR Right 2006   TOTAL KNEE ARTHROPLASTY Left 2014   TOTAL KNEE ARTHROPLASTY Right 09/11/2022   Procedure: TOTAL KNEE ARTHROPLASTY;  Surgeon: Reinaldo Berber, MD;  Location: [facility];  Service: Orthopedics;  Laterality: Right;   TUBAL LIGATION  1975    Social History   Socioeconomic History   Marital status: Married    Spouse name: Not on file   Number of children: Not on file   Years of education: Not on file   Highest education level: Not on file  Occupational History   Not on file  Tobacco Use   Smoking status: Never   Smokeless tobacco: Never  Vaping Use   Vaping status: Never Used  Substance and Sexual Activity   Alcohol use: Yes    Comment: rarely   Drug use: No   Sexual activity: Not on file  Other Topics Concern   Not on file  Social History Narrative   Not on file   Social Drivers of Health   Financial Resource Strain: Low Risk  (02/28/2021)   [facility], [facility]   Overall Financial Resource Strain (CARDIA)    Difficulty of Paying Living Expenses: Not very hard  Food Insecurity: No Food Insecurity (09/11/2022)   Hunger Vital Sign    Worried  About Running Out of Food in the Last Year: Never true    Ran Out of Food in the Last Year: Never true  Transportation Needs: No Transportation Needs (09/11/2022)   PRAPARE - Administrator, Civil Service (Medical): No    Lack of Transportation (Non-Medical): No  Physical Activity: Not on file  Stress: Not on file  Social Connections: Not on file  Intimate Partner Violence: Not At Risk (09/11/2022)   Humiliation, Afraid, Rape, and Kick questionnaire    Fear of Current or Ex-Partner: No    Emotionally Abused: No    Physically Abused: No    Sexually Abused: No    No family history on file.  No Known Allergies  Outpatient Medications Prior to Visit  Medication Sig    acetaminophen (TYLENOL) 500 MG tablet Take 1,000 mg by mouth 3 (three) times daily.   amLODipine (NORVASC) 5 MG tablet Take 1 tablet (5 mg total) by mouth daily.   atenolol (TENORMIN) 100 MG tablet TAKE 1 TABLET BY MOUTH IN THE  MORNING   clotrimazole-betamethasone (LOTRISONE) cream APPLY CREAM TOPICALLY TWICE DAILY   DULoxetine (CYMBALTA) 60 MG capsule TAKE 1 CAPSULE BY MOUTH IN THE  MORNING   Fe Fum-FA-B Cmp-C-Zn-Mg-Mn-Cu (HEMOCYTE-PLUS) 106-1 MG TABS Take 1 tablet by mouth daily.   gabapentin (NEURONTIN) 600 MG tablet TAKE 1 TABLET BY MOUTH TWICE  DAILY   hydrochlorothiazide (HYDRODIURIL) 25 MG tablet Take 1 tablet (25 mg total) by mouth every morning.   Lidocaine 4 % PTCH Place 1 patch onto the skin daily as needed (back pain).   losartan (COZAAR) 100 MG tablet TAKE 1 TABLET BY MOUTH AT  BEDTIME   pantoprazole (PROTONIX) 40 MG tablet Take 1 tablet (40 mg total) by mouth daily.   rosuvastatin (CRESTOR) 20 MG tablet Take 1 tablet (20 mg total) by mouth daily.   [DISCONTINUED] cholestyramine (QUESTRAN) 4 g packet Take 1 packet by mouth 2 (two) times daily.   dicyclomine (BENTYL) 10 MG capsule Take 1 capsule (10 mg total) by mouth 3 (three) times daily before meals for 10 days.   No facility-administered medications prior to visit.    Review of Systems  Constitutional: Negative.  Negative for chills, diaphoresis, fever and malaise/fatigue.  HENT: Negative.  Negative for congestion, hearing loss, nosebleeds, sinus pain and sore throat.   Eyes: Negative.   Respiratory: Negative.  Negative for cough and shortness of breath.   Cardiovascular: Negative.  Negative for chest pain, palpitations and leg swelling.  Gastrointestinal: Negative.  Negative for abdominal pain, constipation, diarrhea, heartburn, nausea and vomiting.  Genitourinary: Negative.  Negative for dysuria and flank pain.  Musculoskeletal: Negative.  Negative for joint pain and myalgias.  Skin: Negative.   Neurological: Negative.   Negative for dizziness, tingling, tremors, sensory change and headaches.  Endo/Heme/Allergies: Negative.   Psychiatric/Behavioral: Negative.  Negative for depression and suicidal ideas. The patient is not nervous/anxious.        Objective:   BP 138/78   Pulse 64   Ht 5\' 9"  (1.753 m)   Wt 265 lb 6.4 oz (120.4 kg)   SpO2 98%   BMI 39.19 kg/m   Vitals:   09/06/23 1038  BP: 138/78  Pulse: 64  Height: 5\' 9"  (1.753 m)  Weight: 265 lb 6.4 oz (120.4 kg)  SpO2: 98%  BMI (Calculated): 39.17    Physical Exam Vitals and nursing note reviewed.  Constitutional:      Appearance: Normal appearance.  HENT:  Head: Normocephalic and atraumatic.     Nose: Nose normal.     Mouth/Throat:     Mouth: Mucous membranes are moist.     Pharynx: Oropharynx is clear.  Eyes:     Conjunctiva/sclera: Conjunctivae normal.     Pupils: Pupils are equal, round, and reactive to light.  Cardiovascular:     Rate and Rhythm: Normal rate and regular rhythm.     Pulses: Normal pulses.     Heart sounds: Normal heart sounds. No murmur heard. Pulmonary:     Effort: Pulmonary effort is normal.     Breath sounds: Normal breath sounds. No wheezing.  Abdominal:     General: Bowel sounds are normal.     Palpations: Abdomen is soft.     Tenderness: There is no abdominal tenderness. There is no right CVA tenderness or left CVA tenderness.  Musculoskeletal:        General: Normal range of motion.     Cervical back: Normal range of motion.     Right lower leg: Edema present.     Left lower leg: Edema present.  Skin:    General: Skin is warm and dry.  Neurological:     General: No focal deficit present.     Mental Status: She is alert and oriented to person, place, and time.  Psychiatric:        Mood and Affect: Mood normal.        Behavior: Behavior normal.      No results found for any visits on 09/06/23.  Recent Results (from the past 2160 hours)  POCT Urinalysis Dipstick (01027)     Status:  Abnormal   Collection Time: 07/12/23 11:54 AM  Result Value Ref Range   Color, UA orange    Clarity, UA slightly cloudy    Glucose, UA Negative Negative   Bilirubin, UA negative    Ketones, UA negative    Spec Grav, UA 1.020 1.010 - 1.025   Blood, UA negative    pH, UA 6.0 5.0 - 8.0   Protein, UA Negative Negative   Urobilinogen, UA 0.2 0.2 or 1.0 E.U./dL   Nitrite, UA negative    Leukocytes, UA Negative Negative   Appearance slightly cloudy    Odor none   CBC with Diff     Status: None   Collection Time: 07/12/23 11:56 AM  Result Value Ref Range   WBC 5.7 3.4 - 10.8 x10E3/uL    Comment: **Effective July 23, 2023 profile 005025 WBC will be made**   non-orderable as a stand-alone order code.    RBC 3.87 3.77 - 5.28 x10E6/uL   Hemoglobin 11.5 11.1 - 15.9 g/dL   Hematocrit 25.3 66.4 - 46.6 %   MCV 93 79 - 97 fL   MCH 29.7 26.6 - 33.0 pg   MCHC 32.0 31.5 - 35.7 g/dL   RDW 40.3 47.4 - 25.9 %   Platelets 251 150 - 450 x10E3/uL   Neutrophils 61 Not Estab. %   Lymphs 26 Not Estab. %   Monocytes 7 Not Estab. %   Eos 4 Not Estab. %   Basos 1 Not Estab. %   Neutrophils Absolute 3.5 1.4 - 7.0 x10E3/uL   Lymphocytes Absolute 1.5 0.7 - 3.1 x10E3/uL   Monocytes Absolute 0.4 0.1 - 0.9 x10E3/uL   EOS (ABSOLUTE) 0.2 0.0 - 0.4 x10E3/uL   Basophils Absolute 0.0 0.0 - 0.2 x10E3/uL   Immature Granulocytes 1 Not Estab. %   Immature Grans (Abs) 0.0  0.0 - 0.1 x10E3/uL  CMP14+EGFR     Status: Abnormal   Collection Time: 07/12/23 11:56 AM  Result Value Ref Range   Glucose 89 70 - 99 mg/dL   BUN 19 8 - 27 mg/dL   Creatinine, Ser 4.09 (H) 0.57 - 1.00 mg/dL   eGFR 50 (L) >81 XB/JYN/8.29   BUN/Creatinine Ratio 16 12 - 28   Sodium 143 134 - 144 mmol/L   Potassium 4.4 3.5 - 5.2 mmol/L   Chloride 101 96 - 106 mmol/L   CO2 26 20 - 29 mmol/L   Calcium 10.5 (H) 8.7 - 10.3 mg/dL   Total Protein 7.2 6.0 - 8.5 g/dL   Albumin 4.3 3.8 - 4.8 g/dL   Globulin, Total 2.9 1.5 - 4.5 g/dL   Bilirubin  Total 0.2 0.0 - 1.2 mg/dL   Alkaline Phosphatase 72 44 - 121 IU/L   AST 35 0 - 40 IU/L   ALT 39 (H) 0 - 32 IU/L  Lipid Panel w/o Chol/HDL Ratio     Status: None   Collection Time: 07/12/23 11:56 AM  Result Value Ref Range   Cholesterol, Total 174 100 - 199 mg/dL   Triglycerides 562 0 - 149 mg/dL   HDL 70 >13 mg/dL   VLDL Cholesterol Cal 23 5 - 40 mg/dL   LDL Chol Calc (NIH) 81 0 - 99 mg/dL  Hemoglobin Y8M     Status: Abnormal   Collection Time: 07/12/23 11:56 AM  Result Value Ref Range   Hgb A1c MFr Bld 6.3 (H) 4.8 - 5.6 %    Comment:          Prediabetes: 5.7 - 6.4          Diabetes: >6.4          Glycemic control for adults with diabetes: <7.0    Est. average glucose Bld gHb Est-mCnc 134 mg/dL  Lipase, blood     Status: None   Collection Time: 07/29/23  1:46 PM  Result Value Ref Range   Lipase 35 11 - 51 U/L    Comment: Performed at [facility], [address] [place], [place] [address]  Comprehensive metabolic panel     Status: Abnormal   Collection Time: 07/29/23  1:46 PM  Result Value Ref Range   Sodium 138 135 - 145 mmol/L   Potassium 4.1 3.5 - 5.1 mmol/L   Chloride 101 98 - 111 mmol/L   CO2 28 22 - 32 mmol/L   Glucose, Bld 103 (H) 70 - 99 mg/dL    Comment: Glucose reference range applies only to samples taken after fasting for at least 8 hours.   BUN 26 (H) 8 - 23 mg/dL   Creatinine, Ser 9.62 (H) 0.44 - 1.00 mg/dL   Calcium 9.8 8.9 - 95.2 mg/dL   Total Protein 7.8 6.5 - 8.1 g/dL   Albumin 4.1 3.5 - 5.0 g/dL   AST 32 15 - 41 U/L   ALT 35 0 - 44 U/L   Alkaline Phosphatase 61 38 - 126 U/L   Total Bilirubin 0.6 <1.2 mg/dL   GFR, Estimated 45 (L) >60 mL/min    Comment: (NOTE) Calculated using the CKD-EPI Creatinine Equation (2021)    Anion gap 9 5 - 15    Comment: Performed at [facility], [address], [place], [place] [address]  CBC     Status: Abnormal   Collection Time: 07/29/23  1:46 PM  Result Value Ref Range   WBC 6.1  4.0 - 10.5  K/uL   RBC 3.70 (L) 3.87 - 5.11 MIL/uL   Hemoglobin 11.2 (L) 12.0 - 15.0 g/dL   HCT 65.7 (L) 84.6 - 96.2 %   MCV 93.0 80.0 - 100.0 fL   MCH 30.3 26.0 - 34.0 pg   MCHC 32.6 30.0 - 36.0 g/dL   RDW 95.2 84.1 - 32.4 %   Platelets 211 150 - 400 K/uL   nRBC 0.0 0.0 - 0.2 %    Comment: Performed at [facility], [address] [place], [place] [address]  Urinalysis, Routine w reflex microscopic -Urine, Clean Catch     Status: None   Collection Time: 07/29/23  6:47 PM  Result Value Ref Range   Color, Urine YELLOW YELLOW   APPearance CLEAR CLEAR   Specific Gravity, Urine 1.015 1.005 - 1.030   pH 5.5 5.0 - 8.0   Glucose, UA NEGATIVE NEGATIVE mg/dL   Hgb urine dipstick NEGATIVE NEGATIVE   Bilirubin Urine NEGATIVE NEGATIVE   Ketones, ur NEGATIVE NEGATIVE mg/dL   Protein, ur NEGATIVE NEGATIVE mg/dL   Nitrite NEGATIVE NEGATIVE   Leukocytes,Ua NEGATIVE NEGATIVE    Comment: Microscopic not done on urines with negative protein, blood, leukocytes, nitrite, or glucose < 500 mg/dL. Performed at [facility], [address], [place], [place] [address]       Assessment & Plan:  Add Lasix 20 mg/day for 1 week.  Monitor for any shortness of breath.  Will check labs at follow-up. Problem List Items Addressed This Visit     Mixed hyperlipidemia   Relevant Medications   cholestyramine (QUESTRAN) 4 g packet   furosemide (LASIX) 20 MG tablet   Essential hypertension, benign - Primary   Relevant Medications   cholestyramine (QUESTRAN) 4 g packet   furosemide (LASIX) 20 MG tablet   Prediabetes   Other Visit Diagnoses       Diarrhea, unspecified type       Relevant Medications   cholestyramine (QUESTRAN) 4 g packet     Ankle edema, bilateral           Return in about 1 week (around 09/13/2023).   Total time spent: 30 minutes  Margaretann Loveless, MD  09/06/2023   This document may have been prepared by [facility] Voice Recognition software and as such may include  unintentional dictation errors.

## 2023-09-07 ENCOUNTER — Telehealth: Payer: Self-pay | Admitting: Internal Medicine

## 2023-09-07 NOTE — Telephone Encounter (Signed)
Patient left VM that she cannot afford the medication that was sent in yesterday - it was $100. Wants something else called in.   Called patient back and it is the cholestyramine that she cannot afford. Please send alternative to [facility], if there is an alternative.

## 2023-09-13 ENCOUNTER — Ambulatory Visit: Payer: Medicare Other | Admitting: Internal Medicine

## 2023-09-17 ENCOUNTER — Encounter: Payer: Self-pay | Admitting: Internal Medicine

## 2023-09-17 ENCOUNTER — Ambulatory Visit (INDEPENDENT_AMBULATORY_CARE_PROVIDER_SITE_OTHER): Payer: Medicare Other | Admitting: Internal Medicine

## 2023-09-17 VITALS — BP 132/78 | HR 65 | Ht 69.0 in | Wt 259.0 lb

## 2023-09-17 DIAGNOSIS — E782 Mixed hyperlipidemia: Secondary | ICD-10-CM

## 2023-09-17 DIAGNOSIS — K219 Gastro-esophageal reflux disease without esophagitis: Secondary | ICD-10-CM

## 2023-09-17 DIAGNOSIS — R7303 Prediabetes: Secondary | ICD-10-CM

## 2023-09-17 DIAGNOSIS — I10 Essential (primary) hypertension: Secondary | ICD-10-CM

## 2023-09-17 DIAGNOSIS — R1012 Left upper quadrant pain: Secondary | ICD-10-CM

## 2023-09-17 NOTE — Progress Notes (Signed)
Established Patient Office Visit  Subjective:  Patient ID: Diana Green, female    DOB: 06-Jan-1951  Age: 73 y.o. MRN: [id number]  Chief Complaint  Patient presents with   Follow-up    1 week follow up    Patient comes in for her follow-up today.  She is feeling much better and does not have any further leg swelling.  She took Lasix daily and has also lost her fluid weight.  She is also wearing her compression stockings. Will check her potassium level today. Patient advised to put Lasix away, and can only take a tablet only if needed, once a week.   She already takes Hydrochlorothiazide along with her Losartan for blood pressure control. Patient continues to complain of left upper quadrant discomfort, so far she has had negative colonoscopy and CT abdomen.  Will send in a referral to a gastroenterologist.    No other concerns at this time.   Past Medical History:  Diagnosis Date   Arthritis    Depression    Essential hypertension, benign 10/09/2022   GERD (gastroesophageal reflux disease)    occ no meds   High cholesterol    Hypertension    Impaired glucose tolerance 10/09/2022   Iron deficiency anemia 10/09/2022   Vitamin D deficiency 10/09/2022    Past Surgical History:  Procedure Laterality Date   ANTERIOR CERVICAL DECOMP/DISCECTOMY FUSION N/A 07/08/2021   Procedure: C3-4 ANTERIOR CERVICAL DECOMPRESSION/DISCECTOMY FUSION 1 LEVEL WITH HEDRON;  Surgeon: Venetia Night, MD;  Location: [facility];  Service: Neurosurgery;  Laterality: N/A;   APPLICATION OF INTRAOPERATIVE CT SCAN N/A 09/19/2021   Procedure: APPLICATION OF INTRAOPERATIVE CT SCAN;  Surgeon: Venetia Night, MD;  Location: [facility];  Service: Neurosurgery;  Laterality: N/A;   APPLICATION OF WOUND VAC  09/19/2021   Procedure: APPLICATION OF WOUND VAC;  Surgeon: Venetia Night, MD;  Location: [facility];  Service: Neurosurgery;;  [facility]   BACK SURGERY     thoracic   CESAREAN SECTION  1971    CHOLECYSTECTOMY     COLONOSCOPY     ROTATOR CUFF REPAIR Right 2006   TOTAL KNEE ARTHROPLASTY Left 2014   TOTAL KNEE ARTHROPLASTY Right 09/11/2022   Procedure: TOTAL KNEE ARTHROPLASTY;  Surgeon: Reinaldo Berber, MD;  Location: [facility];  Service: Orthopedics;  Laterality: Right;   TUBAL LIGATION  1975    Social History   Socioeconomic History   Marital status: Married    Spouse name: Not on file   Number of children: Not on file   Years of education: Not on file   Highest education level: Not on file  Occupational History   Not on file  Tobacco Use   Smoking status: Never   Smokeless tobacco: Never  Vaping Use   Vaping status: Never Used  Substance and Sexual Activity   Alcohol use: Yes    Comment: rarely   Drug use: No   Sexual activity: Not on file  Other Topics Concern   Not on file  Social History Narrative   Not on file   Social Drivers of Health   Financial Resource Strain: Low Risk  (02/28/2021)   Received from [facility], [facility]   Overall Financial Resource Strain (CARDIA)    Difficulty of Paying Living Expenses: Not very hard  Food Insecurity: No Food Insecurity (09/11/2022)   Hunger Vital Sign    Worried About Running Out of Food in the Last Year: Never true    Ran Out of Food  in the Last Year: Never true  Transportation Needs: No Transportation Needs (09/11/2022)   PRAPARE - Administrator, Civil Service (Medical): No    Lack of Transportation (Non-Medical): No  Physical Activity: Not on file  Stress: Not on file  Social Connections: Not on file  Intimate Partner Violence: Not At Risk (09/11/2022)   Humiliation, Afraid, Rape, and Kick questionnaire    Fear of Current or Ex-Partner: No    Emotionally Abused: No    Physically Abused: No    Sexually Abused: No    History reviewed. No pertinent family history.  No Known Allergies  Outpatient Medications Prior to Visit  Medication Sig   acetaminophen (TYLENOL) 500 MG tablet  Take 1,000 mg by mouth 3 (three) times daily.   amLODipine (NORVASC) 5 MG tablet Take 1 tablet (5 mg total) by mouth daily.   atenolol (TENORMIN) 100 MG tablet TAKE 1 TABLET BY MOUTH IN THE  MORNING   cholestyramine (QUESTRAN) 4 g packet Take 1 packet by mouth 2 (two) times daily.   clotrimazole-betamethasone (LOTRISONE) cream APPLY CREAM TOPICALLY TWICE DAILY   DULoxetine (CYMBALTA) 60 MG capsule TAKE 1 CAPSULE BY MOUTH IN THE  MORNING   Fe Fum-FA-B Cmp-C-Zn-Mg-Mn-Cu (HEMOCYTE-PLUS) 106-1 MG TABS Take 1 tablet by mouth daily.   furosemide (LASIX) 20 MG tablet Take 1 tablet (20 mg total) by mouth daily.   gabapentin (NEURONTIN) 600 MG tablet TAKE 1 TABLET BY MOUTH TWICE  DAILY   hydrochlorothiazide (HYDRODIURIL) 25 MG tablet Take 1 tablet (25 mg total) by mouth every morning.   Lidocaine 4 % PTCH Place 1 patch onto the skin daily as needed (back pain).   losartan (COZAAR) 100 MG tablet TAKE 1 TABLET BY MOUTH AT  BEDTIME   pantoprazole (PROTONIX) 40 MG tablet Take 1 tablet (40 mg total) by mouth daily.   rosuvastatin (CRESTOR) 20 MG tablet Take 1 tablet (20 mg total) by mouth daily.   dicyclomine (BENTYL) 10 MG capsule Take 1 capsule (10 mg total) by mouth 3 (three) times daily before meals for 10 days.   No facility-administered medications prior to visit.    Review of Systems  Constitutional: Negative.  Negative for chills, fever, malaise/fatigue and weight loss.  HENT: Negative.  Negative for nosebleeds and sore throat.   Eyes: Negative.   Respiratory: Negative.  Negative for cough, shortness of breath and stridor.   Cardiovascular: Negative.  Negative for chest pain, palpitations and leg swelling.  Gastrointestinal:  Positive for abdominal pain. Negative for constipation, diarrhea, heartburn, nausea and vomiting.  Genitourinary: Negative.  Negative for dysuria and flank pain.  Musculoskeletal: Negative.  Negative for joint pain and myalgias.  Skin: Negative.   Neurological:  Negative  for dizziness, tremors and headaches.  Endo/Heme/Allergies: Negative.   Psychiatric/Behavioral: Negative.  Negative for depression and suicidal ideas. The patient is not nervous/anxious.        Objective:   BP 132/78   Pulse 65   Ht 5\' 9"  (1.753 m)   Wt 259 lb (117.5 kg)   SpO2 96%   BMI 38.25 kg/m   Vitals:   09/17/23 1054  BP: 132/78  Pulse: 65  Height: 5\' 9"  (1.753 m)  Weight: 259 lb (117.5 kg)  SpO2: 96%  BMI (Calculated): 38.23    Physical Exam Vitals and nursing note reviewed.  Constitutional:      Appearance: Normal appearance.  HENT:     Head: Normocephalic and atraumatic.     Nose: Nose normal.  Mouth/Throat:     Mouth: Mucous membranes are moist.     Pharynx: Oropharynx is clear.  Eyes:     Conjunctiva/sclera: Conjunctivae normal.     Pupils: Pupils are equal, round, and reactive to light.  Cardiovascular:     Rate and Rhythm: Normal rate and regular rhythm.     Pulses: Normal pulses.     Heart sounds: Normal heart sounds. No murmur heard. Pulmonary:     Effort: Pulmonary effort is normal.     Breath sounds: Normal breath sounds. No wheezing.  Abdominal:     General: Bowel sounds are normal.     Palpations: Abdomen is soft.     Tenderness: There is no abdominal tenderness. There is no right CVA tenderness or left CVA tenderness.  Musculoskeletal:        General: Normal range of motion.     Cervical back: Normal range of motion.     Right lower leg: No edema.     Left lower leg: No edema.  Skin:    General: Skin is warm and dry.  Neurological:     General: No focal deficit present.     Mental Status: She is alert and oriented to person, place, and time.  Psychiatric:        Mood and Affect: Mood normal.        Behavior: Behavior normal.      No results found for any visits on 09/17/23.  Recent Results (from the past 2160 hours)  POCT Urinalysis Dipstick (91478)     Status: Abnormal   Collection Time: 07/12/23 11:54 AM  Result Value  Ref Range   Color, UA orange    Clarity, UA slightly cloudy    Glucose, UA Negative Negative   Bilirubin, UA negative    Ketones, UA negative    Spec Grav, UA 1.020 1.010 - 1.025   Blood, UA negative    pH, UA 6.0 5.0 - 8.0   Protein, UA Negative Negative   Urobilinogen, UA 0.2 0.2 or 1.0 E.U./dL   Nitrite, UA negative    Leukocytes, UA Negative Negative   Appearance slightly cloudy    Odor none   CBC with Diff     Status: None   Collection Time: 07/12/23 11:56 AM  Result Value Ref Range   WBC 5.7 3.4 - 10.8 x10E3/uL    Comment: **Effective July 23, 2023 profile 005025 WBC will be made**   non-orderable as a stand-alone order code.    RBC 3.87 3.77 - 5.28 x10E6/uL   Hemoglobin 11.5 11.1 - 15.9 g/dL   Hematocrit 29.5 62.1 - 46.6 %   MCV 93 79 - 97 fL   MCH 29.7 26.6 - 33.0 pg   MCHC 32.0 31.5 - 35.7 g/dL   RDW 30.8 65.7 - 84.6 %   Platelets 251 150 - 450 x10E3/uL   Neutrophils 61 Not Estab. %   Lymphs 26 Not Estab. %   Monocytes 7 Not Estab. %   Eos 4 Not Estab. %   Basos 1 Not Estab. %   Neutrophils Absolute 3.5 1.4 - 7.0 x10E3/uL   Lymphocytes Absolute 1.5 0.7 - 3.1 x10E3/uL   Monocytes Absolute 0.4 0.1 - 0.9 x10E3/uL   EOS (ABSOLUTE) 0.2 0.0 - 0.4 x10E3/uL   Basophils Absolute 0.0 0.0 - 0.2 x10E3/uL   Immature Granulocytes 1 Not Estab. %   Immature Grans (Abs) 0.0 0.0 - 0.1 x10E3/uL  CMP14+EGFR     Status: Abnormal   Collection  Time: 07/12/23 11:56 AM  Result Value Ref Range   Glucose 89 70 - 99 mg/dL   BUN 19 8 - 27 mg/dL   Creatinine, Ser 0.98 (H) 0.57 - 1.00 mg/dL   eGFR 50 (L) >11 BJ/YNW/2.95   BUN/Creatinine Ratio 16 12 - 28   Sodium 143 134 - 144 mmol/L   Potassium 4.4 3.5 - 5.2 mmol/L   Chloride 101 96 - 106 mmol/L   CO2 26 20 - 29 mmol/L   Calcium 10.5 (H) 8.7 - 10.3 mg/dL   Total Protein 7.2 6.0 - 8.5 g/dL   Albumin 4.3 3.8 - 4.8 g/dL   Globulin, Total 2.9 1.5 - 4.5 g/dL   Bilirubin Total 0.2 0.0 - 1.2 mg/dL   Alkaline Phosphatase 72 44 - 121  IU/L   AST 35 0 - 40 IU/L   ALT 39 (H) 0 - 32 IU/L  Lipid Panel w/o Chol/HDL Ratio     Status: None   Collection Time: 07/12/23 11:56 AM  Result Value Ref Range   Cholesterol, Total 174 100 - 199 mg/dL   Triglycerides 621 0 - 149 mg/dL   HDL 70 >30 mg/dL   VLDL Cholesterol Cal 23 5 - 40 mg/dL   LDL Chol Calc (NIH) 81 0 - 99 mg/dL  Hemoglobin Q6V     Status: Abnormal   Collection Time: 07/12/23 11:56 AM  Result Value Ref Range   Hgb A1c MFr Bld 6.3 (H) 4.8 - 5.6 %    Comment:          Prediabetes: 5.7 - 6.4          Diabetes: >6.4          Glycemic control for adults with diabetes: <7.0    Est. average glucose Bld gHb Est-mCnc 134 mg/dL  Lipase, blood     Status: None   Collection Time: 07/29/23  1:46 PM  Result Value Ref Range   Lipase 35 11 - 51 U/L    Comment: Performed at [facility], [address] [place], [place] [address]  Comprehensive metabolic panel     Status: Abnormal   Collection Time: 07/29/23  1:46 PM  Result Value Ref Range   Sodium 138 135 - 145 mmol/L   Potassium 4.1 3.5 - 5.1 mmol/L   Chloride 101 98 - 111 mmol/L   CO2 28 22 - 32 mmol/L   Glucose, Bld 103 (H) 70 - 99 mg/dL    Comment: Glucose reference range applies only to samples taken after fasting for at least 8 hours.   BUN 26 (H) 8 - 23 mg/dL   Creatinine, Ser 6.29 (H) 0.44 - 1.00 mg/dL   Calcium 9.8 8.9 - 52.8 mg/dL   Total Protein 7.8 6.5 - 8.1 g/dL   Albumin 4.1 3.5 - 5.0 g/dL   AST 32 15 - 41 U/L   ALT 35 0 - 44 U/L   Alkaline Phosphatase 61 38 - 126 U/L   Total Bilirubin 0.6 <1.2 mg/dL   GFR, Estimated 45 (L) >60 mL/min    Comment: (NOTE) Calculated using the CKD-EPI Creatinine Equation (2021)    Anion gap 9 5 - 15    Comment: Performed at [facility], [address] [place], [place] [address]  CBC     Status: Abnormal   Collection Time: 07/29/23  1:46 PM  Result Value Ref Range   WBC 6.1 4.0 - 10.5 K/uL   RBC 3.70 (L) 3.87 - 5.11 MIL/uL  Hemoglobin 11.2 (L)  12.0 - 15.0 g/dL   HCT 16.1 (L) 09.6 - 04.5 %   MCV 93.0 80.0 - 100.0 fL   MCH 30.3 26.0 - 34.0 pg   MCHC 32.6 30.0 - 36.0 g/dL   RDW 40.9 81.1 - 91.4 %   Platelets 211 150 - 400 K/uL   nRBC 0.0 0.0 - 0.2 %    Comment: Performed at [facility], [address] [place], [place] [address]  Urinalysis, Routine w reflex microscopic -Urine, Clean Catch     Status: None   Collection Time: 07/29/23  6:47 PM  Result Value Ref Range   Color, Urine YELLOW YELLOW   APPearance CLEAR CLEAR   Specific Gravity, Urine 1.015 1.005 - 1.030   pH 5.5 5.0 - 8.0   Glucose, UA NEGATIVE NEGATIVE mg/dL   Hgb urine dipstick NEGATIVE NEGATIVE   Bilirubin Urine NEGATIVE NEGATIVE   Ketones, ur NEGATIVE NEGATIVE mg/dL   Protein, ur NEGATIVE NEGATIVE mg/dL   Nitrite NEGATIVE NEGATIVE   Leukocytes,Ua NEGATIVE NEGATIVE    Comment: Microscopic not done on urines with negative protein, blood, leukocytes, nitrite, or glucose < 500 mg/dL. Performed at [facility], [address] [place], [place] [address]       Assessment & Plan:  Continue current medication regimen. GI referral for abdominal discomfort. Problem List Items Addressed This Visit     Mixed hyperlipidemia   Essential hypertension, benign - Primary   Relevant Orders   Basic metabolic panel   Prediabetes   Gastroesophageal reflux disease without esophagitis   Other Visit Diagnoses       LUQ pain       Relevant Orders   Ambulatory referral to Gastroenterology       Follow up as scheduled.  Total time spent: 30 minutes  Margaretann Loveless, MD  09/17/2023   This document may have been prepared by [facility] Voice Recognition software and as such may include unintentional dictation errors.

## 2023-09-18 DIAGNOSIS — M79669 Pain in unspecified lower leg: Secondary | ICD-10-CM | POA: Diagnosis not present

## 2023-09-18 DIAGNOSIS — M545 Low back pain, unspecified: Secondary | ICD-10-CM | POA: Diagnosis not present

## 2023-09-18 DIAGNOSIS — R2681 Unsteadiness on feet: Secondary | ICD-10-CM | POA: Diagnosis not present

## 2023-09-18 LAB — BASIC METABOLIC PANEL
BUN/Creatinine Ratio: 21 (ref 12–28)
BUN: 24 mg/dL (ref 8–27)
CO2: 25 mmol/L (ref 20–29)
Calcium: 10.7 mg/dL — ABNORMAL HIGH (ref 8.7–10.3)
Chloride: 100 mmol/L (ref 96–106)
Creatinine, Ser: 1.17 mg/dL — ABNORMAL HIGH (ref 0.57–1.00)
Glucose: 94 mg/dL (ref 70–99)
Potassium: 4.1 mmol/L (ref 3.5–5.2)
Sodium: 141 mmol/L (ref 134–144)
eGFR: 50 mL/min/{1.73_m2} — ABNORMAL LOW (ref >59)

## 2023-09-18 NOTE — Progress Notes (Signed)
Patient notified

## 2023-09-20 DIAGNOSIS — R1012 Left upper quadrant pain: Secondary | ICD-10-CM | POA: Diagnosis not present

## 2023-09-26 DIAGNOSIS — R2681 Unsteadiness on feet: Secondary | ICD-10-CM | POA: Diagnosis not present

## 2023-09-26 DIAGNOSIS — M79669 Pain in unspecified lower leg: Secondary | ICD-10-CM | POA: Diagnosis not present

## 2023-09-26 DIAGNOSIS — M545 Low back pain, unspecified: Secondary | ICD-10-CM | POA: Diagnosis not present

## 2023-10-02 ENCOUNTER — Other Ambulatory Visit: Payer: Self-pay | Admitting: Internal Medicine

## 2023-10-02 DIAGNOSIS — L304 Erythema intertrigo: Secondary | ICD-10-CM

## 2023-10-03 ENCOUNTER — Other Ambulatory Visit: Payer: Self-pay | Admitting: Internal Medicine

## 2023-10-03 DIAGNOSIS — E782 Mixed hyperlipidemia: Secondary | ICD-10-CM

## 2023-10-03 DIAGNOSIS — I10 Essential (primary) hypertension: Secondary | ICD-10-CM

## 2023-10-04 MED ORDER — ROSUVASTATIN CALCIUM 20 MG PO TABS: 20.0000 mg | ORAL_TABLET | Freq: Every day | ORAL | 3 refills | Status: DC

## 2023-10-04 MED ORDER — AMLODIPINE BESYLATE 5 MG PO TABS: 5.0000 mg | ORAL_TABLET | Freq: Every day | ORAL | 3 refills | Status: DC

## 2023-10-24 ENCOUNTER — Ambulatory Visit (INDEPENDENT_AMBULATORY_CARE_PROVIDER_SITE_OTHER)

## 2023-10-24 ENCOUNTER — Encounter: Payer: Self-pay | Admitting: Podiatry

## 2023-10-24 ENCOUNTER — Ambulatory Visit: Payer: Medicare Other | Admitting: Podiatry

## 2023-10-24 DIAGNOSIS — M7752 Other enthesopathy of left foot: Secondary | ICD-10-CM

## 2023-10-24 NOTE — Progress Notes (Signed)
  Subjective:  Patient ID: Diana Green, female    DOB: 10/12/1950,  MRN: [id number]  Chief Complaint  Patient presents with   Foot Pain    "I have pain and swelling in my left ankle."    73 y.o. female presents with the above complaint. History confirmed with patient.  Previously seen Dr. Allena Katz for this for injections and this has been helpful last 1 was last fall  Objective:  Physical Exam: warm, good capillary refill, no trophic changes or ulcerative lesions, normal DP and PT pulses, normal sensory exam, and pain and swelling in anteromedial ankle along the anterior joint line.   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 Capsulitis of ankle, left      Plan:  Patient was evaluated and treated and all questions answered.  Reviewed her x-rays no major changes.  We discussed treatment options.  Previously has had help with injection therapy.  We discussed repeating this today.  Following consent and sterile prep with Betadine the left ankle was injected through the medial gutter through a anterior approach with 20 mg of Kenalog 4 L of dexamethasone and 1 cc of 0.5% Marcaine plain.  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  Return if symptoms worsen or fail to improve.

## 2023-10-25 ENCOUNTER — Ambulatory Visit: Payer: Medicare Other | Admitting: Internal Medicine

## 2023-12-03 ENCOUNTER — Ambulatory Visit (INDEPENDENT_AMBULATORY_CARE_PROVIDER_SITE_OTHER): Admitting: Internal Medicine

## 2023-12-03 ENCOUNTER — Encounter: Payer: Self-pay | Admitting: Internal Medicine

## 2023-12-03 VITALS — BP 128/76 | HR 66 | Ht 69.0 in | Wt 265.8 lb

## 2023-12-03 DIAGNOSIS — F411 Generalized anxiety disorder: Secondary | ICD-10-CM | POA: Insufficient documentation

## 2023-12-03 DIAGNOSIS — E782 Mixed hyperlipidemia: Secondary | ICD-10-CM | POA: Diagnosis not present

## 2023-12-03 DIAGNOSIS — I10 Essential (primary) hypertension: Secondary | ICD-10-CM | POA: Diagnosis not present

## 2023-12-03 DIAGNOSIS — K219 Gastro-esophageal reflux disease without esophagitis: Secondary | ICD-10-CM

## 2023-12-03 DIAGNOSIS — R7303 Prediabetes: Secondary | ICD-10-CM | POA: Diagnosis not present

## 2023-12-03 NOTE — Progress Notes (Signed)
 Established Patient Office Visit  Subjective:  Patient ID: Diana Green, female    DOB: 12-30-1950  Age: 73 y.o. MRN: [id number]  Chief Complaint  Patient presents with   Follow-up    Patient comes in for her follow-up today.  She is generally feeling well.  She was seen by gastroenterologist for the left upper quadrant pain and and was decided it was probably due to intestinal gas or musculoskeletal.  She was advised to be careful with her diet, avoid gas causing foods and also stop taking her iron supplement along with peppermint oil.  Patient has gained some weight.  She has stress at home related to her husband's health.  But she had also stopped using her Lasix intermittently.  Advised to resume 1 tablet twice a week.  Denies chest pain, no shortness of breath, no palpitations. Would like to see counselor/psychologist for stress at home.  She is taking her Cymbalta regularly.  Will send in a referral.    No other concerns at this time.   Past Medical History:  Diagnosis Date   Arthritis    Depression    Essential hypertension, benign 10/09/2022   GERD (gastroesophageal reflux disease)    occ no meds   High cholesterol    Hypertension    Impaired glucose tolerance 10/09/2022   Iron deficiency anemia 10/09/2022   Vitamin D deficiency 10/09/2022    Past Surgical History:  Procedure Laterality Date   ANTERIOR CERVICAL DECOMP/DISCECTOMY FUSION N/A 07/08/2021   Procedure: C3-4 ANTERIOR CERVICAL DECOMPRESSION/DISCECTOMY FUSION 1 LEVEL WITH HEDRON;  Surgeon: Venetia Night, MD;  Location: [facility];  Service: Neurosurgery;  Laterality: N/A;   APPLICATION OF INTRAOPERATIVE CT SCAN N/A 09/19/2021   Procedure: APPLICATION OF INTRAOPERATIVE CT SCAN;  Surgeon: Venetia Night, MD;  Location: [facility];  Service: Neurosurgery;  Laterality: N/A;   APPLICATION OF WOUND VAC  09/19/2021   Procedure: APPLICATION OF WOUND VAC;  Surgeon: Venetia Night, MD;  Location: [facility];   Service: Neurosurgery;;  [facility]   BACK SURGERY     thoracic   CESAREAN SECTION  1971   CHOLECYSTECTOMY     COLONOSCOPY     ROTATOR CUFF REPAIR Right 2006   TOTAL KNEE ARTHROPLASTY Left 2014   TOTAL KNEE ARTHROPLASTY Right 09/11/2022   Procedure: TOTAL KNEE ARTHROPLASTY;  Surgeon: Reinaldo Berber, MD;  Location: [facility];  Service: Orthopedics;  Laterality: Right;   TUBAL LIGATION  1975    Social History   Socioeconomic History   Marital status: Married    Spouse name: Not on file   Number of children: Not on file   Years of education: Not on file   Highest education level: Not on file  Occupational History   Not on file  Tobacco Use   Smoking status: Never   Smokeless tobacco: Never  Vaping Use   Vaping status: Never Used  Substance and Sexual Activity   Alcohol use: Not Currently    Comment: rarely   Drug use: No   Sexual activity: Not on file  Other Topics Concern   Not on file  Social History Narrative   Not on file   Social Drivers of Health   Financial Resource Strain: Low Risk  (02/28/2021)   Received from [facility], [facility]   Overall Financial Resource Strain (CARDIA)    Difficulty of Paying Living Expenses: Not very hard  Food Insecurity: No Food Insecurity (09/11/2022)   Hunger Vital Sign    Worried  About Running Out of Food in the Last Year: Never true    Ran Out of Food in the Last Year: Never true  Transportation Needs: No Transportation Needs (09/11/2022)   PRAPARE - Administrator, Civil Service (Medical): No    Lack of Transportation (Non-Medical): No  Physical Activity: Not on file  Stress: Not on file  Social Connections: Not on file  Intimate Partner Violence: Not At Risk (09/11/2022)   Humiliation, Afraid, Rape, and Kick questionnaire    Fear of Current or Ex-Partner: No    Emotionally Abused: No    Physically Abused: No    Sexually Abused: No    History reviewed. No pertinent family history.  No Known  Allergies  Outpatient Medications Prior to Visit  Medication Sig   acetaminophen (TYLENOL) 500 MG tablet Take 1,000 mg by mouth 3 (three) times daily.   amLODipine (NORVASC) 5 MG tablet Take 1 tablet (5 mg total) by mouth daily.   atenolol (TENORMIN) 100 MG tablet TAKE 1 TABLET BY MOUTH IN THE  MORNING   cholestyramine (QUESTRAN) 4 g packet Take 1 packet by mouth 2 (two) times daily.   clotrimazole-betamethasone (LOTRISONE) cream APPLY 1 APPLICATION TOPICALLY  TWICE DAILY   DULoxetine (CYMBALTA) 60 MG capsule TAKE 1 CAPSULE BY MOUTH IN THE  MORNING   Fe Fum-FA-B Cmp-C-Zn-Mg-Mn-Cu (HEMOCYTE-PLUS) 106-1 MG TABS Take 1 tablet by mouth daily.   furosemide (LASIX) 20 MG tablet Take 1 tablet (20 mg total) by mouth daily.   gabapentin (NEURONTIN) 600 MG tablet TAKE 1 TABLET BY MOUTH TWICE  DAILY   hydrochlorothiazide (HYDRODIURIL) 25 MG tablet Take 1 tablet (25 mg total) by mouth every morning.   Lidocaine 4 % PTCH Place 1 patch onto the skin daily as needed (back pain).   losartan (COZAAR) 100 MG tablet TAKE 1 TABLET BY MOUTH AT  BEDTIME   pantoprazole (PROTONIX) 40 MG tablet Take 1 tablet (40 mg total) by mouth daily.   rosuvastatin (CRESTOR) 20 MG tablet Take 1 tablet (20 mg total) by mouth daily.   [DISCONTINUED] dicyclomine (BENTYL) 10 MG capsule Take 1 capsule (10 mg total) by mouth 3 (three) times daily before meals for 10 days.   No facility-administered medications prior to visit.    Review of Systems  Constitutional: Negative.  Negative for chills, diaphoresis, fever, malaise/fatigue and weight loss.  HENT: Negative.  Negative for sore throat.   Eyes: Negative.   Respiratory: Negative.  Negative for cough and shortness of breath.   Cardiovascular: Negative.  Negative for chest pain, palpitations and leg swelling.  Gastrointestinal: Negative.  Negative for abdominal pain, blood in stool, constipation, diarrhea, heartburn, melena, nausea and vomiting.  Genitourinary: Negative.   Negative for dysuria and flank pain.  Musculoskeletal: Negative.  Negative for joint pain and myalgias.  Skin: Negative.   Neurological: Negative.  Negative for dizziness, tingling, tremors and headaches.  Endo/Heme/Allergies: Negative.   Psychiatric/Behavioral: Negative.  Negative for depression and suicidal ideas. The patient is not nervous/anxious.        Objective:   BP 128/76   Pulse 66   Ht 5\' 9"  (1.753 m)   Wt 265 lb 12.8 oz (120.6 kg)   SpO2 96%   BMI 39.25 kg/m   Vitals:   12/03/23 1030  BP: 128/76  Pulse: 66  Height: 5\' 9"  (1.753 m)  Weight: 265 lb 12.8 oz (120.6 kg)  SpO2: 96%  BMI (Calculated): 39.23    Physical Exam Vitals and nursing note  reviewed.  Constitutional:      Appearance: Normal appearance.  HENT:     Head: Normocephalic and atraumatic.     Nose: Nose normal.     Mouth/Throat:     Mouth: Mucous membranes are moist.     Pharynx: Oropharynx is clear.  Eyes:     Conjunctiva/sclera: Conjunctivae normal.     Pupils: Pupils are equal, round, and reactive to light.  Cardiovascular:     Rate and Rhythm: Normal rate and regular rhythm.     Pulses: Normal pulses.     Heart sounds: Normal heart sounds. No murmur heard. Pulmonary:     Effort: Pulmonary effort is normal.     Breath sounds: Normal breath sounds. No wheezing.  Chest:  Breasts:    Right: Normal. No swelling, bleeding, inverted nipple, mass, nipple discharge, skin change or tenderness.     Left: Normal. No swelling, bleeding, inverted nipple, mass, nipple discharge, skin change or tenderness.  Abdominal:     General: Bowel sounds are normal.     Palpations: Abdomen is soft.     Tenderness: There is no abdominal tenderness. There is no right CVA tenderness or left CVA tenderness.  Musculoskeletal:        General: Normal range of motion.     Cervical back: Normal range of motion.     Right lower leg: No edema.     Left lower leg: No edema.  Lymphadenopathy:     Upper Body:      Right upper body: No supraclavicular, axillary or pectoral adenopathy.     Left upper body: No supraclavicular, axillary or pectoral adenopathy.  Skin:    General: Skin is warm and dry.  Neurological:     General: No focal deficit present.     Mental Status: She is alert and oriented to person, place, and time.  Psychiatric:        Mood and Affect: Mood normal.        Behavior: Behavior normal.      No results found for any visits on 12/03/23.  Recent Results (from the past 2160 hours)  Basic metabolic panel     Status: Abnormal   Collection Time: 09/17/23 11:33 AM  Result Value Ref Range   Glucose 94 70 - 99 mg/dL   BUN 24 8 - 27 mg/dL   Creatinine, Ser 1.61 (H) 0.57 - 1.00 mg/dL   eGFR 50 (L) >09 UE/AVW/0.98   BUN/Creatinine Ratio 21 12 - 28   Sodium 141 134 - 144 mmol/L   Potassium 4.1 3.5 - 5.2 mmol/L   Chloride 100 96 - 106 mmol/L   CO2 25 20 - 29 mmol/L   Calcium 10.7 (H) 8.7 - 10.3 mg/dL      Assessment & Plan:  Patient advised to continue taking all her medications.  Check labs today.  Referral sent to psychologist. Problem List Items Addressed This Visit     Mixed hyperlipidemia   Relevant Orders   Lipid Panel w/o Chol/HDL Ratio   Essential hypertension, benign   Relevant Orders   CMP14+EGFR   Prediabetes   Relevant Orders   Hemoglobin A1c   Gastroesophageal reflux disease without esophagitis - Primary   Relevant Orders   CBC with Diff   Other Visit Diagnoses       GAD (generalized anxiety disorder)       Relevant Orders   Ambulatory referral to Psychology       Return in about 3 months (around 03/03/2024).  Total time spent: 30 minutes  Aisha Hove, MD  12/03/2023   This document may have been prepared by [facility] Voice Recognition software and as such may include unintentional dictation errors. l

## 2023-12-04 LAB — CBC WITH DIFFERENTIAL/PLATELET
Basophils Absolute: 0.1 10*3/uL (ref 0.0–0.2)
Basos: 1 %
EOS (ABSOLUTE): 0.3 10*3/uL (ref 0.0–0.4)
Eos: 4 %
Hematocrit: 35.6 % (ref 34.0–46.6)
Hemoglobin: 11.3 g/dL (ref 11.1–15.9)
Immature Grans (Abs): 0 10*3/uL (ref 0.0–0.1)
Immature Granulocytes: 0 %
Lymphocytes Absolute: 1.9 10*3/uL (ref 0.7–3.1)
Lymphs: 28 %
MCH: 29.2 pg (ref 26.6–33.0)
MCHC: 31.7 g/dL (ref 31.5–35.7)
MCV: 92 fL (ref 79–97)
Monocytes Absolute: 0.6 10*3/uL (ref 0.1–0.9)
Monocytes: 10 %
Neutrophils Absolute: 3.8 10*3/uL (ref 1.4–7.0)
Neutrophils: 57 %
Platelets: 257 10*3/uL (ref 150–450)
RBC: 3.87 x10E6/uL (ref 3.77–5.28)
RDW: 12.8 % (ref 11.7–15.4)
WBC: 6.6 10*3/uL (ref 3.4–10.8)

## 2023-12-04 LAB — LIPID PANEL W/O CHOL/HDL RATIO
Cholesterol, Total: 176 mg/dL (ref 100–199)
HDL: 60 mg/dL (ref >39)
LDL Chol Calc (NIH): 83 mg/dL (ref 0–99)
Triglycerides: 198 mg/dL — ABNORMAL HIGH (ref 0–149)
VLDL Cholesterol Cal: 33 mg/dL (ref 5–40)

## 2023-12-04 LAB — CMP14+EGFR
ALT: 25 IU/L (ref 0–32)
AST: 27 IU/L (ref 0–40)
Albumin: 4.4 g/dL (ref 3.8–4.8)
Alkaline Phosphatase: 72 IU/L (ref 44–121)
BUN/Creatinine Ratio: 17 (ref 12–28)
BUN: 19 mg/dL (ref 8–27)
Bilirubin Total: 0.3 mg/dL (ref 0.0–1.2)
CO2: 28 mmol/L (ref 20–29)
Calcium: 10.5 mg/dL — ABNORMAL HIGH (ref 8.7–10.3)
Chloride: 102 mmol/L (ref 96–106)
Creatinine, Ser: 1.11 mg/dL — ABNORMAL HIGH (ref 0.57–1.00)
Globulin, Total: 2.9 g/dL (ref 1.5–4.5)
Glucose: 94 mg/dL (ref 70–99)
Potassium: 4.4 mmol/L (ref 3.5–5.2)
Sodium: 143 mmol/L (ref 134–144)
Total Protein: 7.3 g/dL (ref 6.0–8.5)
eGFR: 53 mL/min/{1.73_m2} — ABNORMAL LOW (ref >59)

## 2023-12-04 LAB — HEMOGLOBIN A1C
Est. average glucose Bld gHb Est-mCnc: 131 mg/dL
Hgb A1c MFr Bld: 6.2 % — ABNORMAL HIGH (ref 4.8–5.6)

## 2023-12-05 ENCOUNTER — Telehealth: Payer: Self-pay | Admitting: Internal Medicine

## 2023-12-05 NOTE — Telephone Encounter (Signed)
 Entered in error

## 2023-12-19 DIAGNOSIS — G8929 Other chronic pain: Secondary | ICD-10-CM | POA: Diagnosis not present

## 2023-12-19 DIAGNOSIS — M5441 Lumbago with sciatica, right side: Secondary | ICD-10-CM | POA: Diagnosis not present

## 2023-12-19 DIAGNOSIS — M5442 Lumbago with sciatica, left side: Secondary | ICD-10-CM | POA: Diagnosis not present

## 2023-12-19 DIAGNOSIS — M5416 Radiculopathy, lumbar region: Secondary | ICD-10-CM | POA: Diagnosis not present

## 2024-01-03 DIAGNOSIS — M5416 Radiculopathy, lumbar region: Secondary | ICD-10-CM | POA: Diagnosis not present

## 2024-01-07 ENCOUNTER — Ambulatory Visit

## 2024-01-08 ENCOUNTER — Encounter: Payer: Self-pay | Admitting: Cardiology

## 2024-01-08 ENCOUNTER — Ambulatory Visit (INDEPENDENT_AMBULATORY_CARE_PROVIDER_SITE_OTHER): Admitting: Cardiology

## 2024-01-08 VITALS — BP 122/64 | HR 67 | Ht 69.0 in | Wt 259.0 lb

## 2024-01-08 DIAGNOSIS — J069 Acute upper respiratory infection, unspecified: Secondary | ICD-10-CM | POA: Diagnosis not present

## 2024-01-08 DIAGNOSIS — R7303 Prediabetes: Secondary | ICD-10-CM

## 2024-01-08 DIAGNOSIS — Z0130 Encounter for examination of blood pressure without abnormal findings: Secondary | ICD-10-CM

## 2024-01-08 DIAGNOSIS — R051 Acute cough: Secondary | ICD-10-CM | POA: Diagnosis not present

## 2024-01-08 MED ORDER — AZITHROMYCIN 250 MG PO TABS
ORAL_TABLET | ORAL | 0 refills | Status: AC
Start: 2024-01-08 — End: 2024-01-13

## 2024-01-08 MED ORDER — BENZONATATE 100 MG PO CAPS: 100.0000 mg | ORAL_CAPSULE | Freq: Three times a day (TID) | ORAL | 1 refills | Status: DC | PRN

## 2024-01-08 NOTE — Patient Instructions (Signed)
 Zpack Tessalon pearls for cough Mucinex Drink plenty of water  Rest

## 2024-01-08 NOTE — Progress Notes (Signed)
 Established Patient Office Visit  Subjective:  Patient ID: Diana Green, female    DOB: [birth year]  Age: 73 y.o. MRN: [id number]  Chief Complaint  Patient presents with   Acute Visit    Cough, Congestion since Sunday    Patient in office for an acute visit, complaining of cough and congestion for the past 2 days. Patient also reports shortness of breath, sore throat, nasal congestion and PND. Denies rhinorrhea and fever. Tried throat lozenges and Tylenol  with no relief. Will send in Zpack, Tessalon pearls, Recommend Mucinex, drink plenty of water  and rest.  Cough This is a new problem. The current episode started in the past 7 days. The problem has been unchanged. Associated symptoms include nasal congestion, postnasal drip, a sore throat and shortness of breath. Pertinent negatives include no chest pain, fever, headaches, myalgias or rhinorrhea. The symptoms are aggravated by lying down. She has tried nothing for the symptoms. The treatment provided no relief.    No other concerns at this time.   Past Medical History:  Diagnosis Date   Arthritis    Depression    Essential hypertension, benign 10/09/2022   GERD (gastroesophageal reflux disease)    occ no meds   High cholesterol    Hypertension    Impaired glucose tolerance 10/09/2022   Iron deficiency anemia 10/09/2022   Vitamin D  deficiency 10/09/2022    Past Surgical History:  Procedure Laterality Date   ANTERIOR CERVICAL DECOMP/DISCECTOMY FUSION N/A 07/08/2021   Procedure: C3-4 ANTERIOR CERVICAL DECOMPRESSION/DISCECTOMY FUSION 1 LEVEL WITH HEDRON;  Surgeon: Jodeen Munch, MD;  Location: [facility];  Service: Neurosurgery;  Laterality: N/A;   APPLICATION OF INTRAOPERATIVE CT SCAN N/A 09/19/2021   Procedure: APPLICATION OF INTRAOPERATIVE CT SCAN;  Surgeon: Jodeen Munch, MD;  Location: [facility];  Service: Neurosurgery;  Laterality: N/A;   APPLICATION OF WOUND VAC  09/19/2021   Procedure: APPLICATION OF WOUND VAC;   Surgeon: Jodeen Munch, MD;  Location: [facility];  Service: Neurosurgery;;  [facility]   BACK SURGERY     thoracic   CESAREAN SECTION  1971   CHOLECYSTECTOMY     COLONOSCOPY     ROTATOR CUFF REPAIR Right 2006   TOTAL KNEE ARTHROPLASTY Left 2014   TOTAL KNEE ARTHROPLASTY Right 09/11/2022   Procedure: TOTAL KNEE ARTHROPLASTY;  Surgeon: Venus Ginsberg, MD;  Location: [facility];  Service: Orthopedics;  Laterality: Right;   TUBAL LIGATION  1975    Social History   Socioeconomic History   Marital status: Married    Spouse name: Not on file   Number of children: Not on file   Years of education: Not on file   Highest education level: Not on file  Occupational History   Not on file  Tobacco Use   Smoking status: Never   Smokeless tobacco: Never  Vaping Use   Vaping status: Never Used  Substance and Sexual Activity   Alcohol use: Not Currently    Comment: rarely   Drug use: No   Sexual activity: Not on file  Other Topics Concern   Not on file  Social History Narrative   Not on file   Social Drivers of Health   Financial Resource Strain: Low Risk  (02/28/2021)   Received from [facility]   Overall Financial Resource Strain (CARDIA)    Difficulty of Paying Living Expenses: Not very hard  Food Insecurity: No Food Insecurity (09/11/2022)   Hunger Vital Sign    Worried About Running Out  of Food in the Last Year: Never true    Ran Out of Food in the Last Year: Never true  Transportation Needs: No Transportation Needs (09/11/2022)   PRAPARE - Administrator, Civil Service (Medical): No    Lack of Transportation (Non-Medical): No  Physical Activity: Not on file  Stress: Not on file  Social Connections: Not on file  Intimate Partner Violence: Not At Risk (09/11/2022)   Humiliation, Afraid, Rape, and Kick questionnaire    Fear of Current or Ex-Partner: No    Emotionally Abused: No    Physically Abused: No    Sexually Abused: No    History  reviewed. No pertinent family history.  No Known Allergies  Outpatient Medications Prior to Visit  Medication Sig   acetaminophen  (TYLENOL ) 500 MG tablet Take 1,000 mg by mouth 3 (three) times daily.   amLODipine  (NORVASC ) 5 MG tablet Take 1 tablet (5 mg total) by mouth daily.   atenolol  (TENORMIN ) 100 MG tablet TAKE 1 TABLET BY MOUTH IN THE  MORNING   cholestyramine  (QUESTRAN ) 4 g packet Take 1 packet by mouth 2 (two) times daily.   clotrimazole -betamethasone  (LOTRISONE ) cream APPLY 1 APPLICATION TOPICALLY  TWICE DAILY   DULoxetine  (CYMBALTA ) 60 MG capsule TAKE 1 CAPSULE BY MOUTH IN THE  MORNING   Fe Fum-FA-B Cmp-C-Zn-Mg-Mn-Cu (HEMOCYTE-PLUS) 106-1 MG TABS Take 1 tablet by mouth daily.   furosemide  (LASIX ) 20 MG tablet Take 1 tablet (20 mg total) by mouth daily.   gabapentin  (NEURONTIN ) 600 MG tablet TAKE 1 TABLET BY MOUTH TWICE  DAILY   hydrochlorothiazide  (HYDRODIURIL ) 25 MG tablet Take 1 tablet (25 mg total) by mouth every morning.   Lidocaine  4 % PTCH Place 1 patch onto the skin daily as needed (back pain).   losartan  (COZAAR ) 100 MG tablet TAKE 1 TABLET BY MOUTH AT  BEDTIME   pantoprazole  (PROTONIX ) 40 MG tablet Take 1 tablet (40 mg total) by mouth daily.   rosuvastatin  (CRESTOR ) 20 MG tablet Take 1 tablet (20 mg total) by mouth daily.   No facility-administered medications prior to visit.    Review of Systems  Constitutional: Negative.  Negative for fever.  HENT:  Positive for congestion, postnasal drip and sore throat. Negative for rhinorrhea and sinus pain.   Eyes: Negative.   Respiratory:  Positive for cough, sputum production and shortness of breath.   Cardiovascular: Negative.  Negative for chest pain.  Gastrointestinal: Negative.  Negative for abdominal pain, constipation and diarrhea.  Genitourinary: Negative.   Musculoskeletal:  Negative for joint pain and myalgias.  Skin: Negative.   Neurological: Negative.  Negative for dizziness and headaches.   Endo/Heme/Allergies: Negative.   All other systems reviewed and are negative.      Objective:   BP 122/64   Pulse 67   Ht 5\' 9"  (1.753 m)   Wt 259 lb (117.5 kg)   SpO2 98%   BMI 38.25 kg/m   Vitals:   01/08/24 1014  BP: 122/64  Pulse: 67  Height: 5\' 9"  (1.753 m)  Weight: 259 lb (117.5 kg)  SpO2: 98%  BMI (Calculated): 38.23    Physical Exam Vitals and nursing note reviewed.  Constitutional:      Appearance: Normal appearance. She is normal weight.  HENT:     Head: Normocephalic and atraumatic.     Nose: Nose normal.     Mouth/Throat:     Mouth: Mucous membranes are moist.  Eyes:     Extraocular Movements: Extraocular movements intact.  Conjunctiva/sclera: Conjunctivae normal.     Pupils: Pupils are equal, round, and reactive to light.  Cardiovascular:     Rate and Rhythm: Normal rate and regular rhythm.     Pulses: Normal pulses.     Heart sounds: Normal heart sounds.  Pulmonary:     Effort: Pulmonary effort is normal. No respiratory distress.     Breath sounds: Normal breath sounds. No stridor. No wheezing, rhonchi or rales.  Abdominal:     General: Abdomen is flat. Bowel sounds are normal.     Palpations: Abdomen is soft.  Musculoskeletal:        General: Normal range of motion.     Cervical back: Normal range of motion.  Skin:    General: Skin is warm and dry.  Neurological:     General: No focal deficit present.     Mental Status: She is alert and oriented to person, place, and time.  Psychiatric:        Mood and Affect: Mood normal.        Behavior: Behavior normal.        Thought Content: Thought content normal.        Judgment: Judgment normal.      No results found for any visits on 01/08/24.  Recent Results (from the past 2160 hours)  Lipid Panel w/o Chol/HDL Ratio     Status: Abnormal   Collection Time: 12/03/23 11:03 AM  Result Value Ref Range   Cholesterol, Total 176 100 - 199 mg/dL   Triglycerides 161 (H) 0 - 149 mg/dL   HDL  60 >09 mg/dL   VLDL Cholesterol Cal 33 5 - 40 mg/dL   LDL Chol Calc (NIH) 83 0 - 99 mg/dL  CBC with Diff     Status: None   Collection Time: 12/03/23 11:03 AM  Result Value Ref Range   WBC 6.6 3.4 - 10.8 x10E3/uL   RBC 3.87 3.77 - 5.28 x10E6/uL   Hemoglobin 11.3 11.1 - 15.9 g/dL   Hematocrit 60.4 54.0 - 46.6 %   MCV 92 79 - 97 fL   MCH 29.2 26.6 - 33.0 pg   MCHC 31.7 31.5 - 35.7 g/dL   RDW 98.1 19.1 - 47.8 %   Platelets 257 150 - 450 x10E3/uL   Neutrophils 57 Not Estab. %   Lymphs 28 Not Estab. %   Monocytes 10 Not Estab. %   Eos 4 Not Estab. %   Basos 1 Not Estab. %   Neutrophils Absolute 3.8 1.4 - 7.0 x10E3/uL   Lymphocytes Absolute 1.9 0.7 - 3.1 x10E3/uL   Monocytes Absolute 0.6 0.1 - 0.9 x10E3/uL   EOS (ABSOLUTE) 0.3 0.0 - 0.4 x10E3/uL   Basophils Absolute 0.1 0.0 - 0.2 x10E3/uL   Immature Granulocytes 0 Not Estab. %   Immature Grans (Abs) 0.0 0.0 - 0.1 x10E3/uL  CMP14+EGFR     Status: Abnormal   Collection Time: 12/03/23 11:03 AM  Result Value Ref Range   Glucose 94 70 - 99 mg/dL   BUN 19 8 - 27 mg/dL   Creatinine, Ser 2.95 (H) 0.57 - 1.00 mg/dL   eGFR 53 (L) >62 ZH/YQM/5.78   BUN/Creatinine Ratio 17 12 - 28   Sodium 143 134 - 144 mmol/L   Potassium 4.4 3.5 - 5.2 mmol/L   Chloride 102 96 - 106 mmol/L   CO2 28 20 - 29 mmol/L   Calcium  10.5 (H) 8.7 - 10.3 mg/dL   Total Protein 7.3 6.0 - 8.5 g/dL  Albumin 4.4 3.8 - 4.8 g/dL   Globulin, Total 2.9 1.5 - 4.5 g/dL   Bilirubin Total 0.3 0.0 - 1.2 mg/dL   Alkaline Phosphatase 72 44 - 121 IU/L   AST 27 0 - 40 IU/L   ALT 25 0 - 32 IU/L  Hemoglobin A1c     Status: Abnormal   Collection Time: 12/03/23 11:03 AM  Result Value Ref Range   Hgb A1c MFr Bld 6.2 (H) 4.8 - 5.6 %    Comment:          Prediabetes: 5.7 - 6.4          Diabetes: >6.4          Glycemic control for adults with diabetes: <7.0    Est. average glucose Bld gHb Est-mCnc 131 mg/dL      Assessment & Plan:  Zpack Tessalon pearls for  cough Mucinex Drink plenty of water  Rest  Problem List Items Addressed This Visit       Respiratory   Acute URI - Primary   Relevant Medications   azithromycin (ZITHROMAX Z-PAK) 250 MG tablet    Return if symptoms worsen or fail to improve.   Total time spent: 25 minutes  [employer], NP  01/08/2024   This document may have been prepared by Dragon Voice Recognition software and as such may include unintentional dictation errors.

## 2024-01-23 DIAGNOSIS — G8929 Other chronic pain: Secondary | ICD-10-CM | POA: Diagnosis not present

## 2024-01-23 DIAGNOSIS — M5441 Lumbago with sciatica, right side: Secondary | ICD-10-CM | POA: Diagnosis not present

## 2024-01-23 DIAGNOSIS — M5442 Lumbago with sciatica, left side: Secondary | ICD-10-CM | POA: Diagnosis not present

## 2024-01-23 DIAGNOSIS — M5416 Radiculopathy, lumbar region: Secondary | ICD-10-CM | POA: Diagnosis not present

## 2024-01-28 ENCOUNTER — Emergency Department
Admission: EM | Admit: 2024-01-28 | Discharge: 2024-01-29 | Disposition: A | Discharge status: Home / Self Care | Attending: Emergency Medicine | Admitting: Emergency Medicine

## 2024-01-28 ENCOUNTER — Emergency Department

## 2024-01-28 ENCOUNTER — Other Ambulatory Visit: Payer: Self-pay

## 2024-01-28 DIAGNOSIS — E876 Hypokalemia: Secondary | ICD-10-CM | POA: Diagnosis not present

## 2024-01-28 DIAGNOSIS — R42 Dizziness and giddiness: Secondary | ICD-10-CM | POA: Insufficient documentation

## 2024-01-28 DIAGNOSIS — R404 Transient alteration of awareness: Secondary | ICD-10-CM | POA: Diagnosis not present

## 2024-01-28 DIAGNOSIS — Z743 Need for continuous supervision: Secondary | ICD-10-CM | POA: Diagnosis not present

## 2024-01-28 DIAGNOSIS — I10 Essential (primary) hypertension: Secondary | ICD-10-CM | POA: Diagnosis not present

## 2024-01-28 DIAGNOSIS — I6782 Cerebral ischemia: Secondary | ICD-10-CM | POA: Diagnosis not present

## 2024-01-28 DIAGNOSIS — G4489 Other headache syndrome: Secondary | ICD-10-CM | POA: Diagnosis not present

## 2024-01-28 DIAGNOSIS — R6889 Other general symptoms and signs: Secondary | ICD-10-CM | POA: Diagnosis not present

## 2024-01-28 LAB — URINALYSIS, ROUTINE W REFLEX MICROSCOPIC
Bilirubin Urine: NEGATIVE
Glucose, UA: NEGATIVE mg/dL
Hgb urine dipstick: NEGATIVE
Ketones, ur: NEGATIVE mg/dL
Leukocytes,Ua: NEGATIVE
Nitrite: NEGATIVE
Protein, ur: NEGATIVE mg/dL
Specific Gravity, Urine: 1.006 (ref 1.005–1.030)
pH: 7 (ref 5.0–8.0)

## 2024-01-28 LAB — COMPREHENSIVE METABOLIC PANEL WITH GFR
ALT: 23 U/L (ref 0–44)
AST: 26 U/L (ref 15–41)
Albumin: 3.7 g/dL (ref 3.5–5.0)
Alkaline Phosphatase: 46 U/L (ref 38–126)
Anion gap: 7 (ref 5–15)
BUN: 23 mg/dL (ref 8–23)
CO2: 28 mmol/L (ref 22–32)
Calcium: 9.4 mg/dL (ref 8.9–10.3)
Chloride: 103 mmol/L (ref 98–111)
Creatinine, Ser: 0.99 mg/dL (ref 0.44–1.00)
GFR, Estimated: >60 mL/min (ref >60)
Glucose, Bld: 143 mg/dL — ABNORMAL HIGH (ref 70–99)
Potassium: 3.4 mmol/L — ABNORMAL LOW (ref 3.5–5.1)
Sodium: 138 mmol/L (ref 135–145)
Total Bilirubin: 0.6 mg/dL (ref 0.0–1.2)
Total Protein: 6.9 g/dL (ref 6.5–8.1)

## 2024-01-28 LAB — CBC
HCT: 32.1 % — ABNORMAL LOW (ref 36.0–46.0)
Hemoglobin: 10.4 g/dL — ABNORMAL LOW (ref 12.0–15.0)
MCH: 29.4 pg (ref 26.0–34.0)
MCHC: 32.4 g/dL (ref 30.0–36.0)
MCV: 90.7 fL (ref 80.0–100.0)
Platelets: 215 10*3/uL (ref 150–400)
RBC: 3.54 MIL/uL — ABNORMAL LOW (ref 3.87–5.11)
RDW: 12.5 % (ref 11.5–15.5)
WBC: 4.8 10*3/uL (ref 4.0–10.5)
nRBC: 0 % (ref 0.0–0.2)

## 2024-01-28 LAB — CBG MONITORING, ED: Glucose-Capillary: 132 mg/dL — ABNORMAL HIGH (ref 70–99)

## 2024-01-28 MED ORDER — DIAZEPAM 5 MG/ML IJ SOLN
2.5000 mg | Freq: Once | INTRAMUSCULAR | Status: AC
  Administered 2024-01-28: 2.5 mg via INTRAVENOUS
  Filled 2024-01-28: qty 2

## 2024-01-28 MED ORDER — SODIUM CHLORIDE 0.9 % IV BOLUS
1000.0000 mL | Freq: Once | INTRAVENOUS | Status: AC
Start: 2024-01-28 — End: 2024-01-29
  Administered 2024-01-28: 1000 mL via INTRAVENOUS

## 2024-01-28 MED ORDER — MECLIZINE HCL 25 MG PO TABS
25.0000 mg | ORAL_TABLET | Freq: Once | ORAL | Status: AC
  Administered 2024-01-28: 25 mg via ORAL
  Filled 2024-01-28: qty 1

## 2024-01-28 MED ORDER — MECLIZINE HCL 25 MG PO TABS: 25.0000 mg | ORAL_TABLET | Freq: Three times a day (TID) | ORAL | 0 refills | Status: DC | PRN

## 2024-01-28 NOTE — ED Triage Notes (Signed)
 Pt to ED via [facility] from home with son from home for dizziness since this morning which started when was getting into bed from standing to lying. States room was spinning and began sweating. Pt denies CP, SOB. Just feels dizzy. Skin is dry. CBG 132.

## 2024-01-28 NOTE — ED Notes (Signed)
 Pt urinated on self. This EDT and Shawn, Paramedic. Linen and brief changed and new chucks placed. Pt provided with sandwich tray and water . Pt refused water . Pt requested sprite instead. Pt given sprite.

## 2024-01-28 NOTE — ED Notes (Signed)
 Pt on bedpan at this time.

## 2024-01-28 NOTE — ED Notes (Signed)
 First Nurse Note: Pt to ED via [facility] from home for dizziness since she woke up. Dizziness went away after she ate something but then it came back. CBG 102. VSS.

## 2024-01-28 NOTE — ED Provider Notes (Signed)
 11:39 PM  Assumed care at shift change.  Patient here with vertigo.  MRI brain negative for stroke.  No significant improvement with meclizine.  Given diazepam and reports feeling better but is now very drowsy.  Will monitor, reassess, attempt ambulatory and p.o. trial.  1:16 AM  Pt reports feeling better.  Tolerating p.o.  She is ambulatory.  Patient offered admission but she declined stating she feels better and her son can help her at home.  Will discharge with meclizine and outpatient follow-up.   Lucendia Leard, Clover Dao, DO 01/29/24 0116

## 2024-01-28 NOTE — ED Provider Notes (Signed)
 [facility] Provider Note    Event Date/Time   First MD Initiated Contact with Patient 01/28/24 1748     (approximate)   History   Dizziness   HPI Diana Green is a 73 y.o. female with history of HTN, HLD presenting today for dizziness.  Patient states shortly after waking up she had an episode of room spinning sensation that appeared most prominent with movement.  She ate and the symptoms went away.  She then felt like later in the day she had recurrence of symptoms with room spinning sensation.  They were occurring both with movement and at rest.  Currently, she does not have the symptoms while at rest.  She otherwise denies nausea, vomiting, lightheadedness, passing out, chest pain, shortness of breath, vision changes.  No numbness or weakness anywhere.  No tinnitus, ear pain, or hearing loss.     Physical Exam   Triage Vital Signs: ED Triage Vitals  Encounter Vitals Group     BP 01/28/24 1442 112/69     Systolic BP Percentile --      Diastolic BP Percentile --      Pulse Rate 01/28/24 1442 (!) 58     Resp 01/28/24 1442 20     Temp 01/28/24 1445 97.6 F (36.4 C)     Temp Source 01/28/24 1442 Oral     SpO2 01/28/24 1442 100 %     Weight 01/28/24 1444 257 lb 15 oz (117 kg)     Height 01/28/24 1444 5\' 9"  (1.753 m)     Head Circumference --      Peak Flow --      Pain Score 01/28/24 1440 6     Pain Loc --      Pain Education --      Exclude from Growth Chart --     Most recent vital signs: Vitals:   01/28/24 1445 01/28/24 2304  BP:  134/67  Pulse:  (!) 56  Resp:  18  Temp: 97.6 F (36.4 C)   SpO2:  96%   Physical Exam: I have reviewed the vital signs and nursing notes. General: Awake, alert, no acute distress.  Nontoxic appearing. Head:  Atraumatic, normocephalic.   ENT:  EOM intact, PERRL. Oral mucosa is pink and moist with no lesions.   Neck: Neck is supple with full range of motion, No meningeal signs. Cardiovascular:  RRR, No  murmurs. Peripheral pulses palpable and equal bilaterally. Respiratory:  Symmetrical chest wall expansion.  No rhonchi, rales, or wheezes.  Good air movement throughout.  No use of accessory muscles.   Musculoskeletal:  No cyanosis or edema. Moving extremities with full ROM Abdomen:  Soft, nontender, nondistended. Neuro:  GCS 15, moving all four extremities, interacting appropriately. Speech clear.  Cranial nerves II through XII intact.  Sensation equal and intact throughout bilateral upper and lower extremities.  Strength intact throughout bilateral upper and lower extremities.  Mild nystagmus present when performing Dix-Hallpike to the right with exacerbation of symptoms. Psych:  Calm, appropriate.   Skin:  Warm, dry, no rash.    ED Results / Procedures / Treatments   Labs (all labs ordered are listed, but only abnormal results are displayed) Labs Reviewed  COMPREHENSIVE METABOLIC PANEL WITH GFR - Abnormal; Notable for the following components:      Result Value   Potassium 3.4 (*)    Glucose, Bld 143 (*)    All other components within normal limits  CBC - Abnormal; Notable  for the following components:   RBC 3.54 (*)    Hemoglobin 10.4 (*)    HCT 32.1 (*)    All other components within normal limits  URINALYSIS, ROUTINE W REFLEX MICROSCOPIC - Abnormal; Notable for the following components:   Color, Urine STRAW (*)    APPearance CLEAR (*)    All other components within normal limits  CBG MONITORING, ED - Abnormal; Notable for the following components:   Glucose-Capillary 132 (*)    All other components within normal limits  CBG MONITORING, ED     EKG My EKG interpretation: Rate of 58, sinus bradycardia with left axis deviation and left bundle branch block.  No acute ST elevations or depressions   RADIOLOGY Independently interpreted MRI with no acute pathology   PROCEDURES:  Critical Care performed: No  Procedures   MEDICATIONS ORDERED IN ED: Medications   meclizine (ANTIVERT) tablet 25 mg (25 mg Oral Given 01/28/24 1904)  sodium chloride  0.9 % bolus 1,000 mL (1,000 mLs Intravenous New Bag/Given 01/28/24 2300)  diazepam (VALIUM) injection 2.5 mg (2.5 mg Intravenous Given 01/28/24 2301)     IMPRESSION / MDM / ASSESSMENT AND PLAN / ED COURSE  I reviewed the triage vital signs and the nursing notes.                              Differential diagnosis includes, but is not limited to, CVA, BPPV, lower concern for vestibular neuritis, schwannoma, or Mnire's  Patient's presentation is most consistent with acute complicated illness / injury requiring diagnostic workup.  Patient is a 73 year old female presenting today for multiple episodes of dizziness.  They appear most prominent when she moves but also occasionally has them at rest.  Symptoms started today.  No other acute neurological deficits.  NIHSS of 0 at this time.  No indication for stroke alert.  Symptoms seem more pronounced with Dix-Hallpike testing but given that she also had them at rest, will get MRI to evaluate for CVA.  Otherwise CBC and CMP largely unremarkable.  Patient also given meclizine while awaiting results of MRI.  MRI shows no evidence of CVA.  Patient was reassessed with ongoing dizziness symptoms and unable to stand on her own.  Was given 1 L fluids and diazepam.  After reassessment, she does feel like the dizziness has improved but now is very lethargic and drowsy from the diazepam.  Will give her little more time to wake back up for reassessment to make sure she is able to ambulate with no ongoing symptoms.  She was signed out to oncoming provider pending reassessment.  If feeling well, can likely be discharged with no other acute pathology.  If still symptomatic, will likely require admission for ongoing management.  The patient is on the cardiac monitor to evaluate for evidence of arrhythmia and/or significant heart rate changes. Clinical Course as of 01/28/24 2347  Mon Jan 28, 2024  2155 MR BRAIN WO CONTRAST Negative [DW]  2244 Patient continuing to have ongoing vertigo symptoms.  She was unable to stand unassisted.  Will give 1 L of fluids and diazepam and reassess but if she continues to have persistent symptoms, will likely require admission for ongoing monitoring, treatment, and physical therapy. [DW]  2345 Patient does feel better at this time with no ongoing dizziness.  However, pretty lethargic from the diazepam so we will give her time to wake up and reassess that she is able to  walk before potential discharge.  Signed out to oncoming provider. [DW]    Clinical Course User Index [DW] Kandee Orion, MD     FINAL CLINICAL IMPRESSION(S) / ED DIAGNOSES   Final diagnoses:  Vertigo     Rx / DC Orders   ED Discharge Orders          Ordered    meclizine (ANTIVERT) 25 MG tablet  3 times daily PRN        01/28/24 2346             Note:  This document was prepared using Dragon voice recognition software and may include unintentional dictation errors.   Kandee Orion, MD 01/28/24 2350

## 2024-01-28 NOTE — Discharge Instructions (Signed)
 MRI showed no evidence of a stroke today.  Your laboratory workup was elsewise reassuring.  I have sent nausea medication to your pharmacy that may continue to help with your symptoms.  Please follow-up with your primary care provider for reassessment.

## 2024-01-29 MED ORDER — ONDANSETRON 4 MG PO TBDP: 4.0000 mg | ORAL_TABLET | Freq: Four times a day (QID) | ORAL | 0 refills | Status: DC | PRN

## 2024-01-29 NOTE — ED Notes (Addendum)
 Pt was able to walk around the room. Pt stated that she felt a little dizzy but better than before. PO challenge completed

## 2024-02-07 DIAGNOSIS — R7303 Prediabetes: Secondary | ICD-10-CM | POA: Diagnosis not present

## 2024-02-07 DIAGNOSIS — M5416 Radiculopathy, lumbar region: Secondary | ICD-10-CM | POA: Diagnosis not present

## 2024-02-10 ENCOUNTER — Other Ambulatory Visit: Payer: Self-pay | Admitting: Internal Medicine

## 2024-02-27 DIAGNOSIS — M5441 Lumbago with sciatica, right side: Secondary | ICD-10-CM | POA: Diagnosis not present

## 2024-02-27 DIAGNOSIS — M5416 Radiculopathy, lumbar region: Secondary | ICD-10-CM | POA: Diagnosis not present

## 2024-02-27 DIAGNOSIS — G8929 Other chronic pain: Secondary | ICD-10-CM | POA: Diagnosis not present

## 2024-02-27 DIAGNOSIS — M5442 Lumbago with sciatica, left side: Secondary | ICD-10-CM | POA: Diagnosis not present

## 2024-02-27 NOTE — Progress Notes (Signed)
 Chief Complaint: Chief Complaint  Diana Green presents with  . Follow-up  Back and bilateral leg pain  HPI: Diana Green is a pleasant 73 y.o. female seen in follow-up for the evaluation of back pain traveling into the bilateral legs, left worse than right.  She is status post bilateral S1 TFESI with Celestone  02/07/2024 with 70% improvement.  She denies any problems or complications postinjection.  She rates her pain as 7/10.  It is intermittent, sharp. Pain is chronic.  She does feel more functional since the epidural injection.  She does complain of numbness, tingling and weakness in the left leg but denies any loss of control of bowel or bladder. Pain is worse with standing, walking, bending, lifting, twisting, laying down, squatting and better with laying down.  Overall she is happy the results of the injections and does feel that pain is much better controlled.  Review of Systems: A 10 point review of systems is negative, except for the pertinent positives and negatives detailed in the HPI.  PMH: Past Medical History:  Diagnosis Date  . Arthritis   . Depression   . Hyperlipidemia   . Hypertension      PSH: Past Surgical History:  Procedure Laterality Date  . CESAREAN SECTION  08/21/1969  . TUBAL LIGATION  08/21/1973  . C3-4 ANTERIOR CERVICAL DECOMPRESSION/DISCECTOMY FUSION WITH HEDRON  07/08/2021   Dr. Reeves Daisy at [facility], [place]  . OPEN T11-12 POSTERIOR DECOMPRESSION AND FUSION  09/19/2021   Dr. Reeves Daisy at [facility], [place]  . ARTHROPLASTY TOTAL KNEE Right 08/29/2022   By Dr. Lorelle  . CHOLECYSTECTOMY    . Left total knee arthroplasty using computer-assisted navigation 06/09/13    . right rotator cuff surgery, 2006       Family History: Family History  Problem Relation Name Age of Onset  . High blood pressure (Hypertension) Mother    . Myocardial Infarction (Heart attack) Mother       Social History: Social History   Socioeconomic History  . Marital status:  Widowed    Spouse name: Lamar  . Number of children: 2  . Years of education: 12  Occupational History  . Occupation: Retired  Tobacco Use  . Smoking status: Never    Passive exposure: Never  . Smokeless tobacco: Never  Vaping Use  . Vaping status: Never Used  Substance and Sexual Activity  . Alcohol use: Yes    Alcohol/week: 0.0 standard drinks of alcohol    Comment: drinks wine occasionally  . Drug use: No  . Sexual activity: Not Currently    Partners: Male    Birth control/protection: Surgical  Social History Narrative   She drinks one cup of tea/coffee per day.  She is married with 2 children.   Social Drivers of Corporate investment banker Strain: Low Risk  (02/28/2021)   Received from [facility]   Overall Financial Resource Strain (CARDIA)   . Difficulty of Paying Living Expenses: Not very hard  Food Insecurity: No Food Insecurity (09/11/2022)   Received from [facility]   Hunger Vital Sign   . Within the past 12 months, you worried that your food would run out before you got the money to buy more.: Never true   . Within the past 12 months, the food you bought just didn't last and you didn't have money to get more.: Never true  Transportation Needs: No Transportation Needs (09/11/2022)   Received from [facility] - Transportation   . Lack  of Transportation (Medical): No   . Lack of Transportation (Non-Medical): No     Allergies: No Known Allergies   Medications:  Current Outpatient Medications:  .  acetaminophen  (TYLENOL ) 500 mg capsule, Take 500-1,000 mg by mouth every 6 (six) hours, Disp: , Rfl:  .  amLODIPine  (NORVASC ) 5 MG tablet, Take 5 mg by mouth once daily, Disp: , Rfl:  .  atenolol  (TENORMIN ) 100 MG tablet, Take 100 mg by mouth once daily., Disp: , Rfl:  .  CALCIUM  ORAL, Take 1 caplet by mouth once daily, Disp: , Rfl:  .  cholecalciferol (VITAMIN D3) 1,250 mcg (50,000 unit) capsule, Take 50,000 Units by mouth once a week, Disp: , Rfl:  .   clotrimazole -betamethasone  (LOTRISONE ) 1-0.05 % cream, Apply topically 2 (two) times daily, Disp: , Rfl:  .  diphenhydrAMINE  (BENADRYL ) 25 mg capsule, Take 25 mg by mouth every 6 (six) hours as needed for Itching, Disp: , Rfl:  .  DULoxetine  (CYMBALTA ) 60 MG DR capsule, Take 60 mg by mouth once daily, Disp: , Rfl:  .  gabapentin  (NEURONTIN ) 600 MG tablet, Take 600 mg by mouth 2 (two) times daily, Disp: , Rfl:  .  hydroCHLOROthiazide  (HYDRODIURIL ) 25 MG tablet, Take 25 mg by mouth once daily, Disp: , Rfl:  .  lidocaine  (SALONPAS) 4 % patch, Place 1 patch onto the skin daily Apply patch to the most painful area for upt to 12 hours in a 24 hours period., Disp: , Rfl:  .  losartan  (COZAAR ) 100 MG tablet, Take 100 mg by mouth once daily., Disp: , Rfl:  .  methocarbamoL  (ROBAXIN ) 500 MG tablet, Take 500 mg by mouth 4 (four) times daily, Disp: , Rfl:  .  miscellaneous medical supply Misc, , Disp: , Rfl:  .  PARoxetine (PAXIL) 20 MG tablet, 1 tablet in the morning Orally Once a day for 30 day(s), Disp: , Rfl:  .  polyethylene glycol (MIRALAX ) packet, Take 17 g by mouth once daily Mix in 4-8ounces of fluid prior to taking., Disp: , Rfl:  .  rosuvastatin  (CRESTOR ) 20 MG tablet, Take 20 mg by mouth once daily, Disp: , Rfl:  .  sennosides-docusate (SENOKOT-S) 8.6-50 mg tablet, Take 1 tablet by mouth 2 (two) times daily, Disp: , Rfl:  .  simvastatin  (ZOCOR ) 20 MG tablet, Take 20 mg by mouth at bedtime, Disp: , Rfl:  .  tiZANidine  (ZANAFLEX ) 4 MG tablet, Take 4 mg by mouth 2 (two) times daily, Disp: , Rfl:  .  traMADoL  (ULTRAM ) 50 mg tablet, Take by mouth, Disp: , Rfl:   Vitals: Vitals:   02/27/24 1003  BP: 121/74  Pulse: 63  Temp: 36.1 C (97 F)  TempSrc: Oral  Weight: (!) 116.1 kg (255 lb 15.3 oz)  Height: 175.3 cm (5' 9.02)  PainSc:   7  PainLoc: Back    Imaging: MRI lumbar spine 03/2022 done at [facility]: T11-12 moderate disc bulge with mild CCS; T12-L1 mild CCS, moderate left and moderate to  severe right NFS; L1 to severe CCS, moderate left and mild right NFS; L2-3 severe CCS, moderate bilateral NFS; L3-4 moderate CCS, moderate left and severe right NFS; L4-5 severe CCS, moderate bilateral NFS; L5-S1 disc bulge minimally contacting the right S1 nerve and left S1 nerve, moderate to severe right and mild left NFS  MRI cervical spine done at [facility] 09/2020 (prior to surgery): 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, C4-7 posterior spinal fixation; C2-3 mild CCS with mild to moderate right NFS;  C3-4 moderate CCS and severe bilateral NFS; C4-5 moderate right NFS; C5-6 mild right and moderate left NFS; C6-7 moderate bilateral NFS  MRI thoracic spine 07/2020 (prior to surgery): C4-5 severe right and moderate left NFS; T9-10 moderate disc bulge with mild right NFS; T10-11 moderate disc bulge; T11-12 severe CCS with increased signal in the cord, severe left NFS  Platelet count 257 drawn on 12/03/2023 GFR 53 drawn on 12/03/2023 Hemoglobin A1c 6.2 drawn on 12/03/2023  Assessment: 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  She has received very excellent and long-term relief with prior injections.  Hopefully she continues to receive very good relief with these injections and avoidance of any further surgery.  We will hold off on any further injections for right now.  I did offer a course of physical therapy and she is interested in this.  Referral placed for [facility] at [facility].    Of note her husband has recently passed away from chronic CHF so  she states that she does have more time to be able to take care of herself.  Per the note from Dr. Clois 11/14/2022, surgery is a consideration (L3-S1 decompression versus L1-S1 decompression).  Follow-up as needed.  She may call back for bilateral S1 TFESI with Celestone  no sooner than 05/09/2024.  Blood sugar should be taken.  Would consider referral back to neurosurgery.  She would need updated MRI lumbar spine and physical therapy.  Diana Green agrees with above plan.  Answered all questions.

## 2024-03-03 ENCOUNTER — Encounter: Payer: Self-pay | Admitting: Internal Medicine

## 2024-03-03 ENCOUNTER — Ambulatory Visit (INDEPENDENT_AMBULATORY_CARE_PROVIDER_SITE_OTHER): Admitting: Internal Medicine

## 2024-03-03 VITALS — BP 124/66 | HR 60 | Ht 69.0 in | Wt 274.0 lb

## 2024-03-03 DIAGNOSIS — R7303 Prediabetes: Secondary | ICD-10-CM | POA: Diagnosis not present

## 2024-03-03 DIAGNOSIS — H8113 Benign paroxysmal vertigo, bilateral: Secondary | ICD-10-CM | POA: Diagnosis not present

## 2024-03-03 DIAGNOSIS — I10 Essential (primary) hypertension: Secondary | ICD-10-CM

## 2024-03-03 DIAGNOSIS — E782 Mixed hyperlipidemia: Secondary | ICD-10-CM

## 2024-03-03 DIAGNOSIS — K219 Gastro-esophageal reflux disease without esophagitis: Secondary | ICD-10-CM

## 2024-03-03 DIAGNOSIS — N898 Other specified noninflammatory disorders of vagina: Secondary | ICD-10-CM

## 2024-03-03 MED ORDER — MECLIZINE HCL 25 MG PO TABS: 25.0000 mg | ORAL_TABLET | Freq: Three times a day (TID) | ORAL | 0 refills | Status: DC | PRN

## 2024-03-03 NOTE — Progress Notes (Signed)
 Established Patient Office Visit  Subjective:  Patient ID: Diana Green, female    DOB: [birth year]  Age: 73 y.o. MRN: [id number]  Chief Complaint  Patient presents with   Follow-up    3 month follow up    Patient comes in for her follow-up today.  Patient was in the emergency room on 01/28/2024 and diagnosed with vertigo.  She had presented with dizziness and her workup including MRI was unremarkable.  Patient was sent home on meclizine , but she has not needed it recently.  Patient admits to being noncompliant with her diet and has gained weight.  She recently lost her husband.  She is taking her Cymbalta  but will be making an appointment with a therapist. Fasting for labs today. Also requested GYN referral as the cyst in her vaginal wall is getting bigger and more uncomfortable.    No other concerns at this time.   Past Medical History:  Diagnosis Date   Arthritis    Depression    Essential hypertension, benign 10/09/2022   GERD (gastroesophageal reflux disease)    occ no meds   High cholesterol    Hypertension    Impaired glucose tolerance 10/09/2022   Iron deficiency anemia 10/09/2022   Vitamin D  deficiency 10/09/2022    Past Surgical History:  Procedure Laterality Date   ANTERIOR CERVICAL DECOMP/DISCECTOMY FUSION N/A 07/08/2021   Procedure: C3-4 ANTERIOR CERVICAL DECOMPRESSION/DISCECTOMY FUSION 1 LEVEL WITH HEDRON;  Surgeon: Clois Fret, MD;  Location: [facility] ORS;  Service: Neurosurgery;  Laterality: N/A;   APPLICATION OF INTRAOPERATIVE CT SCAN N/A 09/19/2021   Procedure: APPLICATION OF INTRAOPERATIVE CT SCAN;  Surgeon: Clois Fret, MD;  Location: [facility] ORS;  Service: Neurosurgery;  Laterality: N/A;   APPLICATION OF WOUND VAC  09/19/2021   Procedure: APPLICATION OF WOUND VAC;  Surgeon: Clois Fret, MD;  Location: [facility] ORS;  Service: Neurosurgery;;  [facility]   BACK SURGERY     thoracic   CESAREAN SECTION  1971   CHOLECYSTECTOMY     COLONOSCOPY      ROTATOR CUFF REPAIR Right 2006   TOTAL KNEE ARTHROPLASTY Left 2014   TOTAL KNEE ARTHROPLASTY Right 09/11/2022   Procedure: TOTAL KNEE ARTHROPLASTY;  Surgeon: Lorelle Hussar, MD;  Location: [facility] ORS;  Service: Orthopedics;  Laterality: Right;   TUBAL LIGATION  1975    Social History   Socioeconomic History   Marital status: Married    Spouse name: Not on file   Number of children: Not on file   Years of education: Not on file   Highest education level: Not on file  Occupational History   Not on file  Tobacco Use   Smoking status: Never   Smokeless tobacco: Never  Vaping Use   Vaping status: Never Used  Substance and Sexual Activity   Alcohol use: Not Currently    Comment: rarely   Drug use: No   Sexual activity: Not on file  Other Topics Concern   Not on file  Social History Narrative   Not on file   Social Drivers of Health   Financial Resource Strain: Low Risk  (02/28/2021)   Received from [facility]   Overall Financial Resource Strain (CARDIA)    Difficulty of Paying Living Expenses: Not very hard  Food Insecurity: No Food Insecurity (09/11/2022)   Hunger Vital Sign    Worried About Running Out of Food in the Last Year: Never true    Ran Out of Food in the Last Year: Never true  Transportation Needs: No Transportation Needs (09/11/2022)   PRAPARE - Administrator, Civil Service (Medical): No    Lack of Transportation (Non-Medical): No  Physical Activity: Not on file  Stress: Not on file  Social Connections: Not on file  Intimate Partner Violence: Not At Risk (09/11/2022)   Humiliation, Afraid, Rape, and Kick questionnaire    Fear of Current or Ex-Partner: No    Emotionally Abused: No    Physically Abused: No    Sexually Abused: No    History reviewed. No pertinent family history.  No Known Allergies  Outpatient Medications Prior to Visit  Medication Sig   acetaminophen  (TYLENOL ) 500 MG tablet Take 1,000 mg by mouth 3 (three) times daily.    amLODipine  (NORVASC ) 5 MG tablet Take 1 tablet (5 mg total) by mouth daily.   atenolol  (TENORMIN ) 100 MG tablet TAKE 1 TABLET BY MOUTH IN THE  MORNING   cholestyramine  (QUESTRAN ) 4 g packet Take 1 packet by mouth 2 (two) times daily.   clotrimazole -betamethasone  (LOTRISONE ) cream APPLY 1 APPLICATION TOPICALLY  TWICE DAILY   DULoxetine  (CYMBALTA ) 60 MG capsule TAKE 1 CAPSULE BY MOUTH IN THE  MORNING   Fe Fum-FA-B Cmp-C-Zn-Mg-Mn-Cu (HEMOCYTE-PLUS) 106-1 MG TABS Take 1 tablet by mouth daily.   furosemide  (LASIX ) 20 MG tablet Take 1 tablet (20 mg total) by mouth daily.   gabapentin  (NEURONTIN ) 600 MG tablet TAKE 1 TABLET BY MOUTH TWICE  DAILY   hydrochlorothiazide  (HYDRODIURIL ) 25 MG tablet TAKE 1 TABLET BY MOUTH IN THE  MORNING   Lidocaine  4 % PTCH Place 1 patch onto the skin daily as needed (back pain).   losartan  (COZAAR ) 100 MG tablet TAKE 1 TABLET BY MOUTH AT  BEDTIME   ondansetron  (ZOFRAN -ODT) 4 MG disintegrating tablet Take 1 tablet (4 mg total) by mouth every 6 (six) hours as needed for nausea or vomiting.   pantoprazole  (PROTONIX ) 40 MG tablet Take 1 tablet (40 mg total) by mouth daily.   rosuvastatin  (CRESTOR ) 20 MG tablet Take 1 tablet (20 mg total) by mouth daily.   [DISCONTINUED] meclizine  (ANTIVERT ) 25 MG tablet Take 1 tablet (25 mg total) by mouth 3 (three) times daily as needed for dizziness.   benzonatate  (TESSALON  PERLES) 100 MG capsule Take 1 capsule (100 mg total) by mouth 3 (three) times daily as needed for cough. (Patient not taking: Reported on 03/03/2024)   No facility-administered medications prior to visit.    Review of Systems  Constitutional: Negative.  Negative for chills, fever, malaise/fatigue and weight loss.  HENT: Negative.  Negative for sore throat.   Eyes: Negative.   Respiratory: Negative.  Negative for cough and shortness of breath.   Cardiovascular: Negative.  Negative for chest pain, palpitations and leg swelling.  Gastrointestinal: Negative.  Negative  for abdominal pain, constipation, diarrhea, heartburn, nausea and vomiting.  Genitourinary: Negative.  Negative for dysuria and flank pain.  Musculoskeletal: Negative.  Negative for joint pain and myalgias.  Skin: Negative.   Neurological: Negative.  Negative for dizziness, tingling, tremors and headaches.  Endo/Heme/Allergies: Negative.   Psychiatric/Behavioral: Negative.  Negative for depression and suicidal ideas. The patient is not nervous/anxious.        Objective:   BP 124/66   Pulse 60   Ht 5' 9 (1.753 m)   Wt 274 lb (124.3 kg)   SpO2 94%   BMI 40.46 kg/m   Vitals:   03/03/24 1057  BP: 124/66  Pulse: 60  Height: 5' 9 (1.753 m)  Weight:  274 lb (124.3 kg)  SpO2: 94%  BMI (Calculated): 40.44    Physical Exam Vitals and nursing note reviewed.  Constitutional:      Appearance: Normal appearance.  HENT:     Head: Normocephalic and atraumatic.     Nose: Nose normal.     Mouth/Throat:     Mouth: Mucous membranes are moist.     Pharynx: Oropharynx is clear.  Eyes:     Conjunctiva/sclera: Conjunctivae normal.     Pupils: Pupils are equal, round, and reactive to light.  Cardiovascular:     Rate and Rhythm: Normal rate and regular rhythm.     Pulses: Normal pulses.     Heart sounds: Normal heart sounds. No murmur heard. Pulmonary:     Effort: Pulmonary effort is normal.     Breath sounds: Normal breath sounds. No wheezing.  Abdominal:     General: Bowel sounds are normal.     Palpations: Abdomen is soft.     Tenderness: There is no abdominal tenderness. There is no right CVA tenderness or left CVA tenderness.  Musculoskeletal:        General: Normal range of motion.     Cervical back: Normal range of motion.     Right lower leg: No edema.     Left lower leg: No edema.  Skin:    General: Skin is warm and dry.  Neurological:     General: No focal deficit present.     Mental Status: She is alert and oriented to person, place, and time.  Psychiatric:         Mood and Affect: Mood normal.        Behavior: Behavior normal.      No results found for any visits on 03/03/24.  Recent Results (from the past 2160 hours)  CBG monitoring, ED     Status: Abnormal   Collection Time: 01/28/24  2:41 PM  Result Value Ref Range   Glucose-Capillary 132 (H) 70 - 99 mg/dL    Comment: Glucose reference range applies only to samples taken after fasting for at least 8 hours.  Comprehensive metabolic panel     Status: Abnormal   Collection Time: 01/28/24  2:45 PM  Result Value Ref Range   Sodium 138 135 - 145 mmol/L   Potassium 3.4 (L) 3.5 - 5.1 mmol/L   Chloride 103 98 - 111 mmol/L   CO2 28 22 - 32 mmol/L   Glucose, Bld 143 (H) 70 - 99 mg/dL    Comment: Glucose reference range applies only to samples taken after fasting for at least 8 hours.   BUN 23 8 - 23 mg/dL   Creatinine, Ser 9.00 0.44 - 1.00 mg/dL   Calcium  9.4 8.9 - 10.3 mg/dL   Total Protein 6.9 6.5 - 8.1 g/dL   Albumin 3.7 3.5 - 5.0 g/dL   AST 26 15 - 41 U/L   ALT 23 0 - 44 U/L   Alkaline Phosphatase 46 38 - 126 U/L   Total Bilirubin 0.6 0.0 - 1.2 mg/dL   GFR, Estimated >39 >39 mL/min    Comment: (NOTE) Calculated using the CKD-EPI Creatinine Equation (2021)    Anion gap 7 5 - 15    Comment: Performed at [facility], [address] [place], [place] [address]  CBC     Status: Abnormal   Collection Time: 01/28/24  2:45 PM  Result Value Ref Range   WBC 4.8 4.0 - 10.5 K/uL   RBC 3.54 (  L) 3.87 - 5.11 MIL/uL   Hemoglobin 10.4 (L) 12.0 - 15.0 g/dL   HCT 67.8 (L) 63.9 - 53.9 %   MCV 90.7 80.0 - 100.0 fL   MCH 29.4 26.0 - 34.0 pg   MCHC 32.4 30.0 - 36.0 g/dL   RDW 87.4 88.4 - 84.4 %   Platelets 215 150 - 400 K/uL   nRBC 0.0 0.0 - 0.2 %    Comment: Performed at [facility], [address] [place], [place] [address]  Urinalysis, Routine w reflex microscopic -Urine, Clean Catch     Status: Abnormal   Collection Time: 01/28/24  9:40 PM  Result Value Ref Range   Color,  Urine STRAW (A) YELLOW   APPearance CLEAR (A) CLEAR   Specific Gravity, Urine 1.006 1.005 - 1.030   pH 7.0 5.0 - 8.0   Glucose, UA NEGATIVE NEGATIVE mg/dL   Hgb urine dipstick NEGATIVE NEGATIVE   Bilirubin Urine NEGATIVE NEGATIVE   Ketones, ur NEGATIVE NEGATIVE mg/dL   Protein, ur NEGATIVE NEGATIVE mg/dL   Nitrite NEGATIVE NEGATIVE   Leukocytes,Ua NEGATIVE NEGATIVE    Comment: Performed at [facility], [address] [place], [place] [address]      Assessment & Plan:  Check labs today.  GYN referral.  Strict diet control emphasized. Problem List Items Addressed This Visit     Mixed hyperlipidemia   Relevant Orders   Lipid Panel w/o Chol/HDL Ratio   Essential hypertension, benign - Primary   Relevant Orders   CMP14+EGFR   Prediabetes   Relevant Orders   Hemoglobin A1c   Gastroesophageal reflux disease without esophagitis   Relevant Medications   meclizine  (ANTIVERT ) 25 MG tablet   Other Relevant Orders   CBC with Diff   Other Visit Diagnoses       Benign positional vertigo, bilateral       Relevant Medications   meclizine  (ANTIVERT ) 25 MG tablet     Vaginal cyst       Relevant Orders   Ambulatory referral to Obstetrics / Gynecology       Return in about 6 weeks (around 04/14/2024).   Total time spent: 30 minutes  FERNAND FREDY RAMAN, MD  03/03/2024   This document may have been prepared by [facility] Voice Recognition software and as such may include unintentional dictation errors.

## 2024-03-04 ENCOUNTER — Ambulatory Visit: Payer: Self-pay | Admitting: Internal Medicine

## 2024-03-04 LAB — CBC WITH DIFFERENTIAL/PLATELET
Basophils Absolute: 0.1 x10E3/uL (ref 0.0–0.2)
Basos: 1 %
EOS (ABSOLUTE): 0.4 x10E3/uL (ref 0.0–0.4)
Eos: 6 %
Hematocrit: 35.3 % (ref 34.0–46.6)
Hemoglobin: 11.2 g/dL (ref 11.1–15.9)
Immature Grans (Abs): 0 x10E3/uL (ref 0.0–0.1)
Immature Granulocytes: 0 %
Lymphocytes Absolute: 1.7 x10E3/uL (ref 0.7–3.1)
Lymphs: 28 %
MCH: 29.6 pg (ref 26.6–33.0)
MCHC: 31.7 g/dL (ref 31.5–35.7)
MCV: 93 fL (ref 79–97)
Monocytes Absolute: 0.6 x10E3/uL (ref 0.1–0.9)
Monocytes: 9 %
Neutrophils Absolute: 3.4 x10E3/uL (ref 1.4–7.0)
Neutrophils: 56 %
Platelets: 236 x10E3/uL (ref 150–450)
RBC: 3.78 x10E6/uL (ref 3.77–5.28)
RDW: 13 % (ref 11.7–15.4)
WBC: 6.1 x10E3/uL (ref 3.4–10.8)

## 2024-03-04 LAB — CMP14+EGFR
ALT: 25 IU/L (ref 0–32)
AST: 26 IU/L (ref 0–40)
Albumin: 4.3 g/dL (ref 3.8–4.8)
Alkaline Phosphatase: 72 IU/L (ref 44–121)
BUN/Creatinine Ratio: 12 (ref 12–28)
BUN: 15 mg/dL (ref 8–27)
Bilirubin Total: 0.3 mg/dL (ref 0.0–1.2)
CO2: 26 mmol/L (ref 20–29)
Calcium: 10.6 mg/dL — ABNORMAL HIGH (ref 8.7–10.3)
Chloride: 100 mmol/L (ref 96–106)
Creatinine, Ser: 1.23 mg/dL — ABNORMAL HIGH (ref 0.57–1.00)
Globulin, Total: 2.8 g/dL (ref 1.5–4.5)
Glucose: 100 mg/dL — ABNORMAL HIGH (ref 70–99)
Potassium: 4.4 mmol/L (ref 3.5–5.2)
Sodium: 141 mmol/L (ref 134–144)
Total Protein: 7.1 g/dL (ref 6.0–8.5)
eGFR: 46 mL/min/1.73 — ABNORMAL LOW (ref >59)

## 2024-03-04 LAB — LIPID PANEL W/O CHOL/HDL RATIO
Cholesterol, Total: 188 mg/dL (ref 100–199)
HDL: 54 mg/dL (ref >39)
LDL Chol Calc (NIH): 80 mg/dL (ref 0–99)
Triglycerides: 339 mg/dL — ABNORMAL HIGH (ref 0–149)
VLDL Cholesterol Cal: 54 mg/dL — ABNORMAL HIGH (ref 5–40)

## 2024-03-04 LAB — HEMOGLOBIN A1C
Est. average glucose Bld gHb Est-mCnc: 146 mg/dL
Hgb A1c MFr Bld: 6.7 % — ABNORMAL HIGH (ref 4.8–5.6)

## 2024-03-04 NOTE — Progress Notes (Signed)
 Patient notified

## 2024-03-05 ENCOUNTER — Other Ambulatory Visit: Payer: Self-pay | Admitting: Internal Medicine

## 2024-03-05 DIAGNOSIS — I10 Essential (primary) hypertension: Secondary | ICD-10-CM

## 2024-03-11 ENCOUNTER — Ambulatory Visit: Admitting: Obstetrics and Gynecology

## 2024-03-11 ENCOUNTER — Encounter: Payer: Self-pay | Admitting: Obstetrics and Gynecology

## 2024-03-11 VITALS — BP 109/68 | HR 62 | Ht 69.0 in | Wt 276.4 lb

## 2024-03-11 DIAGNOSIS — Z7689 Persons encountering health services in other specified circumstances: Secondary | ICD-10-CM | POA: Diagnosis not present

## 2024-03-11 DIAGNOSIS — N907 Vulvar cyst: Secondary | ICD-10-CM

## 2024-03-11 DIAGNOSIS — N898 Other specified noninflammatory disorders of vagina: Secondary | ICD-10-CM

## 2024-03-11 NOTE — Progress Notes (Signed)
 Patient presents today due to a vaginal cyst. She reports noticing a cyst/bump over the past year and now it seems to be getting larger. Denies pain or drainage related to the cyst. No additional concerns.

## 2024-03-11 NOTE — Progress Notes (Signed)
 HPI:      [name] is a 73 y.o. G2P2 who LMP was No LMP recorded. Patient is postmenopausal.  Subjective:   She presents today stating that she has had a cyst in her labia for about a year.  She says it does not cause her any problems but she thinks it might of gotten just a little bit bigger.  Her husband passed away and he was ill before he passed.  She has not had intercourse for several years.  She considers it a possibility and wants to make sure this will not interfere and she is not fond of how it looks. She cannot tell me what side it is on.    Hx: The following portions of the patient's history were reviewed and updated as appropriate:             She  has a past medical history of Arthritis, Depression, Essential hypertension, benign (10/09/2022), GERD (gastroesophageal reflux disease), High cholesterol, Hypertension, Impaired glucose tolerance (10/09/2022), Iron deficiency anemia (10/09/2022), and Vitamin D  deficiency (10/09/2022). She does not have any pertinent problems on file. She  has a past surgical history that includes Cholecystectomy; Cesarean section (1971); Tubal ligation (1975); Rotator cuff repair (Right, 2006); Total knee arthroplasty (Left, 2014); Colonoscopy; Anterior cervical decomp/discectomy fusion (N/A, 07/08/2021); Application of intraoperative CT scan (N/A, 09/19/2021); Application if wound vac (09/19/2021); Back surgery; and Total knee arthroplasty (Right, 09/11/2022). Her family history is not on file. She  reports that she has never smoked. She has never used smokeless tobacco. She reports that she does not currently use alcohol. She reports that she does not use drugs. She has a current medication list which includes the following prescription(s): acetaminophen , amlodipine , atenolol , cholestyramine , clotrimazole -betamethasone , duloxetine , hemocyte-plus, furosemide , gabapentin , hydrochlorothiazide , lidocaine , losartan , meclizine , ondansetron , pantoprazole ,  rosuvastatin , and benzonatate . She has no known allergies.       Review of Systems:  Review of Systems  Constitutional: Denied constitutional symptoms, night sweats, recent illness, fatigue, fever, insomnia and weight loss.  Eyes: Denied eye symptoms, eye pain, photophobia, vision change and visual disturbance.  Ears/Nose/Throat/Neck: Denied ear, nose, throat or neck symptoms, hearing loss, nasal discharge, sinus congestion and sore throat.  Cardiovascular: Denied cardiovascular symptoms, arrhythmia, chest pain/pressure, edema, exercise intolerance, orthopnea and palpitations.  Respiratory: Denied pulmonary symptoms, asthma, pleuritic pain, productive sputum, cough, dyspnea and wheezing.  Gastrointestinal: Denied, gastro-esophageal reflux, melena, nausea and vomiting.  Genitourinary: See HPI for additional information.  Musculoskeletal: Denied musculoskeletal symptoms, stiffness, swelling, muscle weakness and myalgia.  Dermatologic: Denied dermatology symptoms, rash and scar.  Neurologic: Denied neurology symptoms, dizziness, headache, neck pain and syncope.  Psychiatric: Denied psychiatric symptoms, anxiety and depression.  Endocrine: Denied endocrine symptoms including hot flashes and night sweats.   Meds:   Current Outpatient Medications on File Prior to Visit  Medication Sig Dispense Refill   acetaminophen  (TYLENOL ) 500 MG tablet Take 1,000 mg by mouth 3 (three) times daily.     amLODipine  (NORVASC ) 5 MG tablet Take 1 tablet (5 mg total) by mouth daily. 90 tablet 3   atenolol  (TENORMIN ) 100 MG tablet TAKE 1 TABLET BY MOUTH IN THE  MORNING 100 tablet 2   cholestyramine  (QUESTRAN ) 4 g packet Take 1 packet by mouth 2 (two) times daily. 60 each 11   clotrimazole -betamethasone  (LOTRISONE ) cream APPLY 1 APPLICATION TOPICALLY  TWICE DAILY 45 g 3   DULoxetine  (CYMBALTA ) 60 MG capsule TAKE 1 CAPSULE BY MOUTH IN THE  MORNING 90 capsule 3  Fe Fum-FA-B Cmp-C-Zn-Mg-Mn-Cu (HEMOCYTE-PLUS) 106-1 MG  TABS Take 1 tablet by mouth daily. 90 tablet 3   furosemide  (LASIX ) 20 MG tablet Take 1 tablet by mouth once daily 30 tablet 3   gabapentin  (NEURONTIN ) 600 MG tablet TAKE 1 TABLET BY MOUTH TWICE  DAILY 200 tablet 2   hydrochlorothiazide  (HYDRODIURIL ) 25 MG tablet TAKE 1 TABLET BY MOUTH IN THE  MORNING 100 tablet 2   Lidocaine  4 % PTCH Place 1 patch onto the skin daily as needed (back pain).     losartan  (COZAAR ) 100 MG tablet TAKE 1 TABLET BY MOUTH AT  BEDTIME 100 tablet 2   meclizine  (ANTIVERT ) 25 MG tablet Take 1 tablet (25 mg total) by mouth 3 (three) times daily as needed for dizziness. 30 tablet 0   ondansetron  (ZOFRAN -ODT) 4 MG disintegrating tablet Take 1 tablet (4 mg total) by mouth every 6 (six) hours as needed for nausea or vomiting. 20 tablet 0   pantoprazole  (PROTONIX ) 40 MG tablet Take 1 tablet (40 mg total) by mouth daily. 30 tablet 1   rosuvastatin  (CRESTOR ) 20 MG tablet Take 1 tablet (20 mg total) by mouth daily. 90 tablet 3   benzonatate  (TESSALON  PERLES) 100 MG capsule Take 1 capsule (100 mg total) by mouth 3 (three) times daily as needed for cough. (Patient not taking: Reported on 03/03/2024) 30 capsule 1   No current facility-administered medications on file prior to visit.      Objective:     Vitals:   03/11/24 1515  BP: 109/68  Pulse: 62   Filed Weights   03/11/24 1515  Weight: 276 lb 6.4 oz (125.4 kg)              Physical examination   Pelvic:   Vulva: Small fullness on the right side but not an obvious Bartholin cyst.  It is in the location of a Bartholin cyst but it is not tense or discretely palpable.  Several very small sebaceous cysts noted but I do not believe that she is talking about these.  Vagina: No lesions or abnormalities noted.  Support: Normal pelvic support.  Urethra No masses tenderness or scarring.  Meatus Normal size without lesions or prolapse.  Cervix:   Anus: Normal exam.  No lesions.  Perineum: Normal exam.  No lesions.            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 Establishing care with new doctor, encounter for   2. Labial cyst     Possibly a small Bartholin cyst present but it is not tense or discretely palpable.  It will certainly not interfere with intercourse.  She is having no symptoms with it   Plan:            1.  Expectant management.  I do not think she will have any difficulty with this. Orders No orders of the defined types were  placed in this encounter.   No orders of the defined types were placed in this encounter.     F/U  Return for Pt to contact us  if symptoms worsen.  Alm DOROTHA Sar, M.D. 03/11/2024 3:43 PM

## 2024-03-25 DIAGNOSIS — M545 Low back pain, unspecified: Secondary | ICD-10-CM | POA: Diagnosis not present

## 2024-03-25 DIAGNOSIS — M6281 Muscle weakness (generalized): Secondary | ICD-10-CM | POA: Diagnosis not present

## 2024-03-27 DIAGNOSIS — M6281 Muscle weakness (generalized): Secondary | ICD-10-CM | POA: Diagnosis not present

## 2024-03-27 DIAGNOSIS — M545 Low back pain, unspecified: Secondary | ICD-10-CM | POA: Diagnosis not present

## 2024-04-01 DIAGNOSIS — M6281 Muscle weakness (generalized): Secondary | ICD-10-CM | POA: Diagnosis not present

## 2024-04-01 DIAGNOSIS — M545 Low back pain, unspecified: Secondary | ICD-10-CM | POA: Diagnosis not present

## 2024-04-03 DIAGNOSIS — M545 Low back pain, unspecified: Secondary | ICD-10-CM | POA: Diagnosis not present

## 2024-04-03 DIAGNOSIS — M6281 Muscle weakness (generalized): Secondary | ICD-10-CM | POA: Diagnosis not present

## 2024-04-08 DIAGNOSIS — M6281 Muscle weakness (generalized): Secondary | ICD-10-CM | POA: Diagnosis not present

## 2024-04-08 DIAGNOSIS — M545 Low back pain, unspecified: Secondary | ICD-10-CM | POA: Diagnosis not present

## 2024-04-10 DIAGNOSIS — M545 Low back pain, unspecified: Secondary | ICD-10-CM | POA: Diagnosis not present

## 2024-04-10 DIAGNOSIS — M6281 Muscle weakness (generalized): Secondary | ICD-10-CM | POA: Diagnosis not present

## 2024-04-14 ENCOUNTER — Ambulatory Visit (INDEPENDENT_AMBULATORY_CARE_PROVIDER_SITE_OTHER): Admitting: Internal Medicine

## 2024-04-14 ENCOUNTER — Encounter: Payer: Self-pay | Admitting: Internal Medicine

## 2024-04-14 VITALS — BP 122/74 | HR 60 | Ht 69.0 in | Wt 277.0 lb

## 2024-04-14 DIAGNOSIS — E0800 Diabetes mellitus due to underlying condition with hyperosmolarity without nonketotic hyperglycemic-hyperosmolar coma (NKHHC): Secondary | ICD-10-CM | POA: Insufficient documentation

## 2024-04-14 DIAGNOSIS — I10 Essential (primary) hypertension: Secondary | ICD-10-CM | POA: Diagnosis not present

## 2024-04-14 DIAGNOSIS — M25471 Effusion, right ankle: Secondary | ICD-10-CM

## 2024-04-14 DIAGNOSIS — R9431 Abnormal electrocardiogram [ECG] [EKG]: Secondary | ICD-10-CM | POA: Insufficient documentation

## 2024-04-14 DIAGNOSIS — E782 Mixed hyperlipidemia: Secondary | ICD-10-CM | POA: Diagnosis not present

## 2024-04-14 DIAGNOSIS — E1169 Type 2 diabetes mellitus with other specified complication: Secondary | ICD-10-CM | POA: Insufficient documentation

## 2024-04-14 DIAGNOSIS — M25472 Effusion, left ankle: Secondary | ICD-10-CM | POA: Diagnosis not present

## 2024-04-14 MED ORDER — EMPAGLIFLOZIN 10 MG PO TABS
10.0000 mg | ORAL_TABLET | Freq: Every day | ORAL | 3 refills | Status: DC
Start: 2024-04-14 — End: 2024-05-19

## 2024-04-14 NOTE — Progress Notes (Signed)
 Established Patient Office Visit  Subjective:  Patient ID: Diana Green, female    DOB: 1951/06/10  Age: 73 y.o. MRN: [id number]  Chief Complaint  Patient presents with   Follow-up    6 week follow up    Patient was seen today for a 6 week follow up. She previously was seen for a cyst on her labia and referred to [facility], he reported it was nothing that needed to be removed and it was not bothering the patient. Patient was seen 6 weeks ago in the emergency department for vertigo, they prescribed meclizine  but patient reports she has had no more issues and has not needed to take the meclizine . Patient denies headaches, chest pain, shortness of breath, nausea, vomiting, or diarrhea.  Patient reports she is trying to watch her diet and reduce her sugar and carbohydrate intake but continues to gain weight. Her HbgA1c increased from 6.2 to 6.7%; she is not diabetic. We will start Jardiance  to help with blood sugar control.   Patient has a history of bilateral lower leg edema; she states it is getting worse. She reports she is taking her hydrochlorothiazide , lasix , and wearing her compression stockings daily. Will check EKG today and refer her to Cardiology. She had an echo and last followed Cardiology two years ago.  Patient had been taking Cymbalta  for anxiety/depression and reports that it is helping reduce her symptoms in conjunction with speaking to a therapist twice a week.  Patient reported recently having wheezing and excessive phlegm in the morning after sitting up from sleeping. Patient is not currently taking anything for allergies or postnasal drip. She denies fever and chills.     No other concerns at this time.   Past Medical History:  Diagnosis Date   Arthritis    Depression    Essential hypertension, benign 10/09/2022   GERD (gastroesophageal reflux disease)    occ no meds   High cholesterol    Hypertension    Impaired glucose tolerance 10/09/2022   Iron deficiency anemia  10/09/2022   Vitamin D  deficiency 10/09/2022    Past Surgical History:  Procedure Laterality Date   ANTERIOR CERVICAL DECOMP/DISCECTOMY FUSION N/A 07/08/2021   Procedure: C3-4 ANTERIOR CERVICAL DECOMPRESSION/DISCECTOMY FUSION 1 LEVEL WITH HEDRON;  Surgeon: Clois Fret, MD;  Location: [facility] ORS;  Service: Neurosurgery;  Laterality: N/A;   APPLICATION OF INTRAOPERATIVE CT SCAN N/A 09/19/2021   Procedure: APPLICATION OF INTRAOPERATIVE CT SCAN;  Surgeon: Clois Fret, MD;  Location: [facility] ORS;  Service: Neurosurgery;  Laterality: N/A;   APPLICATION OF WOUND VAC  09/19/2021   Procedure: APPLICATION OF WOUND VAC;  Surgeon: Clois Fret, MD;  Location: [facility] ORS;  Service: Neurosurgery;;  [facility]   BACK SURGERY     thoracic   CESAREAN SECTION  1971   CHOLECYSTECTOMY     COLONOSCOPY     ROTATOR CUFF REPAIR Right 2006   TOTAL KNEE ARTHROPLASTY Left 2014   TOTAL KNEE ARTHROPLASTY Right 09/11/2022   Procedure: TOTAL KNEE ARTHROPLASTY;  Surgeon: Lorelle Hussar, MD;  Location: [facility] ORS;  Service: Orthopedics;  Laterality: Right;   TUBAL LIGATION  1975    Social History   Socioeconomic History   Marital status: Married    Spouse name: Not on file   Number of children: Not on file   Years of education: Not on file   Highest education level: Not on file  Occupational History   Not on file  Tobacco Use   Smoking status: Never   Smokeless  tobacco: Never  Vaping Use   Vaping status: Never Used  Substance and Sexual Activity   Alcohol use: Not Currently    Comment: rarely   Drug use: No   Sexual activity: Not Currently    Birth control/protection: Post-menopausal  Other Topics Concern   Not on file  Social History Narrative   Not on file   Social Drivers of Health   Financial Resource Strain: Low Risk  (02/28/2021)   Received from [facility]   Overall Financial Resource Strain (CARDIA)    Difficulty of Paying Living Expenses: Not very hard  Food Insecurity: No  Food Insecurity (09/11/2022)   Hunger Vital Sign    Worried About Running Out of Food in the Last Year: Never true    Ran Out of Food in the Last Year: Never true  Transportation Needs: No Transportation Needs (09/11/2022)   PRAPARE - Administrator, Civil Service (Medical): No    Lack of Transportation (Non-Medical): No  Physical Activity: Not on file  Stress: Not on file  Social Connections: Not on file  Intimate Partner Violence: Not At Risk (09/11/2022)   Humiliation, Afraid, Rape, and Kick questionnaire    Fear of Current or Ex-Partner: No    Emotionally Abused: No    Physically Abused: No    Sexually Abused: No    History reviewed. No pertinent family history.  No Known Allergies  Outpatient Medications Prior to Visit  Medication Sig   acetaminophen  (TYLENOL ) 500 MG tablet Take 1,000 mg by mouth 3 (three) times daily.   amLODipine  (NORVASC ) 5 MG tablet Take 1 tablet (5 mg total) by mouth daily.   atenolol  (TENORMIN ) 100 MG tablet TAKE 1 TABLET BY MOUTH IN THE  MORNING   DULoxetine  (CYMBALTA ) 60 MG capsule TAKE 1 CAPSULE BY MOUTH IN THE  MORNING   furosemide  (LASIX ) 20 MG tablet Take 1 tablet by mouth once daily   gabapentin  (NEURONTIN ) 600 MG tablet TAKE 1 TABLET BY MOUTH TWICE  DAILY   hydrochlorothiazide  (HYDRODIURIL ) 25 MG tablet TAKE 1 TABLET BY MOUTH IN THE  MORNING   Lidocaine  4 % PTCH Place 1 patch onto the skin daily as needed (back pain).   losartan  (COZAAR ) 100 MG tablet TAKE 1 TABLET BY MOUTH AT  BEDTIME   meclizine  (ANTIVERT ) 25 MG tablet Take 1 tablet (25 mg total) by mouth 3 (three) times daily as needed for dizziness.   rosuvastatin  (CRESTOR ) 20 MG tablet Take 1 tablet (20 mg total) by mouth daily.   benzonatate  (TESSALON  PERLES) 100 MG capsule Take 1 capsule (100 mg total) by mouth 3 (three) times daily as needed for cough. (Patient not taking: Reported on 04/14/2024)   cholestyramine  (QUESTRAN ) 4 g packet Take 1 packet by mouth 2 (two) times daily.  (Patient not taking: Reported on 04/14/2024)   clotrimazole -betamethasone  (LOTRISONE ) cream APPLY 1 APPLICATION TOPICALLY  TWICE DAILY (Patient not taking: Reported on 04/14/2024)   Fe Fum-FA-B Cmp-C-Zn-Mg-Mn-Cu (HEMOCYTE-PLUS) 106-1 MG TABS Take 1 tablet by mouth daily. (Patient not taking: Reported on 04/14/2024)   ondansetron  (ZOFRAN -ODT) 4 MG disintegrating tablet Take 1 tablet (4 mg total) by mouth every 6 (six) hours as needed for nausea or vomiting. (Patient not taking: Reported on 04/14/2024)   pantoprazole  (PROTONIX ) 40 MG tablet Take 1 tablet (40 mg total) by mouth daily. (Patient not taking: Reported on 04/14/2024)   No facility-administered medications prior to visit.    Review of Systems  Constitutional: Negative.  Negative for chills,  diaphoresis and fever.  HENT: Negative.    Eyes: Negative.   Respiratory: Negative.  Negative for cough and shortness of breath.   Cardiovascular:  Positive for leg swelling (bilateral lower leg edema). Negative for chest pain and palpitations.  Gastrointestinal: Negative.  Negative for abdominal pain, constipation, diarrhea, heartburn, nausea and vomiting.  Genitourinary: Negative.  Negative for dysuria and flank pain.  Musculoskeletal: Negative.  Negative for joint pain and myalgias.  Skin: Negative.   Neurological: Negative.  Negative for dizziness, tingling, tremors and headaches.  Endo/Heme/Allergies: Negative.   Psychiatric/Behavioral: Negative.  Negative for depression and suicidal ideas. The patient is not nervous/anxious.        Objective:   BP 122/74   Pulse 60   Ht 5' 9 (1.753 m)   Wt 277 lb (125.6 kg)   SpO2 97%   BMI 40.91 kg/m   Vitals:   04/14/24 1110  BP: 122/74  Pulse: 60  Height: 5' 9 (1.753 m)  Weight: 277 lb (125.6 kg)  SpO2: 97%  BMI (Calculated): 40.89    Physical Exam Vitals and nursing note reviewed.  Constitutional:      Appearance: Normal appearance.  HENT:     Head: Normocephalic and atraumatic.      Nose: Nose normal.     Mouth/Throat:     Mouth: Mucous membranes are moist.     Pharynx: Oropharynx is clear.  Eyes:     Conjunctiva/sclera: Conjunctivae normal.     Pupils: Pupils are equal, round, and reactive to light.  Cardiovascular:     Rate and Rhythm: Normal rate. Rhythm irregular.     Pulses: Normal pulses.     Heart sounds: Normal heart sounds. No murmur heard. Pulmonary:     Effort: Pulmonary effort is normal.     Breath sounds: Normal breath sounds. No wheezing.  Abdominal:     General: Bowel sounds are normal.     Palpations: Abdomen is soft.     Tenderness: There is no abdominal tenderness. There is no right CVA tenderness or left CVA tenderness.  Musculoskeletal:        General: Swelling present. Normal range of motion.     Cervical back: Normal range of motion.     Right lower leg: 2+ Edema present.     Left lower leg: 2+ Edema present.  Skin:    General: Skin is warm and dry.  Neurological:     General: No focal deficit present.     Mental Status: She is alert and oriented to person, place, and time.  Psychiatric:        Mood and Affect: Mood normal.        Behavior: Behavior normal.      No results found for any visits on 04/14/24.  Recent Results (from the past 2160 hours)  CBG monitoring, ED     Status: Abnormal   Collection Time: 01/28/24  2:41 PM  Result Value Ref Range   Glucose-Capillary 132 (H) 70 - 99 mg/dL    Comment: Glucose reference range applies only to samples taken after fasting for at least 8 hours.  Comprehensive metabolic panel     Status: Abnormal   Collection Time: 01/28/24  2:45 PM  Result Value Ref Range   Sodium 138 135 - 145 mmol/L   Potassium 3.4 (L) 3.5 - 5.1 mmol/L   Chloride 103 98 - 111 mmol/L   CO2 28 22 - 32 mmol/L   Glucose, Bld 143 (H) 70 - 99 mg/dL  Comment: Glucose reference range applies only to samples taken after fasting for at least 8 hours.   BUN 23 8 - 23 mg/dL   Creatinine, Ser 9.00 0.44 - 1.00 mg/dL    Calcium  9.4 8.9 - 10.3 mg/dL   Total Protein 6.9 6.5 - 8.1 g/dL   Albumin 3.7 3.5 - 5.0 g/dL   AST 26 15 - 41 U/L   ALT 23 0 - 44 U/L   Alkaline Phosphatase 46 38 - 126 U/L   Total Bilirubin 0.6 0.0 - 1.2 mg/dL   GFR, Estimated >39 >39 mL/min    Comment: (NOTE) Calculated using the CKD-EPI Creatinine Equation (2021)    Anion gap 7 5 - 15    Comment: Performed at [facility], [address] [place], [place] [address]  CBC     Status: Abnormal   Collection Time: 01/28/24  2:45 PM  Result Value Ref Range   WBC 4.8 4.0 - 10.5 K/uL   RBC 3.54 (L) 3.87 - 5.11 MIL/uL   Hemoglobin 10.4 (L) 12.0 - 15.0 g/dL   HCT 67.8 (L) 63.9 - 53.9 %   MCV 90.7 80.0 - 100.0 fL   MCH 29.4 26.0 - 34.0 pg   MCHC 32.4 30.0 - 36.0 g/dL   RDW 87.4 88.4 - 84.4 %   Platelets 215 150 - 400 K/uL   nRBC 0.0 0.0 - 0.2 %    Comment: Performed at [facility], [address] [place], [place] [address]  Urinalysis, Routine w reflex microscopic -Urine, Clean Catch     Status: Abnormal   Collection Time: 01/28/24  9:40 PM  Result Value Ref Range   Color, Urine STRAW (A) YELLOW   APPearance CLEAR (A) CLEAR   Specific Gravity, Urine 1.006 1.005 - 1.030   pH 7.0 5.0 - 8.0   Glucose, UA NEGATIVE NEGATIVE mg/dL   Hgb urine dipstick NEGATIVE NEGATIVE   Bilirubin Urine NEGATIVE NEGATIVE   Ketones, ur NEGATIVE NEGATIVE mg/dL   Protein, ur NEGATIVE NEGATIVE mg/dL   Nitrite NEGATIVE NEGATIVE   Leukocytes,Ua NEGATIVE NEGATIVE    Comment: Performed at [facility], [address] [place], [place] [address]  CMP14+EGFR     Status: Abnormal   Collection Time: 03/03/24 11:50 AM  Result Value Ref Range   Glucose 100 (H) 70 - 99 mg/dL   BUN 15 8 - 27 mg/dL   Creatinine, Ser 8.76 (H) 0.57 - 1.00 mg/dL   eGFR 46 (L) >40 fO/fpw/8.26   BUN/Creatinine Ratio 12 12 - 28   Sodium 141 134 - 144 mmol/L   Potassium 4.4 3.5 - 5.2 mmol/L   Chloride 100 96 - 106 mmol/L   CO2 26 20 - 29 mmol/L    Calcium  10.6 (H) 8.7 - 10.3 mg/dL   Total Protein 7.1 6.0 - 8.5 g/dL   Albumin 4.3 3.8 - 4.8 g/dL   Globulin, Total 2.8 1.5 - 4.5 g/dL   Bilirubin Total 0.3 0.0 - 1.2 mg/dL   Alkaline Phosphatase 72 44 - 121 IU/L   AST 26 0 - 40 IU/L   ALT 25 0 - 32 IU/L  Lipid Panel w/o Chol/HDL Ratio     Status: Abnormal   Collection Time: 03/03/24 11:50 AM  Result Value Ref Range   Cholesterol, Total 188 100 - 199 mg/dL   Triglycerides 660 (H) 0 - 149 mg/dL   HDL 54 >60 mg/dL   VLDL Cholesterol Cal 54 (H) 5 - 40 mg/dL   LDL  Chol Calc (NIH) 80 0 - 99 mg/dL  CBC with Diff     Status: None   Collection Time: 03/03/24 11:50 AM  Result Value Ref Range   WBC 6.1 3.4 - 10.8 x10E3/uL   RBC 3.78 3.77 - 5.28 x10E6/uL   Hemoglobin 11.2 11.1 - 15.9 g/dL   Hematocrit 64.6 65.9 - 46.6 %   MCV 93 79 - 97 fL   MCH 29.6 26.6 - 33.0 pg   MCHC 31.7 31.5 - 35.7 g/dL   RDW 86.9 88.2 - 84.5 %   Platelets 236 150 - 450 x10E3/uL   Neutrophils 56 Not Estab. %   Lymphs 28 Not Estab. %   Monocytes 9 Not Estab. %   Eos 6 Not Estab. %   Basos 1 Not Estab. %   Neutrophils Absolute 3.4 1.4 - 7.0 x10E3/uL   Lymphocytes Absolute 1.7 0.7 - 3.1 x10E3/uL   Monocytes Absolute 0.6 0.1 - 0.9 x10E3/uL   EOS (ABSOLUTE) 0.4 0.0 - 0.4 x10E3/uL   Basophils Absolute 0.1 0.0 - 0.2 x10E3/uL   Immature Granulocytes 0 Not Estab. %   Immature Grans (Abs) 0.0 0.0 - 0.1 x10E3/uL  Hemoglobin A1c     Status: Abnormal   Collection Time: 03/03/24 11:50 AM  Result Value Ref Range   Hgb A1c MFr Bld 6.7 (H) 4.8 - 5.6 %    Comment:          Prediabetes: 5.7 - 6.4          Diabetes: >6.4          Glycemic control for adults with diabetes: <7.0    Est. average glucose Bld gHb Est-mCnc 146 mg/dL      Assessment & Plan:  Continue taking medications as prescribed. Start Jardiance  10 mg once daily. Will order a EKG today for irregular heart rate. Schedule Echo and referral to Cardiology as she hasn't been seen by one in two years. Will  collect basic labs. 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  Total time spent: 30 minutes  FERNAND FREDY RAMAN, MD  04/14/2024   This document may have been prepared by [facility] Voice Recognition software and as such may include unintentional dictation errors.

## 2024-04-15 ENCOUNTER — Telehealth: Payer: Self-pay

## 2024-04-15 ENCOUNTER — Ambulatory Visit: Payer: Self-pay | Admitting: Internal Medicine

## 2024-04-15 ENCOUNTER — Other Ambulatory Visit: Payer: Self-pay | Admitting: Internal Medicine

## 2024-04-15 DIAGNOSIS — M6281 Muscle weakness (generalized): Secondary | ICD-10-CM | POA: Diagnosis not present

## 2024-04-15 DIAGNOSIS — M545 Low back pain, unspecified: Secondary | ICD-10-CM | POA: Diagnosis not present

## 2024-04-15 DIAGNOSIS — E0800 Diabetes mellitus due to underlying condition with hyperosmolarity without nonketotic hyperglycemic-hyperosmolar coma (NKHHC): Secondary | ICD-10-CM

## 2024-04-15 LAB — BASIC METABOLIC PANEL WITH GFR
BUN/Creatinine Ratio: 10 — ABNORMAL LOW (ref 12–28)
BUN: 12 mg/dL (ref 8–27)
CO2: 25 mmol/L (ref 20–29)
Calcium: 10.2 mg/dL (ref 8.7–10.3)
Chloride: 101 mmol/L (ref 96–106)
Creatinine, Ser: 1.18 mg/dL — ABNORMAL HIGH (ref 0.57–1.00)
Glucose: 99 mg/dL (ref 70–99)
Potassium: 4.4 mmol/L (ref 3.5–5.2)
Sodium: 141 mmol/L (ref 134–144)
eGFR: 49 mL/min/1.73 — ABNORMAL LOW (ref >59)

## 2024-04-15 MED ORDER — RYBELSUS 3 MG PO TABS
3.0000 mg | ORAL_TABLET | Freq: Every day | ORAL | 0 refills | Status: DC
Start: 2024-04-15 — End: 2024-07-08

## 2024-04-15 NOTE — Telephone Encounter (Signed)
 Pt was seen yesterday and was prescribed Jardiance  but it is $200. Pt is requesting an alternative.

## 2024-04-17 DIAGNOSIS — M6281 Muscle weakness (generalized): Secondary | ICD-10-CM | POA: Diagnosis not present

## 2024-04-17 DIAGNOSIS — M545 Low back pain, unspecified: Secondary | ICD-10-CM | POA: Diagnosis not present

## 2024-04-22 ENCOUNTER — Ambulatory Visit (INDEPENDENT_AMBULATORY_CARE_PROVIDER_SITE_OTHER): Admitting: Podiatry

## 2024-04-22 DIAGNOSIS — M65972 Unspecified synovitis and tenosynovitis, left ankle and foot: Secondary | ICD-10-CM

## 2024-04-22 NOTE — Progress Notes (Signed)
 Subjective:  Patient ID: Diana Green, female    DOB: 1951/02/06,  MRN: [id number]  Chief Complaint  Patient presents with   Foot Pain    Left ankle pain  Pt stated that her ankle is bothering her again she has saw Dr Silva  in the past for this     73 y.o. female presents with the above complaint.  Patient presents with follow-up of left ankle pain.  She states she got an injection by Dr. Kristina helped her for a little bit she would like to do another 1 denies any other acute complaints Review of Systems: Negative except as noted in the HPI. Denies N/V/F/Ch.  Past Medical History:  Diagnosis Date   Arthritis    Depression    Essential hypertension, benign 10/09/2022   GERD (gastroesophageal reflux disease)    occ no meds   High cholesterol    Hypertension    Impaired glucose tolerance 10/09/2022   Iron deficiency anemia 10/09/2022   Vitamin D  deficiency 10/09/2022    Current Outpatient Medications:    acetaminophen  (TYLENOL ) 500 MG tablet, Take 1,000 mg by mouth 3 (three) times daily., Disp: , Rfl:    amLODipine  (NORVASC ) 5 MG tablet, Take 1 tablet (5 mg total) by mouth daily., Disp: 90 tablet, Rfl: 3   atenolol  (TENORMIN ) 100 MG tablet, TAKE 1 TABLET BY MOUTH IN THE  MORNING, Disp: 100 tablet, Rfl: 2   benzonatate  (TESSALON  PERLES) 100 MG capsule, Take 1 capsule (100 mg total) by mouth 3 (three) times daily as needed for cough. (Patient not taking: Reported on 04/14/2024), Disp: 30 capsule, Rfl: 1   cholestyramine  (QUESTRAN ) 4 g packet, Take 1 packet by mouth 2 (two) times daily. (Patient not taking: Reported on 04/14/2024), Disp: 60 each, Rfl: 11   clotrimazole -betamethasone  (LOTRISONE ) cream, APPLY 1 APPLICATION TOPICALLY  TWICE DAILY (Patient not taking: Reported on 04/14/2024), Disp: 45 g, Rfl: 3   DULoxetine  (CYMBALTA ) 60 MG capsule, TAKE 1 CAPSULE BY MOUTH IN THE  MORNING, Disp: 90 capsule, Rfl: 3   empagliflozin  (JARDIANCE ) 10 MG TABS tablet, Take 1 tablet (10 mg total)  by mouth daily before breakfast., Disp: 30 tablet, Rfl: 3   Fe Fum-FA-B Cmp-C-Zn-Mg-Mn-Cu (HEMOCYTE-PLUS) 106-1 MG TABS, Take 1 tablet by mouth daily. (Patient not taking: Reported on 04/14/2024), Disp: 90 tablet, Rfl: 3   furosemide  (LASIX ) 20 MG tablet, Take 1 tablet by mouth once daily, Disp: 30 tablet, Rfl: 3   gabapentin  (NEURONTIN ) 600 MG tablet, TAKE 1 TABLET BY MOUTH TWICE  DAILY, Disp: 200 tablet, Rfl: 2   hydrochlorothiazide  (HYDRODIURIL ) 25 MG tablet, TAKE 1 TABLET BY MOUTH IN THE  MORNING, Disp: 100 tablet, Rfl: 2   Lidocaine  4 % PTCH, Place 1 patch onto the skin daily as needed (back pain)., Disp: , Rfl:    losartan  (COZAAR ) 100 MG tablet, TAKE 1 TABLET BY MOUTH AT  BEDTIME, Disp: 100 tablet, Rfl: 2   meclizine  (ANTIVERT ) 25 MG tablet, Take 1 tablet (25 mg total) by mouth 3 (three) times daily as needed for dizziness., Disp: 30 tablet, Rfl: 0   ondansetron  (ZOFRAN -ODT) 4 MG disintegrating tablet, Take 1 tablet (4 mg total) by mouth every 6 (six) hours as needed for nausea or vomiting. (Patient not taking: Reported on 04/14/2024), Disp: 20 tablet, Rfl: 0   pantoprazole  (PROTONIX ) 40 MG tablet, Take 1 tablet (40 mg total) by mouth daily. (Patient not taking: Reported on 04/14/2024), Disp: 30 tablet, Rfl: 1   rosuvastatin  (CRESTOR ) 20 MG  tablet, Take 1 tablet (20 mg total) by mouth daily., Disp: 90 tablet, Rfl: 3   Semaglutide  (RYBELSUS ) 3 MG TABS, Take 1 tablet (3 mg total) by mouth daily., Disp: 30 tablet, Rfl: 0  Social History   Tobacco Use  Smoking Status Never  Smokeless Tobacco Never    No Known Allergies Objective:  There were no vitals filed for this visit. There is no height or weight on file to calculate BMI. Constitutional Well developed. Well nourished.  Vascular Dorsalis pedis pulses palpable bilaterally. Posterior tibial pulses palpable bilaterally. Capillary refill normal to all digits.  No cyanosis or clubbing noted. Pedal hair growth normal.  Neurologic Normal  speech. Oriented to person, place, and time. Epicritic sensation to light touch grossly present bilaterally.  Dermatologic Nails well groomed and normal in appearance. No open wounds. No skin lesions.  Orthopedic: Pain on palpation to the left ankle joint pain at the medial gutter.  Pain with range of motion of the joint deep intra-articular pain noted.  No other bony abnormalities noted.  No pain at the Achilles tendon peroneal tendon ATFL ligament   Radiographs: None Assessment:   1. Synovitis of left ankle [M65.972]     Plan:  Patient was evaluated and treated and all questions answered.  Left ankle capsulitis -All questions and concerns were discussed with the patient in extensive detail given the amount of pain that she is having she will benefit from a steroid injection to help decrease acute inflammatory component associate with pain.  Patient agrees with plan we will proceed with steroid injection -A another steroid injection was performed at left ankle using 1% plain Lidocaine  and 10 mg of Kenalog. This was well tolerated.   No follow-ups on file.

## 2024-04-24 DIAGNOSIS — M6281 Muscle weakness (generalized): Secondary | ICD-10-CM | POA: Diagnosis not present

## 2024-04-24 DIAGNOSIS — M545 Low back pain, unspecified: Secondary | ICD-10-CM | POA: Diagnosis not present

## 2024-05-06 DIAGNOSIS — M545 Low back pain, unspecified: Secondary | ICD-10-CM | POA: Diagnosis not present

## 2024-05-06 DIAGNOSIS — M6281 Muscle weakness (generalized): Secondary | ICD-10-CM | POA: Diagnosis not present

## 2024-05-09 ENCOUNTER — Ambulatory Visit: Admitting: Internal Medicine

## 2024-05-09 ENCOUNTER — Other Ambulatory Visit

## 2024-05-09 ENCOUNTER — Encounter: Payer: Self-pay | Admitting: Internal Medicine

## 2024-05-09 VITALS — BP 128/84 | HR 74 | Ht 69.0 in | Wt 274.0 lb

## 2024-05-09 DIAGNOSIS — E1159 Type 2 diabetes mellitus with other circulatory complications: Secondary | ICD-10-CM | POA: Insufficient documentation

## 2024-05-09 DIAGNOSIS — Z6841 Body Mass Index (BMI) 40.0 and over, adult: Secondary | ICD-10-CM

## 2024-05-09 DIAGNOSIS — E782 Mixed hyperlipidemia: Secondary | ICD-10-CM

## 2024-05-09 DIAGNOSIS — T8484XA Pain due to internal orthopedic prosthetic devices, implants and grafts, initial encounter: Secondary | ICD-10-CM | POA: Diagnosis not present

## 2024-05-09 DIAGNOSIS — E0800 Diabetes mellitus due to underlying condition with hyperosmolarity without nonketotic hyperglycemic-hyperosmolar coma (NKHHC): Secondary | ICD-10-CM

## 2024-05-09 DIAGNOSIS — Z96652 Presence of left artificial knee joint: Secondary | ICD-10-CM | POA: Diagnosis not present

## 2024-05-09 DIAGNOSIS — E1169 Type 2 diabetes mellitus with other specified complication: Secondary | ICD-10-CM | POA: Diagnosis not present

## 2024-05-09 DIAGNOSIS — M25462 Effusion, left knee: Secondary | ICD-10-CM | POA: Diagnosis not present

## 2024-05-09 DIAGNOSIS — I152 Hypertension secondary to endocrine disorders: Secondary | ICD-10-CM

## 2024-05-09 MED ORDER — GLIMEPIRIDE 2 MG PO TABS: 2.0000 mg | ORAL_TABLET | Freq: Every day | ORAL | 3 refills | Status: AC

## 2024-05-09 NOTE — Progress Notes (Signed)
 Established Patient Office Visit  Subjective:  Patient ID: Diana Green, female    DOB: 1951/02/26  Age: 73 y.o. MRN: [id number]  Chief Complaint  Patient presents with   Follow-up    6 week follow up    Patient here to follow up. She reports she had stopped the Jardiance  due to cost. She also reports she has stopped Rybelus 3 mg due to headaches. She states her blood sugars at home run 120-130s. Last HbgA1c had increased to 67.% in 02/2024. Patient was not trialed on Metformin in the past due to baseline elevated creatine.Will start glimepiride  2 mg once daily. Patient requesting referral to Nutritionist; referral sent. Encouraged patient to enhance diet control of carbohydrates, concentrated sweets, fried fatty processed foods. Patient is not due for routine fasting labs yet. Patient will have labs completed when she returns in October.  Patient reports recent vision changes that lasted approximately 1-2 weeks. States she had normal eye exam earlier this year. Patient is concerned about slow speech recognition and states her family has also made comments. She had brain MRI during ED visit 01/2024 that showed age related ischemia but no acute abnormalities.  Previously an ECHO and Cardiology referral were sent. She has appointments scheduled next week to have those completed.  Patient denies any headache, shortness of breath, chest pain, abdominal pain, nausea, vomiting, diarrhea/constipation at this time.      No other concerns at this time.   Past Medical History:  Diagnosis Date   Arthritis    Depression    Essential hypertension, benign 10/09/2022   GERD (gastroesophageal reflux disease)    occ no meds   High cholesterol    Hypertension    Impaired glucose tolerance 10/09/2022   Iron deficiency anemia 10/09/2022   Vitamin D  deficiency 10/09/2022    Past Surgical History:  Procedure Laterality Date   ANTERIOR CERVICAL DECOMP/DISCECTOMY FUSION N/A 07/08/2021   Procedure:  C3-4 ANTERIOR CERVICAL DECOMPRESSION/DISCECTOMY FUSION 1 LEVEL WITH HEDRON;  Surgeon: Clois Fret, MD;  Location: [facility];  Service: Neurosurgery;  Laterality: N/A;   APPLICATION OF INTRAOPERATIVE CT SCAN N/A 09/19/2021   Procedure: APPLICATION OF INTRAOPERATIVE CT SCAN;  Surgeon: Clois Fret, MD;  Location: [facility];  Service: Neurosurgery;  Laterality: N/A;   APPLICATION OF WOUND VAC  09/19/2021   Procedure: APPLICATION OF WOUND VAC;  Surgeon: Clois Fret, MD;  Location: [facility];  Service: Neurosurgery;;  [facility]   BACK SURGERY     thoracic   CESAREAN SECTION  1971   CHOLECYSTECTOMY     COLONOSCOPY     ROTATOR CUFF REPAIR Right 2006   TOTAL KNEE ARTHROPLASTY Left 2014   TOTAL KNEE ARTHROPLASTY Right 09/11/2022   Procedure: TOTAL KNEE ARTHROPLASTY;  Surgeon: Lorelle Hussar, MD;  Location: [facility];  Service: Orthopedics;  Laterality: Right;   TUBAL LIGATION  1975    Social History   Socioeconomic History   Marital status: Married    Spouse name: Not on file   Number of children: Not on file   Years of education: Not on file   Highest education level: Not on file  Occupational History   Not on file  Tobacco Use   Smoking status: Never   Smokeless tobacco: Never  Vaping Use   Vaping status: Never Used  Substance and Sexual Activity   Alcohol use: Not Currently    Comment: rarely   Drug use: No   Sexual activity: Not Currently    Birth control/protection: Post-menopausal  Other Topics Concern   Not on file  Social History Narrative   Not on file   Social Drivers of Health   Financial Resource Strain: Low Risk  (05/09/2024)   Received from [facility]   Overall Financial Resource Strain (CARDIA)    Difficulty of Paying Living Expenses: Not hard at all  Food Insecurity: No Food Insecurity (05/09/2024)   Received from [facility]   Hunger Vital Sign    Within the past 12 months, you worried that your food would  run out before you got the money to buy more.: Never true    Within the past 12 months, the food you bought just didn't last and you didn't have money to get more.: Never true  Transportation Needs: No Transportation Needs (05/09/2024)   Received from [facility] - Transportation    In the past 12 months, has lack of transportation kept you from medical appointments or from getting medications?: No    Lack of Transportation (Non-Medical): No  Physical Activity: Not on file  Stress: Not on file  Social Connections: Not on file  Intimate Partner Violence: Not At Risk (09/11/2022)   Humiliation, Afraid, Rape, and Kick questionnaire    Fear of Current or Ex-Partner: No    Emotionally Abused: No    Physically Abused: No    Sexually Abused: No    History reviewed. No pertinent family history.  No Known Allergies  Outpatient Medications Prior to Visit  Medication Sig   acetaminophen  (TYLENOL ) 500 MG tablet Take 1,000 mg by mouth 3 (three) times daily.   amLODipine  (NORVASC ) 5 MG tablet Take 1 tablet (5 mg total) by mouth daily.   atenolol  (TENORMIN ) 100 MG tablet TAKE 1 TABLET BY MOUTH IN THE  MORNING   DULoxetine  (CYMBALTA ) 60 MG capsule TAKE 1 CAPSULE BY MOUTH IN THE  MORNING   furosemide  (LASIX ) 20 MG tablet Take 1 tablet by mouth once daily   gabapentin  (NEURONTIN ) 600 MG tablet TAKE 1 TABLET BY MOUTH TWICE  DAILY   hydrochlorothiazide  (HYDRODIURIL ) 25 MG tablet TAKE 1 TABLET BY MOUTH IN THE  MORNING   Lidocaine  4 % PTCH Place 1 patch onto the skin daily as needed (back pain).   losartan  (COZAAR ) 100 MG tablet TAKE 1 TABLET BY MOUTH AT  BEDTIME   meclizine  (ANTIVERT ) 25 MG tablet Take 1 tablet (25 mg total) by mouth 3 (three) times daily as needed for dizziness.   rosuvastatin  (CRESTOR ) 20 MG tablet Take 1 tablet (20 mg total) by mouth daily.   Semaglutide  (RYBELSUS ) 3 MG TABS Take 1 tablet (3 mg total) by mouth daily.   benzonatate  (TESSALON  PERLES) 100 MG  capsule Take 1 capsule (100 mg total) by mouth 3 (three) times daily as needed for cough. (Patient not taking: Reported on 05/09/2024)   cholestyramine  (QUESTRAN ) 4 g packet Take 1 packet by mouth 2 (two) times daily. (Patient not taking: Reported on 05/09/2024)   clotrimazole -betamethasone  (LOTRISONE ) cream APPLY 1 APPLICATION TOPICALLY  TWICE DAILY (Patient not taking: Reported on 05/09/2024)   empagliflozin  (JARDIANCE ) 10 MG TABS tablet Take 1 tablet (10 mg total) by mouth daily before breakfast. (Patient not taking: Reported on 05/09/2024)   Fe Fum-FA-B Cmp-C-Zn-Mg-Mn-Cu (HEMOCYTE-PLUS) 106-1 MG TABS Take 1 tablet by mouth daily. (Patient not taking: Reported on 05/09/2024)   ondansetron  (ZOFRAN -ODT) 4 MG disintegrating tablet Take 1 tablet (4 mg total) by mouth every 6 (six) hours as needed for nausea  or vomiting. (Patient not taking: Reported on 05/09/2024)   pantoprazole  (PROTONIX ) 40 MG tablet Take 1 tablet (40 mg total) by mouth daily. (Patient not taking: Reported on 05/09/2024)   No facility-administered medications prior to visit.    Review of Systems  Constitutional: Negative.  Negative for chills, fever and malaise/fatigue.  HENT: Negative.  Negative for congestion and sore throat.   Eyes: Negative.  Negative for blurred vision and pain.  Respiratory: Negative.  Negative for cough and shortness of breath.   Cardiovascular: Negative.  Negative for chest pain, palpitations and leg swelling.  Gastrointestinal: Negative.  Negative for abdominal pain, blood in stool, constipation, diarrhea, heartburn, melena, nausea and vomiting.  Genitourinary: Negative.  Negative for dysuria, flank pain, frequency and urgency.  Musculoskeletal: Negative.  Negative for joint pain and myalgias.  Skin: Negative.   Neurological: Negative.  Negative for dizziness, tingling, sensory change, weakness and headaches.  Endo/Heme/Allergies: Negative.   Psychiatric/Behavioral: Negative.  Negative for depression and  suicidal ideas. The patient is not nervous/anxious.        Objective:   BP 128/84   Pulse 74   Ht 5' 9 (1.753 m)   Wt 274 lb (124.3 kg)   SpO2 98%   BMI 40.46 kg/m   Vitals:   05/09/24 1450  BP: 128/84  Pulse: 74  Height: 5' 9 (1.753 m)  Weight: 274 lb (124.3 kg)  SpO2: 98%  BMI (Calculated): 40.44    Physical Exam Vitals and nursing note reviewed.  Constitutional:      Appearance: Normal appearance.  HENT:     Head: Normocephalic and atraumatic.     Nose: Nose normal.     Mouth/Throat:     Mouth: Mucous membranes are moist.     Pharynx: Oropharynx is clear.  Eyes:     Conjunctiva/sclera: Conjunctivae normal.     Pupils: Pupils are equal, round, and reactive to light.  Cardiovascular:     Rate and Rhythm: Normal rate and regular rhythm.     Pulses: Normal pulses.     Heart sounds: Normal heart sounds. No murmur heard. Pulmonary:     Effort: Pulmonary effort is normal.     Breath sounds: Normal breath sounds. No wheezing.  Abdominal:     General: Bowel sounds are normal.     Palpations: Abdomen is soft.     Tenderness: There is no abdominal tenderness. There is no right CVA tenderness or left CVA tenderness.  Musculoskeletal:        General: Normal range of motion.     Cervical back: Normal range of motion.     Right lower leg: No edema.     Left lower leg: No edema.  Skin:    General: Skin is warm and dry.  Neurological:     General: No focal deficit present.     Mental Status: She is alert and oriented to person, place, and time.  Psychiatric:        Mood and Affect: Mood normal.        Behavior: Behavior normal.      No results found for any visits on 05/09/24.  Recent Results (from the past 2160 hours)  CMP14+EGFR     Status: Abnormal   Collection Time: 03/03/24 11:50 AM  Result Value Ref Range   Glucose 100 (H) 70 - 99 mg/dL   BUN 15 8 - 27 mg/dL   Creatinine, Ser 8.76 (H) 0.57 - 1.00 mg/dL   eGFR 46 (L) >40 fO/fpw/8.26  BUN/Creatinine Ratio 12 12 - 28   Sodium 141 134 - 144 mmol/L   Potassium 4.4 3.5 - 5.2 mmol/L   Chloride 100 96 - 106 mmol/L   CO2 26 20 - 29 mmol/L   Calcium  10.6 (H) 8.7 - 10.3 mg/dL   Total Protein 7.1 6.0 - 8.5 g/dL   Albumin 4.3 3.8 - 4.8 g/dL   Globulin, Total 2.8 1.5 - 4.5 g/dL   Bilirubin Total 0.3 0.0 - 1.2 mg/dL   Alkaline Phosphatase 72 44 - 121 IU/L   AST 26 0 - 40 IU/L   ALT 25 0 - 32 IU/L  Lipid Panel w/o Chol/HDL Ratio     Status: Abnormal   Collection Time: 03/03/24 11:50 AM  Result Value Ref Range   Cholesterol, Total 188 100 - 199 mg/dL   Triglycerides 660 (H) 0 - 149 mg/dL   HDL 54 >60 mg/dL   VLDL Cholesterol Cal 54 (H) 5 - 40 mg/dL   LDL Chol Calc (NIH) 80 0 - 99 mg/dL  CBC with Diff     Status: None   Collection Time: 03/03/24 11:50 AM  Result Value Ref Range   WBC 6.1 3.4 - 10.8 x10E3/uL   RBC 3.78 3.77 - 5.28 x10E6/uL   Hemoglobin 11.2 11.1 - 15.9 g/dL   Hematocrit 64.6 65.9 - 46.6 %   MCV 93 79 - 97 fL   MCH 29.6 26.6 - 33.0 pg   MCHC 31.7 31.5 - 35.7 g/dL   RDW 86.9 88.2 - 84.5 %   Platelets 236 150 - 450 x10E3/uL   Neutrophils 56 Not Estab. %   Lymphs 28 Not Estab. %   Monocytes 9 Not Estab. %   Eos 6 Not Estab. %   Basos 1 Not Estab. %   Neutrophils Absolute 3.4 1.4 - 7.0 x10E3/uL   Lymphocytes Absolute 1.7 0.7 - 3.1 x10E3/uL   Monocytes Absolute 0.6 0.1 - 0.9 x10E3/uL   EOS (ABSOLUTE) 0.4 0.0 - 0.4 x10E3/uL   Basophils Absolute 0.1 0.0 - 0.2 x10E3/uL   Immature Granulocytes 0 Not Estab. %   Immature Grans (Abs) 0.0 0.0 - 0.1 x10E3/uL  Hemoglobin A1c     Status: Abnormal   Collection Time: 03/03/24 11:50 AM  Result Value Ref Range   Hgb A1c MFr Bld 6.7 (H) 4.8 - 5.6 %    Comment:          Prediabetes: 5.7 - 6.4          Diabetes: >6.4          Glycemic control for adults with diabetes: <7.0    Est. average glucose Bld gHb Est-mCnc 146 mg/dL  Basic metabolic panel with GFR     Status: Abnormal   Collection Time: 04/14/24  1:04 PM   Result Value Ref Range   Glucose 99 70 - 99 mg/dL   BUN 12 8 - 27 mg/dL   Creatinine, Ser 8.81 (H) 0.57 - 1.00 mg/dL   eGFR 49 (L) >40 fO/fpw/8.26   BUN/Creatinine Ratio 10 (L) 12 - 28   Sodium 141 134 - 144 mmol/L   Potassium 4.4 3.5 - 5.2 mmol/L   Chloride 101 96 - 106 mmol/L   CO2 25 20 - 29 mmol/L   Calcium  10.2 8.7 - 10.3 mg/dL      Assessment & Plan:  Continue medications as prescribed.  Start glimepiride  2 mg once daily. Keep upcoming echo and cardiology appointments. Will check routine fasting labs at next visit. Referral  to nutritionist sent. Encouraged strict diet and exercise as tolerated. 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, without long-term current use of insulin (HCC) - Primary   Relevant Medications   glimepiride  (AMARYL ) 2 MG tablet   Other Relevant Orders   Amb ref to Medical Nutrition Therapy-MNT   Hemoglobin A1c   Combined hyperlipidemia associated with type 2 diabetes mellitus (HCC)   Relevant Medications   glimepiride  (AMARYL ) 2 MG tablet   Other Relevant Orders   Amb ref to Medical Nutrition Therapy-MNT   CBC with Diff   CMP14+EGFR   Lipid Panel w/o Chol/HDL Ratio   Hypertension associated with diabetes (HCC)   Relevant Medications   glimepiride  (AMARYL ) 2 MG tablet   Other Relevant Orders   Amb ref to Medical Nutrition Therapy-MNT   CBC with Diff   CMP14+EGFR   Lipid Panel w/o Chol/HDL Ratio   Morbid obesity with BMI of 40.0-44.9, adult (HCC)   Relevant Medications   glimepiride  (AMARYL ) 2 MG tablet    Return in about 6 weeks (around 06/20/2024).   Total time spent: 25 minutes  FERNAND FREDY RAMAN, MD  05/09/2024   This document may have been prepared by [facility] Voice Recognition software and as such may include unintentional dictation errors.

## 2024-05-12 ENCOUNTER — Other Ambulatory Visit: Payer: Self-pay | Admitting: Orthopedic Surgery

## 2024-05-12 ENCOUNTER — Institutional Professional Consult (permissible substitution): Admitting: Cardiovascular Disease

## 2024-05-12 DIAGNOSIS — M6281 Muscle weakness (generalized): Secondary | ICD-10-CM | POA: Diagnosis not present

## 2024-05-12 DIAGNOSIS — T8484XA Pain due to internal orthopedic prosthetic devices, implants and grafts, initial encounter: Secondary | ICD-10-CM

## 2024-05-14 DIAGNOSIS — M6281 Muscle weakness (generalized): Secondary | ICD-10-CM | POA: Diagnosis not present

## 2024-05-14 DIAGNOSIS — M545 Low back pain, unspecified: Secondary | ICD-10-CM | POA: Diagnosis not present

## 2024-05-16 ENCOUNTER — Ambulatory Visit

## 2024-05-16 DIAGNOSIS — I371 Nonrheumatic pulmonary valve insufficiency: Secondary | ICD-10-CM

## 2024-05-16 DIAGNOSIS — I340 Nonrheumatic mitral (valve) insufficiency: Secondary | ICD-10-CM

## 2024-05-16 DIAGNOSIS — R9431 Abnormal electrocardiogram [ECG] [EKG]: Secondary | ICD-10-CM

## 2024-05-16 DIAGNOSIS — I361 Nonrheumatic tricuspid (valve) insufficiency: Secondary | ICD-10-CM | POA: Diagnosis not present

## 2024-05-19 ENCOUNTER — Encounter: Payer: Self-pay | Admitting: Cardiovascular Disease

## 2024-05-19 ENCOUNTER — Ambulatory Visit (INDEPENDENT_AMBULATORY_CARE_PROVIDER_SITE_OTHER): Admitting: Cardiovascular Disease

## 2024-05-19 VITALS — BP 158/80 | HR 69 | Ht 69.0 in | Wt 268.0 lb

## 2024-05-19 DIAGNOSIS — I43 Cardiomyopathy in diseases classified elsewhere: Secondary | ICD-10-CM | POA: Diagnosis not present

## 2024-05-19 DIAGNOSIS — E1159 Type 2 diabetes mellitus with other circulatory complications: Secondary | ICD-10-CM

## 2024-05-19 DIAGNOSIS — R9431 Abnormal electrocardiogram [ECG] [EKG]: Secondary | ICD-10-CM

## 2024-05-19 DIAGNOSIS — E782 Mixed hyperlipidemia: Secondary | ICD-10-CM

## 2024-05-19 DIAGNOSIS — I110 Hypertensive heart disease with heart failure: Secondary | ICD-10-CM | POA: Diagnosis not present

## 2024-05-19 DIAGNOSIS — I152 Hypertension secondary to endocrine disorders: Secondary | ICD-10-CM

## 2024-05-19 DIAGNOSIS — Z6841 Body Mass Index (BMI) 40.0 and over, adult: Secondary | ICD-10-CM

## 2024-05-19 DIAGNOSIS — K219 Gastro-esophageal reflux disease without esophagitis: Secondary | ICD-10-CM

## 2024-05-19 DIAGNOSIS — R0789 Other chest pain: Secondary | ICD-10-CM | POA: Diagnosis not present

## 2024-05-19 DIAGNOSIS — I10 Essential (primary) hypertension: Secondary | ICD-10-CM | POA: Diagnosis not present

## 2024-05-19 MED ORDER — AMLODIPINE BESYLATE 10 MG PO TABS: 10.0000 mg | ORAL_TABLET | Freq: Every day | ORAL | 11 refills | Status: DC

## 2024-05-19 MED ORDER — SACUBITRIL-VALSARTAN 24-26 MG PO TABS
1.0000 | ORAL_TABLET | Freq: Two times a day (BID) | ORAL | 2 refills | Status: DC
Start: 2024-05-19 — End: 2024-07-04

## 2024-05-19 MED ORDER — CARVEDILOL 25 MG PO TABS: 25.0000 mg | ORAL_TABLET | Freq: Two times a day (BID) | ORAL | 11 refills | Status: DC

## 2024-05-19 MED ORDER — SPIRONOLACTONE 25 MG PO TABS: 12.5000 mg | ORAL_TABLET | Freq: Every day | ORAL | 11 refills | Status: DC

## 2024-05-19 NOTE — Patient Instructions (Signed)
 Stop atenolol , hydrochlorothiazide , losartan  and norvasc . Take instead entresto, aldactone, and coreg.

## 2024-05-19 NOTE — Progress Notes (Signed)
 Cardiology Office Note   Date:  05/19/2024   ID:  Paytyn, Mesta 1951-03-08, MRN [id number]  PCP:  Fernand Fredy RAMAN, MD  Cardiologist:  Denyse Fernand, MD      History of Present Illness: Diana Green is a 73 y.o. female who presents for  Chief Complaint  Patient presents with   Consult    Abnormal EKG    At home BP 120/79, But is SOB.      Past Medical History:  Diagnosis Date   Arthritis    Depression    Essential hypertension, benign 10/09/2022   GERD (gastroesophageal reflux disease)    occ no meds   High cholesterol    Hypertension    Impaired glucose tolerance 10/09/2022   Iron deficiency anemia 10/09/2022   Vitamin D  deficiency 10/09/2022     Past Surgical History:  Procedure Laterality Date   ANTERIOR CERVICAL DECOMP/DISCECTOMY FUSION N/A 07/08/2021   Procedure: C3-4 ANTERIOR CERVICAL DECOMPRESSION/DISCECTOMY FUSION 1 LEVEL WITH HEDRON;  Surgeon: Clois Fret, MD;  Location: [facility];  Service: Neurosurgery;  Laterality: N/A;   APPLICATION OF INTRAOPERATIVE CT SCAN N/A 09/19/2021   Procedure: APPLICATION OF INTRAOPERATIVE CT SCAN;  Surgeon: Clois Fret, MD;  Location: [facility];  Service: Neurosurgery;  Laterality: N/A;   APPLICATION OF WOUND VAC  09/19/2021   Procedure: APPLICATION OF WOUND VAC;  Surgeon: Clois Fret, MD;  Location: [facility];  Service: Neurosurgery;;  [facility]   BACK SURGERY     thoracic   CESAREAN SECTION  1971   CHOLECYSTECTOMY     COLONOSCOPY     ROTATOR CUFF REPAIR Right 2006   TOTAL KNEE ARTHROPLASTY Left 2014   TOTAL KNEE ARTHROPLASTY Right 09/11/2022   Procedure: TOTAL KNEE ARTHROPLASTY;  Surgeon: Lorelle Hussar, MD;  Location: [facility];  Service: Orthopedics;  Laterality: Right;   TUBAL LIGATION  1975     Current Outpatient Medications  Medication Sig Dispense Refill   acetaminophen  (TYLENOL ) 500 MG tablet Take 1,000 mg by mouth 3 (three) times daily.     carvedilol (COREG) 25 MG tablet Take 1 tablet  (25 mg total) by mouth 2 (two) times daily. 60 tablet 11   DULoxetine  (CYMBALTA ) 60 MG capsule TAKE 1 CAPSULE BY MOUTH IN THE  MORNING 90 capsule 3   Fe Fum-FA-B Cmp-C-Zn-Mg-Mn-Cu (HEMOCYTE-PLUS) 106-1 MG TABS Take 1 tablet by mouth daily. 90 tablet 3   furosemide  (LASIX ) 20 MG tablet Take 1 tablet by mouth once daily 30 tablet 3   gabapentin  (NEURONTIN ) 600 MG tablet TAKE 1 TABLET BY MOUTH TWICE  DAILY 200 tablet 2   glimepiride  (AMARYL ) 2 MG tablet Take 1 tablet (2 mg total) by mouth daily with breakfast. 90 tablet 3   Lidocaine  4 % PTCH Place 1 patch onto the skin daily as needed (back pain).     meclizine  (ANTIVERT ) 25 MG tablet Take 1 tablet (25 mg total) by mouth 3 (three) times daily as needed for dizziness. 30 tablet 0   rosuvastatin  (CRESTOR ) 20 MG tablet Take 1 tablet (20 mg total) by mouth daily. 90 tablet 3   sacubitril-valsartan (ENTRESTO) 24-26 MG Take 1 tablet by mouth 2 (two) times daily. 60 tablet 2   Semaglutide  (RYBELSUS ) 3 MG TABS Take 1 tablet (3 mg total) by mouth daily. 30 tablet 0   spironolactone (ALDACTONE) 25 MG tablet Take 0.5 tablets (12.5 mg total) by mouth daily. 30 tablet 11   No current facility-administered medications for this visit.  Allergies:   Patient has no known allergies.    Social History:   reports that she has never smoked. She has never used smokeless tobacco. She reports that she does not currently use alcohol. She reports that she does not use drugs.   Family History:  family history is not on file.    ROS:     Review of Systems  Constitutional: Negative.   HENT: Negative.    Eyes: Negative.   Respiratory: Negative.    Gastrointestinal: Negative.   Genitourinary: Negative.   Musculoskeletal: Negative.   Skin: Negative.   Neurological: Negative.   Endo/Heme/Allergies: Negative.   Psychiatric/Behavioral: Negative.    All other systems reviewed and are negative.     All other systems are reviewed and negative.    PHYSICAL  EXAM: VS:  BP (!) 158/80   Pulse 69   Ht 5' 9 (1.753 m)   Wt 268 lb (121.6 kg)   SpO2 96%   BMI 39.58 kg/m  , BMI Body mass index is 39.58 kg/m. Last weight:  Wt Readings from Last 3 Encounters:  05/19/24 268 lb (121.6 kg)  05/09/24 274 lb (124.3 kg)  04/14/24 277 lb (125.6 kg)     Physical Exam Constitutional:      Appearance: Normal appearance.  Cardiovascular:     Rate and Rhythm: Normal rate and regular rhythm.     Heart sounds: Normal heart sounds.  Pulmonary:     Effort: Pulmonary effort is normal.     Breath sounds: Normal breath sounds.  Musculoskeletal:     Right lower leg: No edema.     Left lower leg: No edema.  Neurological:     Mental Status: She is alert.       EKG:   Recent Labs: 03/03/2024: ALT 25; Hemoglobin 11.2; Platelets 236 04/14/2024: BUN 12; Creatinine, Ser 1.18; Potassium 4.4; Sodium 141    Lipid Panel    Component Value Date/Time   CHOL 188 03/03/2024 1150   TRIG 339 (H) 03/03/2024 1150   HDL 54 03/03/2024 1150   CHOLHDL 3.4 01/08/2023 1141   LDLCALC 80 03/03/2024 1150      Other studies Reviewed: Additional studies/ records that were reviewed today include:  Review of the above records demonstrates:       No data to display            ASSESSMENT AND PLAN:    ICD-10-CM   1. Other chest pain  R07.89    Advise stress test since LVEF 31%.    2. Essential hypertension, benign  I10 sacubitril-valsartan (ENTRESTO) 24-26 MG    carvedilol (COREG) 25 MG tablet    MYOCARDIAL PERFUSION IMAGING    spironolactone (ALDACTONE) 25 MG tablet    DISCONTINUED: amLODipine  (NORVASC ) 10 MG tablet    3. Hypertension associated with diabetes (HCC)  E11.59 sacubitril-valsartan (ENTRESTO) 24-26 MG   I15.2 carvedilol (COREG) 25 MG tablet    MYOCARDIAL PERFUSION IMAGING    spironolactone (ALDACTONE) 25 MG tablet    DISCONTINUED: amLODipine  (NORVASC ) 10 MG tablet    4. Gastroesophageal reflux disease without esophagitis  K21.9  sacubitril-valsartan (ENTRESTO) 24-26 MG    carvedilol (COREG) 25 MG tablet    MYOCARDIAL PERFUSION IMAGING    spironolactone (ALDACTONE) 25 MG tablet    DISCONTINUED: amLODipine  (NORVASC ) 10 MG tablet    5. Abnormal EKG  R94.31 sacubitril-valsartan (ENTRESTO) 24-26 MG    carvedilol (COREG) 25 MG tablet    MYOCARDIAL PERFUSION IMAGING    spironolactone (  ALDACTONE) 25 MG tablet    DISCONTINUED: amLODipine  (NORVASC ) 10 MG tablet    6. Mixed hyperlipidemia  E78.2 sacubitril-valsartan (ENTRESTO) 24-26 MG    carvedilol (COREG) 25 MG tablet    MYOCARDIAL PERFUSION IMAGING    spironolactone (ALDACTONE) 25 MG tablet    DISCONTINUED: amLODipine  (NORVASC ) 10 MG tablet    7. Morbid obesity with BMI of 40.0-44.9, adult (HCC)  E66.01 sacubitril-valsartan (ENTRESTO) 24-26 MG   Z68.41 carvedilol (COREG) 25 MG tablet    MYOCARDIAL PERFUSION IMAGING    spironolactone (ALDACTONE) 25 MG tablet    DISCONTINUED: amLODipine  (NORVASC ) 10 MG tablet    8. Cardiomyopathy due to hypertension, with heart failure (HCC)  I11.0 sacubitril-valsartan (ENTRESTO) 24-26 MG   I43 carvedilol (COREG) 25 MG tablet    MYOCARDIAL PERFUSION IMAGING    spironolactone (ALDACTONE) 25 MG tablet   Has LVEF 31 %, severely reduced. Advise entresto, coreg, and aldactone. Stop norvac,HCTZ and losartan  and atenolol .       Problem List Items Addressed This Visit       Cardiovascular and Mediastinum   Essential hypertension, benign   Relevant Medications   sacubitril-valsartan (ENTRESTO) 24-26 MG   carvedilol (COREG) 25 MG tablet   spironolactone (ALDACTONE) 25 MG tablet   Other Relevant Orders   MYOCARDIAL PERFUSION IMAGING   Hypertension associated with diabetes (HCC)   Relevant Medications   sacubitril-valsartan (ENTRESTO) 24-26 MG   carvedilol (COREG) 25 MG tablet   spironolactone (ALDACTONE) 25 MG tablet   Other Relevant Orders   MYOCARDIAL PERFUSION IMAGING     Digestive   Gastroesophageal reflux disease without  esophagitis   Relevant Medications   sacubitril-valsartan (ENTRESTO) 24-26 MG   carvedilol (COREG) 25 MG tablet   spironolactone (ALDACTONE) 25 MG tablet   Other Relevant Orders   MYOCARDIAL PERFUSION IMAGING     Other   Mixed hyperlipidemia   Relevant Medications   sacubitril-valsartan (ENTRESTO) 24-26 MG   carvedilol (COREG) 25 MG tablet   spironolactone (ALDACTONE) 25 MG tablet   Other Relevant Orders   MYOCARDIAL PERFUSION IMAGING   Abnormal EKG   Relevant Medications   sacubitril-valsartan (ENTRESTO) 24-26 MG   carvedilol (COREG) 25 MG tablet   spironolactone (ALDACTONE) 25 MG tablet   Other Relevant Orders   MYOCARDIAL PERFUSION IMAGING   Morbid obesity with BMI of 40.0-44.9, adult (HCC)   Relevant Medications   sacubitril-valsartan (ENTRESTO) 24-26 MG   carvedilol (COREG) 25 MG tablet   spironolactone (ALDACTONE) 25 MG tablet   Other Relevant Orders   MYOCARDIAL PERFUSION IMAGING   Other Visit Diagnoses       Other chest pain    -  Primary   Advise stress test since LVEF 31%.     Cardiomyopathy due to hypertension, with heart failure (HCC)       Has LVEF 31 %, severely reduced. Advise entresto, coreg, and aldactone. Stop norvac,HCTZ and losartan  and atenolol .   Relevant Medications   sacubitril-valsartan (ENTRESTO) 24-26 MG   carvedilol (COREG) 25 MG tablet   spironolactone (ALDACTONE) 25 MG tablet   Other Relevant Orders   MYOCARDIAL PERFUSION IMAGING          Disposition:   Return in about 4 weeks (around 06/16/2024) for stress test and f/u.    Total time spent: 35 minutes  Signed,  Denyse Bathe, MD  05/19/2024 11:21 AM    [facility]

## 2024-05-20 ENCOUNTER — Telehealth: Payer: Self-pay

## 2024-05-20 NOTE — Telephone Encounter (Signed)
 Pt states the rx she got prescribed yesterday is too expensive. Wanting an alternative. Pt did not say which ones.

## 2024-05-22 NOTE — Telephone Encounter (Signed)
Pt informed

## 2024-05-26 ENCOUNTER — Ambulatory Visit: Admitting: Internal Medicine

## 2024-05-26 ENCOUNTER — Encounter: Attending: Internal Medicine | Admitting: Dietician

## 2024-05-26 DIAGNOSIS — E1169 Type 2 diabetes mellitus with other specified complication: Secondary | ICD-10-CM | POA: Insufficient documentation

## 2024-05-26 DIAGNOSIS — I152 Hypertension secondary to endocrine disorders: Secondary | ICD-10-CM | POA: Diagnosis not present

## 2024-05-26 DIAGNOSIS — E782 Mixed hyperlipidemia: Secondary | ICD-10-CM | POA: Insufficient documentation

## 2024-05-26 DIAGNOSIS — E0800 Diabetes mellitus due to underlying condition with hyperosmolarity without nonketotic hyperglycemic-hyperosmolar coma (NKHHC): Secondary | ICD-10-CM

## 2024-05-26 DIAGNOSIS — E1159 Type 2 diabetes mellitus with other circulatory complications: Secondary | ICD-10-CM | POA: Insufficient documentation

## 2024-05-26 DIAGNOSIS — Z713 Dietary counseling and surveillance: Secondary | ICD-10-CM | POA: Insufficient documentation

## 2024-05-26 NOTE — Progress Notes (Signed)
 Diabetes Self-Management Education  Visit Type: First/Initial  Appt. Start Time: 1630 Appt. End Time: 1830  05/26/2024  [name], identified by name and date of birth, is a 73 y.o. female with a diagnosis of Diabetes: Type 2.   ASSESSMENT  There were no vitals taken for this visit. There is no height or weight on file to calculate BMI.   Diabetes Self-Management Education - 05/26/24 1943       Visit Information   Visit Type First/Initial      Initial Visit   Diabetes Type Type 2    Are you taking your medications as prescribed? Yes      Health Coping   How would you rate your overall health? Fair      Psychosocial Assessment   Patient Belief/Attitude about Diabetes Afraid   and denial   What is the hardest part about your diabetes right now, causing you the most concern, or is the most worrisome to you about your diabetes?   Taking/obtaining medications;Making healty food and beverage choices;Checking blood sugar    How often do you need to have someone help you when you read instructions, pamphlets, or other written materials from your doctor or pharmacy? 1 - Never    What is the last grade level you completed in school? GED      Pre-Education Assessment   Patient understands the diabetes disease and treatment process. Needs Instruction    Patient understands incorporating nutritional management into lifestyle. Needs Instruction    Patient undertands incorporating physical activity into lifestyle. Needs Instruction    Patient understands using medications safely. Needs Instruction    Patient understands monitoring blood glucose, interpreting and using results Needs Instruction    Patient understands prevention, detection, and treatment of acute complications. Needs Instruction    Patient understands prevention, detection, and treatment of chronic complications. Needs Instruction    Patient understands how to develop strategies to address psychosocial issues. Needs  Instruction    Patient understands how to develop strategies to promote health/change behavior. Needs Instruction      Complications   Last HgB A1C per patient/outside source 6.7 %   02/2024   How often do you check your blood sugar? 0 times/day (not testing)    Have you had a dilated eye exam in the past 12 months? Yes    Have you had a dental exam in the past 12 months? Yes    Are you checking your feet? Yes    How many days per week are you checking your feet? 2      Activity / Exercise   Activity / Exercise Type Light (walking / raking leaves)   PT   How many days per week do you exercise? 1    How many minutes per day do you exercise? 30    Total minutes per week of exercise 30      Patient Education   Previous Diabetes Education No    Disease Pathophysiology Definition of diabetes, type 1 and 2, and the diagnosis of diabetes;Factors that contribute to the development of diabetes;Explored patient's options for treatment of their diabetes    Healthy Eating Role of diet in the treatment of diabetes and the relationship between the three main macronutrients and blood glucose level;Plate Method;Reviewed blood glucose goals for pre and post meals and how to evaluate the patients' food intake on their blood glucose level.;Meal timing in regards to the patients' current diabetes medication.    Being Active Role  of exercise on diabetes management, blood pressure control and cardiac health.    Monitoring Purpose and frequency of SMBG.;Taught/discussed recording of test results and interpretation of SMBG.;Interpreting lab values - A1C, lipid, urine microalbumina.;Identified appropriate SMBG and/or A1C goals.    Acute complications Taught prevention, symptoms, and  treatment of hypoglycemia - the 15 rule.;Discussed and identified patients' prevention, symptoms, and treatment of hyperglycemia.    Chronic complications Relationship between chronic complications and blood glucose control;Lipid levels,  blood glucose control and heart disease    Diabetes Stress and Support Role of stress on diabetes      Outcomes   Expected Outcomes Demonstrated interest in learning. Expect positive outcomes    Future DMSE --   1 week         Individualized Plan for Diabetes Self-Management Training:   Learning Objective:  Patient will have a greater understanding of diabetes self-management. Patient education plan is to attend individual and/or group sessions per assessed needs and concerns.   Plan:   There are no Patient Instructions on file for this visit.  Expected Outcomes:  Demonstrated interest in learning. Expect positive outcomes  Education material provided: ADA - How to Thrive: A Guide for Your Journey with Diabetes, A1C conversion sheet, My Plate, and Diabetes Resources  If problems or questions, patient to contact team via:  Phone  Future DSME appointment:  (1 week)

## 2024-05-29 ENCOUNTER — Ambulatory Visit

## 2024-05-29 DIAGNOSIS — K219 Gastro-esophageal reflux disease without esophagitis: Secondary | ICD-10-CM

## 2024-05-29 DIAGNOSIS — I43 Cardiomyopathy in diseases classified elsewhere: Secondary | ICD-10-CM | POA: Diagnosis not present

## 2024-05-29 DIAGNOSIS — M6281 Muscle weakness (generalized): Secondary | ICD-10-CM | POA: Diagnosis not present

## 2024-05-29 DIAGNOSIS — M545 Low back pain, unspecified: Secondary | ICD-10-CM | POA: Diagnosis not present

## 2024-05-29 DIAGNOSIS — I10 Essential (primary) hypertension: Secondary | ICD-10-CM

## 2024-05-29 DIAGNOSIS — R9431 Abnormal electrocardiogram [ECG] [EKG]: Secondary | ICD-10-CM

## 2024-05-29 DIAGNOSIS — E1159 Type 2 diabetes mellitus with other circulatory complications: Secondary | ICD-10-CM

## 2024-05-29 DIAGNOSIS — E782 Mixed hyperlipidemia: Secondary | ICD-10-CM

## 2024-05-29 DIAGNOSIS — I110 Hypertensive heart disease with heart failure: Secondary | ICD-10-CM

## 2024-05-29 MED ORDER — TECHNETIUM TC 99M SESTAMIBI GENERIC - CARDIOLITE
11.2000 | Freq: Once | INTRAVENOUS | Status: AC | PRN
  Administered 2024-05-29: 11.2 via INTRAVENOUS

## 2024-05-29 MED ORDER — TECHNETIUM TC 99M SESTAMIBI GENERIC - CARDIOLITE
31.4000 | Freq: Once | INTRAVENOUS | Status: AC | PRN
Start: 2024-05-29 — End: 2024-05-29
  Administered 2024-05-29: 31.4 via INTRAVENOUS

## 2024-06-02 ENCOUNTER — Encounter: Admitting: Dietician

## 2024-06-02 DIAGNOSIS — E0800 Diabetes mellitus due to underlying condition with hyperosmolarity without nonketotic hyperglycemic-hyperosmolar coma (NKHHC): Secondary | ICD-10-CM

## 2024-06-02 DIAGNOSIS — E1169 Type 2 diabetes mellitus with other specified complication: Secondary | ICD-10-CM | POA: Diagnosis not present

## 2024-06-02 NOTE — Progress Notes (Signed)
 Patient was seen on 06/02/24 for the second of a series of three diabetes self-management courses at the [facility]. The following learning objectives were met by the patient during this class:  Describe the role of different macronutrients on glucose Explain how carbohydrates affect blood glucose State what foods contain the most carbohydrates Demonstrate carbohydrate counting Demonstrate how to read Nutrition Facts food label Describe effects of various fats on heart health Describe the importance of good nutrition for health and healthy eating strategies Describe techniques for managing your shopping, cooking and meal planning List strategies to follow meal plan when dining out Describe the effects of alcohol  on glucose and how to use it safely  Goals:  Follow Diabetes Meal Plan as instructed  Aim to spread carbs evenly throughout the day  Aim for 3 meals per day and snacks as needed Include lean protein foods to meals/snacks  Monitor glucose levels as instructed by your doctor   Follow-Up Plan: Attend Core 3 Work towards following your personal food plan.

## 2024-06-03 DIAGNOSIS — M6281 Muscle weakness (generalized): Secondary | ICD-10-CM | POA: Diagnosis not present

## 2024-06-03 DIAGNOSIS — M545 Low back pain, unspecified: Secondary | ICD-10-CM | POA: Diagnosis not present

## 2024-06-05 ENCOUNTER — Ambulatory Visit (INDEPENDENT_AMBULATORY_CARE_PROVIDER_SITE_OTHER): Admitting: Cardiovascular Disease

## 2024-06-05 ENCOUNTER — Encounter: Payer: Self-pay | Admitting: Cardiovascular Disease

## 2024-06-05 VITALS — BP 126/68 | HR 66 | Ht 69.0 in | Wt 275.6 lb

## 2024-06-05 DIAGNOSIS — R609 Edema, unspecified: Secondary | ICD-10-CM

## 2024-06-05 DIAGNOSIS — M6281 Muscle weakness (generalized): Secondary | ICD-10-CM | POA: Diagnosis not present

## 2024-06-05 DIAGNOSIS — M545 Low back pain, unspecified: Secondary | ICD-10-CM | POA: Diagnosis not present

## 2024-06-05 DIAGNOSIS — R0602 Shortness of breath: Secondary | ICD-10-CM

## 2024-06-05 DIAGNOSIS — Z6841 Body Mass Index (BMI) 40.0 and over, adult: Secondary | ICD-10-CM

## 2024-06-05 DIAGNOSIS — I43 Cardiomyopathy in diseases classified elsewhere: Secondary | ICD-10-CM

## 2024-06-05 DIAGNOSIS — E1159 Type 2 diabetes mellitus with other circulatory complications: Secondary | ICD-10-CM

## 2024-06-05 DIAGNOSIS — E782 Mixed hyperlipidemia: Secondary | ICD-10-CM

## 2024-06-05 DIAGNOSIS — I152 Hypertension secondary to endocrine disorders: Secondary | ICD-10-CM

## 2024-06-05 DIAGNOSIS — I5021 Acute systolic (congestive) heart failure: Secondary | ICD-10-CM

## 2024-06-05 DIAGNOSIS — I110 Hypertensive heart disease with heart failure: Secondary | ICD-10-CM

## 2024-06-05 DIAGNOSIS — R9431 Abnormal electrocardiogram [ECG] [EKG]: Secondary | ICD-10-CM

## 2024-06-05 MED ORDER — SPIRONOLACTONE 25 MG PO TABS: 25.0000 mg | ORAL_TABLET | Freq: Every day | ORAL | 11 refills | Status: DC

## 2024-06-05 MED ORDER — LOSARTAN POTASSIUM 100 MG PO TABS: 100.0000 mg | ORAL_TABLET | Freq: Every day | ORAL | 11 refills | Status: DC

## 2024-06-05 MED ORDER — FUROSEMIDE 40 MG PO TABS: 40.0000 mg | ORAL_TABLET | Freq: Every day | ORAL | 11 refills | Status: DC

## 2024-06-05 NOTE — Progress Notes (Signed)
 Cardiology Office Note   Date:  06/05/2024   ID:  Diana Green, DOB Dec 06, 1950, MRN [id number]  PCP:  Fernand Fredy RAMAN, MD  Cardiologist:  Denyse Fernand, MD      History of Present Illness: Diana Green is a 73 y.o. female who presents for  Chief Complaint  Patient presents with   Follow-up    4 week NST results    HPI    Past Medical History:  Diagnosis Date   Arthritis    Depression    Essential hypertension, benign 10/09/2022   GERD (gastroesophageal reflux disease)    occ no meds   High cholesterol    Hypertension    Impaired glucose tolerance 10/09/2022   Iron deficiency anemia 10/09/2022   Vitamin D  deficiency 10/09/2022     Past Surgical History:  Procedure Laterality Date   ANTERIOR CERVICAL DECOMP/DISCECTOMY FUSION N/A 07/08/2021   Procedure: C3-4 ANTERIOR CERVICAL DECOMPRESSION/DISCECTOMY FUSION 1 LEVEL WITH HEDRON;  Surgeon: Clois Fret, MD;  Location: [facility];  Service: Neurosurgery;  Laterality: N/A;   APPLICATION OF INTRAOPERATIVE CT SCAN N/A 09/19/2021   Procedure: APPLICATION OF INTRAOPERATIVE CT SCAN;  Surgeon: Clois Fret, MD;  Location: [facility];  Service: Neurosurgery;  Laterality: N/A;   APPLICATION OF WOUND VAC  09/19/2021   Procedure: APPLICATION OF WOUND VAC;  Surgeon: Clois Fret, MD;  Location: [facility];  Service: Neurosurgery;;  [facility]   BACK SURGERY     thoracic   CESAREAN SECTION  1971   CHOLECYSTECTOMY     COLONOSCOPY     ROTATOR CUFF REPAIR Right 2006   TOTAL KNEE ARTHROPLASTY Left 2014   TOTAL KNEE ARTHROPLASTY Right 09/11/2022   Procedure: TOTAL KNEE ARTHROPLASTY;  Surgeon: Lorelle Hussar, MD;  Location: [facility];  Service: Orthopedics;  Laterality: Right;   TUBAL LIGATION  1975     Current Outpatient Medications  Medication Sig Dispense Refill   acetaminophen  (TYLENOL ) 500 MG tablet Take 1,000 mg by mouth 3 (three) times daily.     carvedilol (COREG) 25 MG tablet Take 1 tablet (25 mg total) by  mouth 2 (two) times daily. 60 tablet 11   DULoxetine  (CYMBALTA ) 60 MG capsule TAKE 1 CAPSULE BY MOUTH IN THE  MORNING 90 capsule 3   furosemide  (LASIX ) 40 MG tablet Take 1 tablet (40 mg total) by mouth daily. 30 tablet 11   gabapentin  (NEURONTIN ) 600 MG tablet TAKE 1 TABLET BY MOUTH TWICE  DAILY 200 tablet 2   glimepiride  (AMARYL ) 2 MG tablet Take 1 tablet (2 mg total) by mouth daily with breakfast. 90 tablet 3   Lidocaine  4 % PTCH Place 1 patch onto the skin daily as needed (back pain).     meclizine  (ANTIVERT ) 25 MG tablet Take 1 tablet (25 mg total) by mouth 3 (three) times daily as needed for dizziness. 30 tablet 0   rosuvastatin  (CRESTOR ) 20 MG tablet Take 1 tablet (20 mg total) by mouth daily. 90 tablet 3   sacubitril-valsartan (ENTRESTO) 24-26 MG Take 1 tablet by mouth 2 (two) times daily. 60 tablet 2   Semaglutide  (RYBELSUS ) 3 MG TABS Take 1 tablet (3 mg total) by mouth daily. 30 tablet 0   spironolactone (ALDACTONE) 25 MG tablet Take 1 tablet (25 mg total) by mouth daily. 30 tablet 11   No current facility-administered medications for this visit.    Allergies:   Patient has no known allergies.    Social History:   reports that she has never smoked.  She has never used smokeless tobacco. She reports that she does not currently use alcohol. She reports that she does not use drugs.   Family History:  family history is not on file.    ROS:     ROS    All other systems are reviewed and negative.    PHYSICAL EXAM: VS:  BP 126/68   Pulse 66   Ht 5' 9 (1.753 m)   Wt 275 lb 9.6 oz (125 kg)   SpO2 97%   BMI 40.70 kg/m  , BMI Body mass index is 40.7 kg/m. Last weight:  Wt Readings from Last 3 Encounters:  06/05/24 275 lb 9.6 oz (125 kg)  05/19/24 268 lb (121.6 kg)  05/09/24 274 lb (124.3 kg)     Physical Exam Constitutional:      Appearance: Normal appearance.  Cardiovascular:     Rate and Rhythm: Normal rate and regular rhythm.     Heart sounds: Normal heart sounds.   Pulmonary:     Effort: Pulmonary effort is normal.     Breath sounds: Normal breath sounds.  Musculoskeletal:     Right lower leg: No edema.     Left lower leg: No edema.  Neurological:     Mental Status: She is alert.       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 Hypertension associated with diabetes (HCC)  E11.59 spironolactone (ALDACTONE) 25 MG tablet   I15.2 furosemide  (LASIX ) 40 MG tablet    2. Abnormal EKG  R94.31 spironolactone (ALDACTONE) 25 MG tablet    furosemide  (LASIX ) 40 MG tablet    3. Mixed hyperlipidemia  E78.2 spironolactone (ALDACTONE) 25 MG tablet    furosemide  (LASIX ) 40 MG tablet    4. Morbid obesity with BMI of 40.0-44.9, adult (HCC)  E66.01 spironolactone (ALDACTONE) 25 MG tablet   Z68.41 furosemide  (LASIX ) 40 MG tablet    5. Cardiomyopathy due to hypertension, with heart failure (HCC)  I11.0 spironolactone (ALDACTONE) 25 MG tablet   I43 furosemide  (LASIX ) 40 MG tablet    6. SOB (shortness of breath)  R06.02 spironolactone (ALDACTONE) 25 MG tablet    furosemide  (LASIX ) 40 MG tablet   Stress test showed septal wall revesible defect. ECHO showed Severe LV systolic dysfunction with LVEF 31%. NO CHEST PAIN. Will treat medically.    7. Edema, unspecified type  R60.9 spironolactone (ALDACTONE) 25 MG tablet    furosemide  (LASIX ) 40 MG tablet    8. CHF (congestive heart failure), NYHA class I, acute,  systolic (HCC)  I50.21 spironolactone (ALDACTONE) 25 MG tablet    furosemide  (LASIX ) 40 MG tablet   INCREASE LASIX  TO 40 DAILY AND ALDACTONE TO 25 DAILY.       Problem List Items Addressed This Visit       Cardiovascular and Mediastinum   Hypertension associated  with diabetes (HCC) - Primary   Relevant Medications   spironolactone (ALDACTONE) 25 MG tablet   furosemide  (LASIX ) 40 MG tablet     Other   Mixed hyperlipidemia   Relevant Medications   spironolactone (ALDACTONE) 25 MG tablet   furosemide  (LASIX ) 40 MG tablet   Abnormal EKG   Relevant Medications   spironolactone (ALDACTONE) 25 MG tablet   furosemide  (LASIX ) 40 MG tablet   Morbid obesity with BMI of 40.0-44.9, adult (HCC)   Relevant Medications   spironolactone (ALDACTONE) 25 MG tablet   furosemide  (LASIX ) 40 MG tablet   Other Visit Diagnoses       Cardiomyopathy due to hypertension, with heart failure (HCC)       Relevant Medications   spironolactone (ALDACTONE) 25 MG tablet   furosemide  (LASIX ) 40 MG tablet     SOB (shortness of breath)       Stress test showed septal wall revesible defect. ECHO showed Severe LV systolic dysfunction with LVEF 31%. NO CHEST PAIN. Will treat medically.   Relevant Medications   spironolactone (ALDACTONE) 25 MG tablet   furosemide  (LASIX ) 40 MG tablet     Edema, unspecified type       Relevant Medications   spironolactone (ALDACTONE) 25 MG tablet   furosemide  (LASIX ) 40 MG tablet     CHF (congestive heart failure), NYHA class I, acute, systolic (HCC)       INCREASE LASIX  TO 40 DAILY AND ALDACTONE TO 25 DAILY.   Relevant Medications   spironolactone (ALDACTONE) 25 MG tablet   furosemide  (LASIX ) 40 MG tablet          Disposition:   Return in about 4 weeks (around 07/03/2024).    Total time spent: 40 minutes  Signed,  Denyse Bathe, MD  06/05/2024 1:54 PM    [facility]

## 2024-06-05 NOTE — Patient Instructions (Signed)
 TAKE LOSARTAN  100 MG, ALDACTONE 25 MG, LASIX  40 MG  DAILY AND TAKE COREG 25 TWICE A DAY.

## 2024-06-06 ENCOUNTER — Encounter: Payer: Self-pay | Admitting: Cardiovascular Disease

## 2024-06-09 ENCOUNTER — Encounter: Admitting: Dietician

## 2024-06-09 ENCOUNTER — Encounter
Admission: RE | Admit: 2024-06-09 | Discharge: 2024-06-09 | Disposition: A | Discharge status: Home / Self Care | Source: Ambulatory Visit | Attending: Orthopedic Surgery | Admitting: Orthopedic Surgery

## 2024-06-09 DIAGNOSIS — T8484XA Pain due to internal orthopedic prosthetic devices, implants and grafts, initial encounter: Secondary | ICD-10-CM | POA: Diagnosis present

## 2024-06-09 DIAGNOSIS — M25562 Pain in left knee: Secondary | ICD-10-CM | POA: Diagnosis not present

## 2024-06-09 DIAGNOSIS — E1169 Type 2 diabetes mellitus with other specified complication: Secondary | ICD-10-CM | POA: Diagnosis not present

## 2024-06-09 DIAGNOSIS — Z96652 Presence of left artificial knee joint: Secondary | ICD-10-CM | POA: Diagnosis not present

## 2024-06-09 DIAGNOSIS — E0800 Diabetes mellitus due to underlying condition with hyperosmolarity without nonketotic hyperglycemic-hyperosmolar coma (NKHHC): Secondary | ICD-10-CM

## 2024-06-09 MED ORDER — TECHNETIUM TC 99M MEDRONATE IV KIT
20.0000 | PACK | Freq: Once | INTRAVENOUS | Status: AC | PRN
Start: 2024-06-09 — End: 2024-06-09
  Administered 2024-06-09: 21.31 via INTRAVENOUS

## 2024-06-09 NOTE — Progress Notes (Signed)
 ELWANDA MOGER                                          MRN: [id number]   06/09/2024   The [facility] Quality Team Specialist reviewed this patient medical record for the purposes of chart review for care gap closure. The following were reviewed: abstraction for care gap closure-glycemic status assessment.    [facility] Quality Team

## 2024-06-09 NOTE — Progress Notes (Signed)
 Patient was seen on 06/09/24 for the third of a series of three diabetes self-management courses at [facility].   State the amount of activity recommended for healthy living Describe activities suitable for individual needs Identify ways to regularly incorporate activity into daily life Identify barriers to activity and ways to over come these barriers Identify diabetes medications being personally used and their primary action for lowering glucose and possible side effects Describe role of stress on blood glucose and develop strategies to address psychosocial issues Identify diabetes complications and ways to prevent them Explain how to manage diabetes during illness Evaluate success in meeting personal goal Establish 2-3 goals that they will plan to diligently work on  Goals:  I will count my carb choices at most meals and snacks I will be active ? minutes or more 5 times a week To help manage stress I will  ? at least 5 times a week  Your patient has identified these potential barriers to change:  Motivation  Your patient has identified their diabetes self-care support plan as  Reading diabetes resouces    Plan:  Return for 1:1 follow up visit in 3-4 months Attend Support Group as desired 06/09/22

## 2024-06-10 DIAGNOSIS — M545 Low back pain, unspecified: Secondary | ICD-10-CM | POA: Diagnosis not present

## 2024-06-10 DIAGNOSIS — M6281 Muscle weakness (generalized): Secondary | ICD-10-CM | POA: Diagnosis not present

## 2024-06-12 DIAGNOSIS — E785 Hyperlipidemia, unspecified: Secondary | ICD-10-CM | POA: Diagnosis not present

## 2024-06-12 DIAGNOSIS — I5022 Chronic systolic (congestive) heart failure: Secondary | ICD-10-CM | POA: Diagnosis not present

## 2024-06-12 DIAGNOSIS — I447 Left bundle-branch block, unspecified: Secondary | ICD-10-CM | POA: Diagnosis not present

## 2024-06-12 DIAGNOSIS — I10 Essential (primary) hypertension: Secondary | ICD-10-CM | POA: Diagnosis not present

## 2024-06-12 DIAGNOSIS — I5189 Other ill-defined heart diseases: Secondary | ICD-10-CM | POA: Diagnosis not present

## 2024-06-12 DIAGNOSIS — E119 Type 2 diabetes mellitus without complications: Secondary | ICD-10-CM | POA: Diagnosis not present

## 2024-06-12 DIAGNOSIS — M545 Low back pain, unspecified: Secondary | ICD-10-CM | POA: Diagnosis not present

## 2024-06-12 DIAGNOSIS — R9439 Abnormal result of other cardiovascular function study: Secondary | ICD-10-CM | POA: Diagnosis not present

## 2024-06-12 DIAGNOSIS — Z7689 Persons encountering health services in other specified circumstances: Secondary | ICD-10-CM | POA: Diagnosis not present

## 2024-06-12 DIAGNOSIS — M6281 Muscle weakness (generalized): Secondary | ICD-10-CM | POA: Diagnosis not present

## 2024-06-13 ENCOUNTER — Other Ambulatory Visit: Payer: Self-pay | Admitting: Internal Medicine

## 2024-06-13 DIAGNOSIS — Z1231 Encounter for screening mammogram for malignant neoplasm of breast: Secondary | ICD-10-CM

## 2024-06-17 DIAGNOSIS — M6281 Muscle weakness (generalized): Secondary | ICD-10-CM | POA: Diagnosis not present

## 2024-06-17 DIAGNOSIS — M545 Low back pain, unspecified: Secondary | ICD-10-CM | POA: Diagnosis not present

## 2024-06-20 ENCOUNTER — Encounter: Payer: Self-pay | Admitting: Internal Medicine

## 2024-06-20 ENCOUNTER — Ambulatory Visit: Admitting: Internal Medicine

## 2024-06-20 VITALS — BP 108/70 | HR 69 | Ht 69.0 in | Wt 264.0 lb

## 2024-06-20 DIAGNOSIS — R197 Diarrhea, unspecified: Secondary | ICD-10-CM | POA: Insufficient documentation

## 2024-06-20 DIAGNOSIS — K219 Gastro-esophageal reflux disease without esophagitis: Secondary | ICD-10-CM

## 2024-06-20 DIAGNOSIS — E66812 Obesity, class 2: Secondary | ICD-10-CM

## 2024-06-20 DIAGNOSIS — E0800 Diabetes mellitus due to underlying condition with hyperosmolarity without nonketotic hyperglycemic-hyperosmolar coma (NKHHC): Secondary | ICD-10-CM | POA: Diagnosis not present

## 2024-06-20 DIAGNOSIS — E782 Mixed hyperlipidemia: Secondary | ICD-10-CM | POA: Diagnosis not present

## 2024-06-20 DIAGNOSIS — Z23 Encounter for immunization: Secondary | ICD-10-CM | POA: Diagnosis not present

## 2024-06-20 DIAGNOSIS — R5383 Other fatigue: Secondary | ICD-10-CM

## 2024-06-20 DIAGNOSIS — E1169 Type 2 diabetes mellitus with other specified complication: Secondary | ICD-10-CM

## 2024-06-20 DIAGNOSIS — I5021 Acute systolic (congestive) heart failure: Secondary | ICD-10-CM

## 2024-06-20 DIAGNOSIS — E1159 Type 2 diabetes mellitus with other circulatory complications: Secondary | ICD-10-CM | POA: Diagnosis not present

## 2024-06-20 DIAGNOSIS — M25471 Effusion, right ankle: Secondary | ICD-10-CM

## 2024-06-20 DIAGNOSIS — Z1231 Encounter for screening mammogram for malignant neoplasm of breast: Secondary | ICD-10-CM

## 2024-06-20 DIAGNOSIS — Z6838 Body mass index (BMI) 38.0-38.9, adult: Secondary | ICD-10-CM

## 2024-06-20 DIAGNOSIS — I152 Hypertension secondary to endocrine disorders: Secondary | ICD-10-CM

## 2024-06-20 MED ORDER — LOPERAMIDE HCL 2 MG PO CAPS: 2.0000 mg | ORAL_CAPSULE | ORAL | 0 refills | Status: DC | PRN

## 2024-06-20 MED ORDER — BLOOD GLUCOSE MONITORING SUPPL KIT: 1.0000 | PACK | Freq: Once | 0 refills | Status: AC

## 2024-06-20 NOTE — Progress Notes (Signed)
 Established Patient Office Visit  Subjective:  Patient ID: Diana Green, female    DOB: [birth year]  Age: 73 y.o. MRN: [id number]  Chief Complaint  Patient presents with   Follow-up    6 week follow up    Patient is here today for follow up. She reports doing fairly well since her last appointment but states she went to get a second opinion by [facility]. They wanted her to start Jardiance . Patient was previously encouraged to start Jardiance  03/2024 but patient reports it is too expensive and she is unable to afford medication. Patient also reports never starting Entresto as she was not aware that medication had been ordered for her to start taking. Recommended patient determine which Cardiologist she would like to continue care with and discuss her medications with them as she is unsure of what she should be taking.   Patient has complaints of daytime sleepiness that has been getting worse the last week. She endorses snoring for a long time. She denies being dx with sleep apnea in the past. Will order sleep study to be completed.  Patient also reports loose stools and incontinence approximately once a day. Reports the symptoms began since starting lasix  and spironolactone a few weeks ago. Per Cardiology note he wanted her to stop spironolactone on 06/05/24 office visit. Denies bright red blood or mucous. Reports stool is dark almost a black color. Has hx of internal hemorrhoids and diverticulosis. Colonoscopy 2024. At this time will have patient try taking Imodium as needed for loose stools. Reinforced strict diet control.    Patient needs glucose monitor kit to begin checking her blood sugars daily. Will order. Patient reports working with nutritionist is helping her make better choices with her diet. Patient is also due for routine blood work today. Her mammogram is scheduled for early December.  Patient would like a flu shot today.      No other concerns at this time.   Past  Medical History:  Diagnosis Date   Arthritis    Depression    Essential hypertension, benign 10/09/2022   GERD (gastroesophageal reflux disease)    occ no meds   High cholesterol    Hypertension    Impaired glucose tolerance 10/09/2022   Iron deficiency anemia 10/09/2022   Vitamin D  deficiency 10/09/2022    Past Surgical History:  Procedure Laterality Date   ANTERIOR CERVICAL DECOMP/DISCECTOMY FUSION N/A 07/08/2021   Procedure: C3-4 ANTERIOR CERVICAL DECOMPRESSION/DISCECTOMY FUSION 1 LEVEL WITH HEDRON;  Surgeon: Clois Fret, MD;  Location: [facility] ORS;  Service: Neurosurgery;  Laterality: N/A;   APPLICATION OF INTRAOPERATIVE CT SCAN N/A 09/19/2021   Procedure: APPLICATION OF INTRAOPERATIVE CT SCAN;  Surgeon: Clois Fret, MD;  Location: [facility] ORS;  Service: Neurosurgery;  Laterality: N/A;   APPLICATION OF WOUND VAC  09/19/2021   Procedure: APPLICATION OF WOUND VAC;  Surgeon: Clois Fret, MD;  Location: [facility] OR[facility];  Service: Neurosurgery;;  Prevena   BACK SURGERY     thoracic   CESAREAN SECTION  1971   CHOLECYSTECTOMY     COLONOSCOPY     ROTATOR CUFF REPAIR Right 2006   TOTAL KNEE ARTHROPLASTY Left 2014   TOTAL KNEE ARTHROPLASTY Right 09/11/2022   Procedure: TOTAL KNEE ARTHROPLASTY;  Surgeon: Lorelle Hussar, MD;  Location: [facility] ORS;  Service: Orthopedics;  Laterality: Right;   TUBAL LIGATION  1975    Social History   Socioeconomic History   Marital status: Married    Spouse name: Not on file  Number of children: Not on file   Years of education: Not on file   Highest education level: Not on file  Occupational History   Not on file  Tobacco Use   Smoking status: Never   Smokeless tobacco: Never  Vaping Use   Vaping status: Never Used  Substance and Sexual Activity   Alcohol use: Not Currently    Comment: rarely   Drug use: No   Sexual activity: Not Currently    Birth control/protection: Post-menopausal  Other Topics Concern   Not on file  Social  History Narrative   Not on file   Social Drivers of Health   Financial Resource Strain: High Risk (06/12/2024)   Received from [facility]   Overall Financial Resource Strain (CARDIA)    Difficulty of Paying Living Expenses: Hard  Food Insecurity: Food Insecurity Present (06/12/2024)   Received from [facility]   Hunger Vital Sign    Within the past 12 months, you worried that your food would run out before you got the money to buy more.: Sometimes true    Within the past 12 months, the food you bought just didn't last and you didn't have money to get more.: Often true  Transportation Needs: No Transportation Needs (06/12/2024)   Received from [facility] - Transportation    In the past 12 months, has lack of transportation kept you from medical appointments or from getting medications?: No    Lack of Transportation (Non-Medical): No  Physical Activity: Not on file  Stress: Not on file  Social Connections: Not on file  Intimate Partner Violence: Not At Risk (09/11/2022)   Humiliation, Afraid, Rape, and Kick questionnaire    Fear of Current or Ex-Partner: No    Emotionally Abused: No    Physically Abused: No    Sexually Abused: No    No family history on file.  No Known Allergies  Outpatient Medications Prior to Visit  Medication Sig   acetaminophen  (TYLENOL ) 500 MG tablet Take 1,000 mg by mouth 3 (three) times daily.   carvedilol (COREG) 25 MG tablet Take 1 tablet (25 mg total) by mouth 2 (two) times daily.   DULoxetine  (CYMBALTA ) 60 MG capsule TAKE 1 CAPSULE BY MOUTH IN THE  MORNING   furosemide  (LASIX ) 40 MG tablet Take 1 tablet (40 mg total) by mouth daily.   gabapentin  (NEURONTIN ) 600 MG tablet TAKE 1 TABLET BY MOUTH TWICE  DAILY   glimepiride  (AMARYL ) 2 MG tablet Take 1 tablet (2 mg total) by mouth daily with breakfast.   Lidocaine  4 % PTCH Place 1 patch onto the skin daily as needed (back pain).   losartan   (COZAAR ) 100 MG tablet Take 1 tablet (100 mg total) by mouth daily.   meclizine  (ANTIVERT ) 25 MG tablet Take 1 tablet (25 mg total) by mouth 3 (three) times daily as needed for dizziness.   rosuvastatin  (CRESTOR ) 20 MG tablet Take 1 tablet (20 mg total) by mouth daily.   sacubitril-valsartan (ENTRESTO) 24-26 MG Take 1 tablet by mouth 2 (two) times daily.   Semaglutide  (RYBELSUS ) 3 MG TABS Take 1 tablet (3 mg total) by mouth daily.   spironolactone (ALDACTONE) 25 MG tablet Take 1 tablet (25 mg total) by mouth daily.   No facility-administered medications prior to visit.    Review of Systems  Constitutional:  Positive for malaise/fatigue. Negative for chills and fever.  HENT: Negative.  Negative for congestion and sore throat.  Eyes: Negative.  Negative for blurred vision and pain.  Respiratory: Negative.  Negative for cough and shortness of breath.   Cardiovascular:  Positive for chest pain (chronic, no change from her baseline). Negative for palpitations and leg swelling.  Gastrointestinal:  Positive for diarrhea. Negative for abdominal pain, blood in stool, constipation, heartburn, melena, nausea and vomiting.  Genitourinary: Negative.  Negative for dysuria, flank pain, frequency and urgency.  Musculoskeletal: Negative.  Negative for joint pain and myalgias.  Skin: Negative.   Neurological: Negative.  Negative for dizziness, tingling, sensory change, weakness and headaches.  Endo/Heme/Allergies: Negative.   Psychiatric/Behavioral: Negative.  Negative for depression and suicidal ideas. The patient is not nervous/anxious.        Objective:   BP 108/70   Pulse 69   Ht 5' 9 (1.753 m)   Wt 264 lb (119.7 kg)   SpO2 93%   BMI 38.99 kg/m   Vitals:   06/20/24 1107  BP: 108/70  Pulse: 69  Height: 5' 9 (1.753 m)  Weight: 264 lb (119.7 kg)  SpO2: 93%  BMI (Calculated): 38.97    Physical Exam Vitals and nursing note reviewed.  Constitutional:      Appearance: Normal  appearance.  HENT:     Head: Normocephalic and atraumatic.     Nose: Nose normal.     Mouth/Throat:     Mouth: Mucous membranes are moist.     Pharynx: Oropharynx is clear.  Eyes:     Conjunctiva/sclera: Conjunctivae normal.     Pupils: Pupils are equal, round, and reactive to light.  Cardiovascular:     Rate and Rhythm: Normal rate and regular rhythm.     Pulses: Normal pulses.     Heart sounds: Normal heart sounds. No murmur heard.    Comments: improving Pulmonary:     Effort: Pulmonary effort is normal.     Breath sounds: Normal breath sounds. No wheezing.  Abdominal:     General: Bowel sounds are normal.     Palpations: Abdomen is soft.     Tenderness: There is no abdominal tenderness. There is no right CVA tenderness or left CVA tenderness.  Musculoskeletal:        General: Normal range of motion.     Cervical back: Normal range of motion.     Right lower leg: 1+ Pitting Edema present.     Left lower leg: 1+ Pitting Edema present.  Skin:    General: Skin is warm and dry.  Neurological:     General: No focal deficit present.     Mental Status: She is alert and oriented to person, place, and time.  Psychiatric:        Mood and Affect: Mood normal.        Behavior: Behavior normal.      No results found for any visits on 06/20/24.  Recent Results (from the past 2160 hours)  Basic metabolic panel with GFR     Status: Abnormal   Collection Time: 04/14/24  1:04 PM  Result Value Ref Range   Glucose 99 70 - 99 mg/dL   BUN 12 8 - 27 mg/dL   Creatinine, Ser 8.81 (H) 0.57 - 1.00 mg/dL   eGFR 49 (L) >40 fO/fpw/8.26   BUN/Creatinine Ratio 10 (L) 12 - 28   Sodium 141 134 - 144 mmol/L   Potassium 4.4 3.5 - 5.2 mmol/L   Chloride 101 96 - 106 mmol/L   CO2 25 20 - 29 mmol/L   Calcium  10.2  8.7 - 10.3 mg/dL      Assessment & Plan:  Start imodium as needed. Recommend FU with Cardiology for medication management. Sleep Study ordered. Check routine blood work today and FU  with patient on results. Glucose kit ordered. Flu shot given. Problem List Items Addressed This Visit     Gastroesophageal reflux disease without esophagitis   Relevant Medications   loperamide (IMODIUM A-D) 2 MG capsule   Other Relevant Orders   CBC with Diff   Ankle edema, bilateral   Relevant Orders   CBC with Diff   Diabetes mellitus due to underlying condition with hyperosmolarity without coma, without long-term current use of insulin (HCC)   Relevant Medications   Blood Glucose Monitoring Suppl KIT   Other Relevant Orders   Hemoglobin A1c   Combined hyperlipidemia associated with type 2 diabetes mellitus (HCC)   Relevant Orders   Lipid Panel w/o Chol/HDL Ratio   Hypertension associated with diabetes (HCC) - Primary   Relevant Orders   CMP14+EGFR   CBC with Diff   Morbid obesity with BMI of 40.0-44.9, adult (HCC)   Breast cancer screening by mammogram   Fatigue   Relevant Orders   Ambulatory referral to Sleep Studies   Diarrhea   Relevant Medications   loperamide (IMODIUM A-D) 2 MG capsule   Needs flu shot   Relevant Orders   Flu Vaccine Trivalent High Dose (Fluad) (Completed)   CHF (congestive heart failure), NYHA class I, acute, systolic (HCC)    Return in about 3 months (around 09/20/2024).   Total time spent: 25 minutes. This time includes review of previous notes and results and patient face to face interaction during today's visit.    FERNAND FREDY RAMAN, MD  06/20/2024   This document may have been prepared by [facility] Voice Recognition software and as such may include unintentional dictation errors.

## 2024-06-21 LAB — CBC WITH DIFFERENTIAL/PLATELET
Basophils Absolute: 0 x10E3/uL (ref 0.0–0.2)
Basos: 1 %
EOS (ABSOLUTE): 0.2 x10E3/uL (ref 0.0–0.4)
Eos: 4 %
Hematocrit: 36.5 % (ref 34.0–46.6)
Hemoglobin: 11.6 g/dL (ref 11.1–15.9)
Immature Grans (Abs): 0 x10E3/uL (ref 0.0–0.1)
Immature Granulocytes: 0 %
Lymphocytes Absolute: 1.5 x10E3/uL (ref 0.7–3.1)
Lymphs: 28 %
MCH: 28.9 pg (ref 26.6–33.0)
MCHC: 31.8 g/dL (ref 31.5–35.7)
MCV: 91 fL (ref 79–97)
Monocytes Absolute: 0.5 x10E3/uL (ref 0.1–0.9)
Monocytes: 9 %
Neutrophils Absolute: 3 x10E3/uL (ref 1.4–7.0)
Neutrophils: 58 %
Platelets: 241 x10E3/uL (ref 150–450)
RBC: 4.01 x10E6/uL (ref 3.77–5.28)
RDW: 12.9 % (ref 11.7–15.4)
WBC: 5.2 x10E3/uL (ref 3.4–10.8)

## 2024-06-21 LAB — HEMOGLOBIN A1C
Est. average glucose Bld gHb Est-mCnc: 128 mg/dL
Hgb A1c MFr Bld: 6.1 % — ABNORMAL HIGH (ref 4.8–5.6)

## 2024-06-21 LAB — LIPID PANEL W/O CHOL/HDL RATIO
Cholesterol, Total: 147 mg/dL (ref 100–199)
HDL: 55 mg/dL (ref >39)
LDL Chol Calc (NIH): 72 mg/dL (ref 0–99)
Triglycerides: 109 mg/dL (ref 0–149)
VLDL Cholesterol Cal: 20 mg/dL (ref 5–40)

## 2024-06-21 LAB — CMP14+EGFR
ALT: 31 IU/L (ref 0–32)
AST: 29 IU/L (ref 0–40)
Albumin: 4.4 g/dL (ref 3.8–4.8)
Alkaline Phosphatase: 69 IU/L (ref 49–135)
BUN/Creatinine Ratio: 20 (ref 12–28)
BUN: 28 mg/dL — ABNORMAL HIGH (ref 8–27)
Bilirubin Total: 0.3 mg/dL (ref 0.0–1.2)
CO2: 25 mmol/L (ref 20–29)
Calcium: 11 mg/dL — ABNORMAL HIGH (ref 8.7–10.3)
Chloride: 101 mmol/L (ref 96–106)
Creatinine, Ser: 1.43 mg/dL — ABNORMAL HIGH (ref 0.57–1.00)
Globulin, Total: 3.1 g/dL (ref 1.5–4.5)
Glucose: 91 mg/dL (ref 70–99)
Potassium: 4.8 mmol/L (ref 3.5–5.2)
Sodium: 141 mmol/L (ref 134–144)
Total Protein: 7.5 g/dL (ref 6.0–8.5)
eGFR: 39 mL/min/1.73 — ABNORMAL LOW (ref >59)

## 2024-06-23 ENCOUNTER — Ambulatory Visit: Payer: Self-pay | Admitting: Internal Medicine

## 2024-06-23 DIAGNOSIS — Z6838 Body mass index (BMI) 38.0-38.9, adult: Secondary | ICD-10-CM

## 2024-06-23 DIAGNOSIS — E0800 Diabetes mellitus due to underlying condition with hyperosmolarity without nonketotic hyperglycemic-hyperosmolar coma (NKHHC): Secondary | ICD-10-CM

## 2024-06-23 DIAGNOSIS — E1159 Type 2 diabetes mellitus with other circulatory complications: Secondary | ICD-10-CM

## 2024-06-23 DIAGNOSIS — I5021 Acute systolic (congestive) heart failure: Secondary | ICD-10-CM

## 2024-06-23 DIAGNOSIS — N1832 Chronic kidney disease, stage 3b: Secondary | ICD-10-CM | POA: Insufficient documentation

## 2024-06-23 DIAGNOSIS — N950 Postmenopausal bleeding: Secondary | ICD-10-CM

## 2024-06-24 ENCOUNTER — Telehealth: Payer: Self-pay

## 2024-06-24 NOTE — Progress Notes (Addendum)
   06/24/2024  Patient ID: Diana Green, female   DOB: 1950/12/19, 73 y.o.   MRN: [id number]  Spoke with patient via telephone regarding cost concerns with Jardiance . Patient should qualify to get for free via [facility] Cares program. Prepared application, patient reports she plans to come by today. Prepared provider portion for signing. Will submit once both portions completed.  Jon VEAR Lindau, PharmD Clinical Pharmacist [phone]

## 2024-06-25 ENCOUNTER — Telehealth: Payer: Self-pay | Admitting: Dietician

## 2024-06-25 NOTE — Telephone Encounter (Signed)
 Called patent to schedule 1:1 follow up for diabetes education for Jan or Feb 2026. She does not know what she will be doing then, so does not yet want to schedule the appointment. She will call back later to schedule.  Encouraged her to call anytime if nutrition/ diabetes related questions arise.

## 2024-06-26 ENCOUNTER — Telehealth: Payer: Self-pay | Admitting: Internal Medicine

## 2024-06-26 NOTE — Telephone Encounter (Signed)
 Patient left VM stating she received a box in the mail for a urine sample. Wants to know if this is something that we ordered. We have not ordered a Cologuard or anything like this for her.

## 2024-06-27 NOTE — Progress Notes (Signed)
 Patient notified

## 2024-06-29 ENCOUNTER — Other Ambulatory Visit: Payer: Self-pay | Admitting: Internal Medicine

## 2024-06-29 DIAGNOSIS — I10 Essential (primary) hypertension: Secondary | ICD-10-CM

## 2024-06-29 DIAGNOSIS — E782 Mixed hyperlipidemia: Secondary | ICD-10-CM

## 2024-07-03 ENCOUNTER — Ambulatory Visit: Admitting: Cardiovascular Disease

## 2024-07-04 ENCOUNTER — Ambulatory Visit (INDEPENDENT_AMBULATORY_CARE_PROVIDER_SITE_OTHER): Admitting: Cardiovascular Disease

## 2024-07-04 ENCOUNTER — Encounter: Payer: Self-pay | Admitting: Cardiovascular Disease

## 2024-07-04 VITALS — BP 108/60 | HR 71 | Ht 69.0 in | Wt 267.0 lb

## 2024-07-04 DIAGNOSIS — E1159 Type 2 diabetes mellitus with other circulatory complications: Secondary | ICD-10-CM

## 2024-07-04 DIAGNOSIS — E782 Mixed hyperlipidemia: Secondary | ICD-10-CM

## 2024-07-04 DIAGNOSIS — N1832 Chronic kidney disease, stage 3b: Secondary | ICD-10-CM

## 2024-07-04 DIAGNOSIS — I43 Cardiomyopathy in diseases classified elsewhere: Secondary | ICD-10-CM

## 2024-07-04 DIAGNOSIS — R0602 Shortness of breath: Secondary | ICD-10-CM

## 2024-07-04 DIAGNOSIS — I152 Hypertension secondary to endocrine disorders: Secondary | ICD-10-CM

## 2024-07-04 DIAGNOSIS — E0800 Diabetes mellitus due to underlying condition with hyperosmolarity without nonketotic hyperglycemic-hyperosmolar coma (NKHHC): Secondary | ICD-10-CM

## 2024-07-04 DIAGNOSIS — I110 Hypertensive heart disease with heart failure: Secondary | ICD-10-CM

## 2024-07-04 DIAGNOSIS — Z01810 Encounter for preprocedural cardiovascular examination: Secondary | ICD-10-CM

## 2024-07-04 DIAGNOSIS — R9431 Abnormal electrocardiogram [ECG] [EKG]: Secondary | ICD-10-CM

## 2024-07-04 DIAGNOSIS — R609 Edema, unspecified: Secondary | ICD-10-CM

## 2024-07-04 DIAGNOSIS — E66812 Obesity, class 2: Secondary | ICD-10-CM

## 2024-07-04 DIAGNOSIS — I5021 Acute systolic (congestive) heart failure: Secondary | ICD-10-CM

## 2024-07-04 MED ORDER — SPIRONOLACTONE 25 MG PO TABS
25.0000 mg | ORAL_TABLET | Freq: Every day | ORAL | 11 refills | Status: AC
Start: 2024-07-04 — End: 2025-07-04

## 2024-07-04 NOTE — Progress Notes (Signed)
 Cardiology Office Note   Date:  07/04/2024   ID:  Diana Green, DOB June 21, 1951, MRN [id number]  PCP:  Fernand Fredy RAMAN, MD  Cardiologist:  Denyse Fernand, MD      History of Present Illness: Diana Green is a 73 y.o. female who presents for  Chief Complaint  Patient presents with   Follow-up    4 weeks follow up    Has SOB, feeling tired, but no chest pain.      Past Medical History:  Diagnosis Date   Arthritis    Depression    Essential hypertension, benign 10/09/2022   GERD (gastroesophageal reflux disease)    occ no meds   High cholesterol    Hypertension    Impaired glucose tolerance 10/09/2022   Iron deficiency anemia 10/09/2022   Vitamin D  deficiency 10/09/2022     Past Surgical History:  Procedure Laterality Date   ANTERIOR CERVICAL DECOMP/DISCECTOMY FUSION N/A 07/08/2021   Procedure: C3-4 ANTERIOR CERVICAL DECOMPRESSION/DISCECTOMY FUSION 1 LEVEL WITH HEDRON;  Surgeon: Clois Fret, MD;  Location: [facility] ORS;  Service: Neurosurgery;  Laterality: N/A;   APPLICATION OF INTRAOPERATIVE CT SCAN N/A 09/19/2021   Procedure: APPLICATION OF INTRAOPERATIVE CT SCAN;  Surgeon: Clois Fret, MD;  Location: [facility] ORS;  Service: Neurosurgery;  Laterality: N/A;   APPLICATION OF WOUND VAC  09/19/2021   Procedure: APPLICATION OF WOUND VAC;  Surgeon: Clois Fret, MD;  Location: [facility] OR[facility];  Service: Neurosurgery;;  Prevena   BACK SURGERY     thoracic   CESAREAN SECTION  1971   CHOLECYSTECTOMY     COLONOSCOPY     ROTATOR CUFF REPAIR Right 2006   TOTAL KNEE ARTHROPLASTY Left 2014   TOTAL KNEE ARTHROPLASTY Right 09/11/2022   Procedure: TOTAL KNEE ARTHROPLASTY;  Surgeon: Lorelle Hussar, MD;  Location: [facility] ORS;  Service: Orthopedics;  Laterality: Right;   TUBAL LIGATION  1975     Current Outpatient Medications  Medication Sig Dispense Refill   acetaminophen  (TYLENOL ) 500 MG tablet Take 1,000 mg by mouth 3 (three) times daily.     carvedilol (COREG) 25 MG  tablet Take 1 tablet (25 mg total) by mouth 2 (two) times daily. 60 tablet 11   DULoxetine  (CYMBALTA ) 60 MG capsule TAKE 1 CAPSULE BY MOUTH IN THE  MORNING 90 capsule 3   furosemide  (LASIX ) 40 MG tablet Take 1 tablet (40 mg total) by mouth daily. 30 tablet 11   gabapentin  (NEURONTIN ) 600 MG tablet TAKE 1 TABLET BY MOUTH TWICE  DAILY 200 tablet 2   glimepiride  (AMARYL ) 2 MG tablet Take 1 tablet (2 mg total) by mouth daily with breakfast. 90 tablet 3   Lidocaine  4 % PTCH Place 1 patch onto the skin daily as needed (back pain).     loperamide (IMODIUM A-D) 2 MG capsule Take 1 capsule (2 mg total) by mouth as needed for diarrhea or loose stools. 30 capsule 0   losartan  (COZAAR ) 100 MG tablet Take 1 tablet (100 mg total) by mouth daily. 30 tablet 11   meclizine  (ANTIVERT ) 25 MG tablet Take 1 tablet (25 mg total) by mouth 3 (three) times daily as needed for dizziness. 30 tablet 0   rosuvastatin  (CRESTOR ) 20 MG tablet TAKE 1 TABLET BY MOUTH DAILY 100 tablet 2   Semaglutide  (RYBELSUS ) 3 MG TABS Take 1 tablet (3 mg total) by mouth daily. 30 tablet 0   spironolactone (ALDACTONE) 25 MG tablet Take 1 tablet (25 mg total) by mouth daily. 30 tablet 11  No current facility-administered medications for this visit.    Allergies:   Patient has no known allergies.    Social History:   reports that she has never smoked. She has never used smokeless tobacco. She reports that she does not currently use alcohol. She reports that she does not use drugs.   Family History:  family history is not on file.    ROS:     Review of Systems  Constitutional: Negative.   HENT: Negative.    Eyes: Negative.   Respiratory: Negative.    Gastrointestinal: Negative.   Genitourinary: Negative.   Musculoskeletal: Negative.   Skin: Negative.   Neurological: Negative.   Endo/Heme/Allergies: Negative.   Psychiatric/Behavioral: Negative.    All other systems reviewed and are negative.     All other systems are reviewed  and negative.    PHYSICAL EXAM: VS:  BP 108/60   Pulse 71   Ht 5' 9 (1.753 m)   Wt 267 lb (121.1 kg)   SpO2 95%   BMI 39.43 kg/m  , BMI Body mass index is 39.43 kg/m. Last weight:  Wt Readings from Last 3 Encounters:  07/04/24 267 lb (121.1 kg)  06/20/24 264 lb (119.7 kg)  06/05/24 275 lb 9.6 oz (125 kg)     Physical Exam Constitutional:      Appearance: Normal appearance.  Cardiovascular:     Rate and Rhythm: Normal rate and regular rhythm.     Heart sounds: Normal heart sounds.  Pulmonary:     Effort: Pulmonary effort is normal.     Breath sounds: Normal breath sounds.  Musculoskeletal:     Right lower leg: No edema.     Left lower leg: No edema.  Neurological:     Mental Status: She is alert.       EKG:   Recent Labs: 06/20/2024: ALT 31; BUN 28; Creatinine, Ser 1.43; Hemoglobin 11.6; Platelets 241; Potassium 4.8; Sodium 141    Lipid Panel    Component Value Date/Time   CHOL 147 06/20/2024 1208   TRIG 109 06/20/2024 1208   HDL 55 06/20/2024 1208   CHOLHDL 3.4 01/08/2023 1141   LDLCALC 72 06/20/2024 1208      Other studies Reviewed: Additional studies/ records that were reviewed today include:  Review of the above records demonstrates:       No data to display            ASSESSMENT AND PLAN:    ICD-10-CM   1. Preoperative cardiovascular examination  Z01.810    Needs to redo knee surgery, may proceed.    2. 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 Hypertension associated with diabetes (HCC)  E11.59 spironolactone (ALDACTONE) 25 MG tablet   I15.2    BP low, stop amlodapine    4. Mixed hyperlipidemia  E78.2 spironolactone (ALDACTONE) 25 MG tablet    5. CHF (congestive heart failure), NYHA class I, acute, systolic (HCC)  I50.21 spironolactone (ALDACTONE) 25 MG tablet   ECHO LVEF was 33%. Stress test has septal reversible defect.But has  no chest pain and creat 1.43,  hold off cath for now.Entresto not filled, continue losartan .    6. CKD stage 3b, GFR 30-44 ml/min (HCC)  N18.32     7. Class 2 severe obesity due to excess calories with serious comorbidity and body mass index (BMI) of 38.0 to 38.9 in adult  E66.812  Z68.38     8. Hypertension associated with diabetes (HCC)  E11.59 spironolactone (ALDACTONE) 25 MG tablet   I15.2     9. Abnormal EKG  R94.31 spironolactone (ALDACTONE) 25 MG tablet    10. Morbid obesity with BMI of 40.0-44.9, adult (HCC)  E66.01 spironolactone (ALDACTONE) 25 MG tablet   Z68.41     11. Cardiomyopathy due to hypertension, with heart failure (HCC)  I11.0 spironolactone (ALDACTONE) 25 MG tablet   I43     12. SOB (shortness of breath)  R06.02 spironolactone (ALDACTONE) 25 MG tablet   Stress test showed septal wall revesible defect. ECHO showed Severe LV systolic dysfunction with LVEF 31%. NO CHEST PAIN. Will treat medically.    13. Edema, unspecified type  R60.9 spironolactone (ALDACTONE) 25 MG tablet    14. CHF (congestive heart failure), NYHA class I, acute, systolic (HCC)  I50.21 spironolactone (ALDACTONE) 25 MG tablet   INCREASE LASIX  TO 40 DAILY AND ALDACTONE TO 25 DAILY.       Problem List Items Addressed This Visit       Cardiovascular and Mediastinum   Hypertension associated with diabetes (HCC)   Relevant Medications   spironolactone (ALDACTONE) 25 MG tablet   CHF (congestive heart failure), NYHA class I, acute, systolic (HCC)   Relevant Medications   spironolactone (ALDACTONE) 25 MG tablet     Endocrine   Diabetes mellitus due to underlying condition with hyperosmolarity without coma, without long-term current use of insulin (HCC)     Genitourinary   CKD stage 3b, GFR 30-44 ml/min (HCC)     Other   Mixed hyperlipidemia   Relevant Medications   spironolactone (ALDACTONE) 25 MG tablet   Abnormal EKG   Relevant Medications   spironolactone (ALDACTONE) 25 MG tablet    Morbid obesity with BMI of 40.0-44.9, adult (HCC)   Relevant Medications   spironolactone (ALDACTONE) 25 MG tablet   Other Visit Diagnoses       Preoperative cardiovascular examination    -  Primary   Needs to redo knee surgery, may proceed.     Class 2 severe obesity due to excess calories with serious comorbidity and body mass index (BMI) of 38.0 to 38.9 in adult         Cardiomyopathy due to hypertension, with heart failure (HCC)       Relevant Medications   spironolactone (ALDACTONE) 25 MG tablet     SOB (shortness of breath)       Stress test showed septal wall revesible defect. ECHO showed Severe LV systolic dysfunction with LVEF 31%. NO CHEST PAIN. Will treat medically.   Relevant Medications   spironolactone (ALDACTONE) 25 MG tablet     Edema, unspecified type       Relevant Medications   spironolactone (ALDACTONE) 25 MG tablet          Disposition:   Return in about 5 weeks (around 08/08/2024).    Total time spent: 30= minutes  Signed,  Denyse Bathe, MD  07/04/2024 10:42 AM    [facility]

## 2024-07-08 ENCOUNTER — Ambulatory Visit (INDEPENDENT_AMBULATORY_CARE_PROVIDER_SITE_OTHER): Admitting: Internal Medicine

## 2024-07-08 ENCOUNTER — Encounter: Payer: Self-pay | Admitting: Internal Medicine

## 2024-07-08 ENCOUNTER — Ambulatory Visit: Payer: Self-pay | Admitting: Internal Medicine

## 2024-07-08 VITALS — BP 116/74 | HR 78 | Ht 69.0 in | Wt 266.2 lb

## 2024-07-08 DIAGNOSIS — E0800 Diabetes mellitus due to underlying condition with hyperosmolarity without nonketotic hyperglycemic-hyperosmolar coma (NKHHC): Secondary | ICD-10-CM | POA: Diagnosis not present

## 2024-07-08 DIAGNOSIS — G4733 Obstructive sleep apnea (adult) (pediatric): Secondary | ICD-10-CM

## 2024-07-08 DIAGNOSIS — E782 Mixed hyperlipidemia: Secondary | ICD-10-CM | POA: Diagnosis not present

## 2024-07-08 DIAGNOSIS — E1159 Type 2 diabetes mellitus with other circulatory complications: Secondary | ICD-10-CM

## 2024-07-08 DIAGNOSIS — I152 Hypertension secondary to endocrine disorders: Secondary | ICD-10-CM

## 2024-07-08 DIAGNOSIS — E66812 Obesity, class 2: Secondary | ICD-10-CM

## 2024-07-08 DIAGNOSIS — Z6839 Body mass index (BMI) 39.0-39.9, adult: Secondary | ICD-10-CM

## 2024-07-08 DIAGNOSIS — N1832 Chronic kidney disease, stage 3b: Secondary | ICD-10-CM | POA: Diagnosis not present

## 2024-07-08 DIAGNOSIS — E1169 Type 2 diabetes mellitus with other specified complication: Secondary | ICD-10-CM

## 2024-07-08 DIAGNOSIS — I5021 Acute systolic (congestive) heart failure: Secondary | ICD-10-CM

## 2024-07-08 LAB — POCT CBG (FASTING - GLUCOSE)-MANUAL ENTRY: Glucose Fasting, POC: 127 mg/dL — AB (ref 70–99)

## 2024-07-08 MED ORDER — RYBELSUS 3 MG PO TABS: 3.0000 mg | ORAL_TABLET | Freq: Every day | ORAL | 0 refills | Status: DC

## 2024-07-08 NOTE — Progress Notes (Signed)
Pt informed

## 2024-07-08 NOTE — Progress Notes (Signed)
 Established Patient Office Visit  Subjective:  Patient ID: Diana Green, female    DOB: [birth year]  Age: 73 y.o. MRN: [id number]  Chief Complaint  Patient presents with   Follow-up    Discuss sleep apnea    Patient is here today for follow up.  She recently competed sleep study and the recommended titration study 07/10/24. She has concerns today and wanted to discuss her initial sleep study she had completed. She has moderate to severe sleep apnea with hypoxia events. Educated patient on the need to wear her CPAP machine nightly once she completes her sleep study and that the medical supply store will give her education on how to use it and clean it properly.  Patient has been seeing a nutritionist in a group session and want to do 1:1 appointments. 06/25/24 the Nutritionist office reached out to schedule 1:1 with patient. Recommended patient to call her Nutritionist office to get an appointment scheduled.  Fasting blood glucose levels are 100-115 in the morning. Her most recent HbgA1c improved to 6.1% for 6.7% Patient is unsure if she is taking Rybelus 3 mg. Will send refill to begin this medication. She has lost 9 lbs since 05/2024 office visit. Patient would like a referral to Nephrologist.  Patient is due for left knee replacement 07/2024. Cardiology gave her the okay to get knee replacement completed.  She has already had her flu shot for the season.     No other concerns at this time.   Past Medical History:  Diagnosis Date   Arthritis    Depression    Essential hypertension, benign 10/09/2022   GERD (gastroesophageal reflux disease)    occ no meds   High cholesterol    Hypertension    Impaired glucose tolerance 10/09/2022   Iron deficiency anemia 10/09/2022   Vitamin D  deficiency 10/09/2022    Past Surgical History:  Procedure Laterality Date   ANTERIOR CERVICAL DECOMP/DISCECTOMY FUSION N/A 07/08/2021   Procedure: C3-4 ANTERIOR CERVICAL DECOMPRESSION/DISCECTOMY  FUSION 1 LEVEL WITH HEDRON;  Surgeon: Clois Fret, MD;  Location: [facility];  Service: Neurosurgery;  Laterality: N/A;   APPLICATION OF INTRAOPERATIVE CT SCAN N/A 09/19/2021   Procedure: APPLICATION OF INTRAOPERATIVE CT SCAN;  Surgeon: Clois Fret, MD;  Location: [facility];  Service: Neurosurgery;  Laterality: N/A;   APPLICATION OF WOUND VAC  09/19/2021   Procedure: APPLICATION OF WOUND VAC;  Surgeon: Clois Fret, MD;  Location: [facility];  Service: Neurosurgery;;  [facility]   BACK SURGERY     thoracic   CESAREAN SECTION  1971   CHOLECYSTECTOMY     COLONOSCOPY     ROTATOR CUFF REPAIR Right 2006   TOTAL KNEE ARTHROPLASTY Left 2014   TOTAL KNEE ARTHROPLASTY Right 09/11/2022   Procedure: TOTAL KNEE ARTHROPLASTY;  Surgeon: Lorelle Hussar, MD;  Location: [facility];  Service: Orthopedics;  Laterality: Right;   TUBAL LIGATION  1975    Social History   Socioeconomic History   Marital status: Married    Spouse name: Not on file   Number of children: Not on file   Years of education: Not on file   Highest education level: Not on file  Occupational History   Not on file  Tobacco Use   Smoking status: Never   Smokeless tobacco: Never  Vaping Use   Vaping status: Never Used  Substance and Sexual Activity   Alcohol use: Not Currently    Comment: rarely   Drug use: No   Sexual activity: Not Currently  Birth control/protection: Post-menopausal  Other Topics Concern   Not on file  Social History Narrative   Not on file   Social Drivers of Health   Financial Resource Strain: High Risk (06/12/2024)   Received from [facility]   Overall Financial Resource Strain (CARDIA)    Difficulty of Paying Living Expenses: Hard  Food Insecurity: Food Insecurity Present (06/12/2024)   Received from [facility]   Hunger Vital Sign    Within the past 12 months, you worried that your food would run out before you got the money to buy more.:  Sometimes true    Within the past 12 months, the food you bought just didn't last and you didn't have money to get more.: Often true  Transportation Needs: No Transportation Needs (06/12/2024)   Received from [facility] - Transportation    In the past 12 months, has lack of transportation kept you from medical appointments or from getting medications?: No    Lack of Transportation (Non-Medical): No  Physical Activity: Not on file  Stress: Not on file  Social Connections: Not on file  Intimate Partner Violence: Not At Risk (09/11/2022)   Humiliation, Afraid, Rape, and Kick questionnaire    Fear of Current or Ex-Partner: No    Emotionally Abused: No    Physically Abused: No    Sexually Abused: No    History reviewed. No pertinent family history.  No Known Allergies  Outpatient Medications Prior to Visit  Medication Sig   acetaminophen  (TYLENOL ) 500 MG tablet Take 1,000 mg by mouth 3 (three) times daily.   carvedilol (COREG) 25 MG tablet Take 1 tablet (25 mg total) by mouth 2 (two) times daily.   DULoxetine  (CYMBALTA ) 60 MG capsule TAKE 1 CAPSULE BY MOUTH IN THE  MORNING   empagliflozin  (JARDIANCE ) 10 MG TABS tablet Take 10 mg by mouth daily.   furosemide  (LASIX ) 40 MG tablet Take 1 tablet (40 mg total) by mouth daily.   gabapentin  (NEURONTIN ) 600 MG tablet TAKE 1 TABLET BY MOUTH TWICE  DAILY   glimepiride  (AMARYL ) 2 MG tablet Take 1 tablet (2 mg total) by mouth daily with breakfast.   Lidocaine  4 % PTCH Place 1 patch onto the skin daily as needed (back pain).   loperamide (IMODIUM A-D) 2 MG capsule Take 1 capsule (2 mg total) by mouth as needed for diarrhea or loose stools.   losartan  (COZAAR ) 100 MG tablet Take 1 tablet (100 mg total) by mouth daily.   meclizine  (ANTIVERT ) 25 MG tablet Take 1 tablet (25 mg total) by mouth 3 (three) times daily as needed for dizziness.   rosuvastatin  (CRESTOR ) 20 MG tablet TAKE 1 TABLET BY MOUTH DAILY   spironolactone  (ALDACTONE) 25 MG tablet Take 1 tablet (25 mg total) by mouth daily.   [DISCONTINUED] Semaglutide  (RYBELSUS ) 3 MG TABS Take 1 tablet (3 mg total) by mouth daily.   No facility-administered medications prior to visit.    Review of Systems  Constitutional:  Positive for malaise/fatigue. Negative for chills and fever.  HENT: Negative.  Negative for congestion and sore throat.   Eyes: Negative.  Negative for blurred vision and pain.  Respiratory: Negative.  Negative for cough and shortness of breath.   Cardiovascular: Negative.  Negative for chest pain, palpitations and leg swelling.  Gastrointestinal: Negative.  Negative for abdominal pain, blood in stool, constipation, diarrhea, heartburn, melena, nausea and vomiting.  Genitourinary: Negative.  Negative for dysuria, flank pain,  frequency and urgency.  Musculoskeletal:  Positive for joint pain (left knee pain). Negative for myalgias.  Skin: Negative.   Neurological: Negative.  Negative for dizziness, tingling, sensory change, weakness and headaches.  Endo/Heme/Allergies: Negative.   Psychiatric/Behavioral: Negative.  Negative for depression and suicidal ideas. The patient is not nervous/anxious.        Objective:   BP 116/74   Pulse 78   Ht 5' 9 (1.753 m)   Wt 266 lb 3.2 oz (120.7 kg)   SpO2 99%   BMI 39.31 kg/m   Vitals:   07/08/24 1116  BP: 116/74  Pulse: 78  Height: 5' 9 (1.753 m)  Weight: 266 lb 3.2 oz (120.7 kg)  SpO2: 99%  BMI (Calculated): 39.29    Physical Exam Vitals and nursing note reviewed.  Constitutional:      Appearance: Normal appearance.  HENT:     Head: Normocephalic and atraumatic.     Nose: Nose normal.     Mouth/Throat:     Mouth: Mucous membranes are moist.     Pharynx: Oropharynx is clear.  Eyes:     Conjunctiva/sclera: Conjunctivae normal.     Pupils: Pupils are equal, round, and reactive to light.  Cardiovascular:     Rate and Rhythm: Normal rate and regular rhythm.     Pulses: Normal  pulses.     Heart sounds: Normal heart sounds. No murmur heard. Pulmonary:     Effort: Pulmonary effort is normal.     Breath sounds: Normal breath sounds. No wheezing.  Abdominal:     General: Bowel sounds are normal.     Palpations: Abdomen is soft.     Tenderness: There is no abdominal tenderness. There is no right CVA tenderness or left CVA tenderness.  Musculoskeletal:        General: Normal range of motion.     Cervical back: Normal range of motion.     Right lower leg: No edema.     Left lower leg: No edema.  Skin:    General: Skin is warm and dry.  Neurological:     General: No focal deficit present.     Mental Status: She is alert and oriented to person, place, and time.  Psychiatric:        Mood and Affect: Mood normal.        Behavior: Behavior normal.      Results for orders placed or performed in visit on 07/08/24  POCT CBG (Fasting - Glucose)  Result Value Ref Range   Glucose Fasting, POC 127 (A) 70 - 99 mg/dL    Recent Results (from the past 2160 hours)  Basic metabolic panel with GFR     Status: Abnormal   Collection Time: 04/14/24  1:04 PM  Result Value Ref Range   Glucose 99 70 - 99 mg/dL   BUN 12 8 - 27 mg/dL   Creatinine, Ser 8.81 (H) 0.57 - 1.00 mg/dL   eGFR 49 (L) >40 fO/fpw/8.26   BUN/Creatinine Ratio 10 (L) 12 - 28   Sodium 141 134 - 144 mmol/L   Potassium 4.4 3.5 - 5.2 mmol/L   Chloride 101 96 - 106 mmol/L   CO2 25 20 - 29 mmol/L   Calcium  10.2 8.7 - 10.3 mg/dL  [id number]     Status: Abnormal   Collection Time: 06/20/24 12:08 PM  Result Value Ref Range   Glucose 91 70 - 99 mg/dL   BUN 28 (H) 8 - 27 mg/dL   Creatinine,  Ser 1.43 (H) 0.57 - 1.00 mg/dL   eGFR 39 (L) >40 fO/fpw/8.26   BUN/Creatinine Ratio 20 12 - 28   Sodium 141 134 - 144 mmol/L   Potassium 4.8 3.5 - 5.2 mmol/L   Chloride 101 96 - 106 mmol/L   CO2 25 20 - 29 mmol/L   Calcium  11.0 (H) 8.7 - 10.3 mg/dL   Total Protein 7.5 6.0 - 8.5 g/dL   Albumin 4.4 3.8 - 4.8 g/dL    Globulin, Total 3.1 1.5 - 4.5 g/dL   Bilirubin Total 0.3 0.0 - 1.2 mg/dL   Alkaline Phosphatase 69 49 - 135 IU/L   AST 29 0 - 40 IU/L   ALT 31 0 - 32 IU/L  Lipid Panel w/o Chol/HDL Ratio     Status: None   Collection Time: 06/20/24 12:08 PM  Result Value Ref Range   Cholesterol, Total 147 100 - 199 mg/dL   Triglycerides 890 0 - 149 mg/dL   HDL 55 >60 mg/dL   VLDL Cholesterol Cal 20 5 - 40 mg/dL   LDL Chol Calc (NIH) 72 0 - 99 mg/dL  CBC with Diff     Status: None   Collection Time: 06/20/24 12:08 PM  Result Value Ref Range   WBC 5.2 3.4 - 10.8 x10E3/uL   RBC 4.01 3.77 - 5.28 x10E6/uL   Hemoglobin 11.6 11.1 - 15.9 g/dL   Hematocrit 63.4 65.9 - 46.6 %   MCV 91 79 - 97 fL   MCH 28.9 26.6 - 33.0 pg   MCHC 31.8 31.5 - 35.7 g/dL   RDW 87.0 88.2 - 84.5 %   Platelets 241 150 - 450 x10E3/uL   Neutrophils 58 Not Estab. %   Lymphs 28 Not Estab. %   Monocytes 9 Not Estab. %   Eos 4 Not Estab. %   Basos 1 Not Estab. %   Neutrophils Absolute 3.0 1.4 - 7.0 x10E3/uL   Lymphocytes Absolute 1.5 0.7 - 3.1 x10E3/uL   Monocytes Absolute 0.5 0.1 - 0.9 x10E3/uL   EOS (ABSOLUTE) 0.2 0.0 - 0.4 x10E3/uL   Basophils Absolute 0.0 0.0 - 0.2 x10E3/uL   Immature Granulocytes 0 Not Estab. %   Immature Grans (Abs) 0.0 0.0 - 0.1 x10E3/uL  Hemoglobin A1c     Status: Abnormal   Collection Time: 06/20/24 12:08 PM  Result Value Ref Range   Hgb A1c MFr Bld 6.1 (H) 4.8 - 5.6 %    Comment:          Prediabetes: 5.7 - 6.4          Diabetes: >6.4          Glycemic control for adults with diabetes: <7.0    Est. average glucose Bld gHb Est-mCnc 128 mg/dL  POCT CBG (Fasting - Glucose)     Status: Abnormal   Collection Time: 07/08/24 11:25 AM  Result Value Ref Range   Glucose Fasting, POC 127 (A) 70 - 99 mg/dL      Assessment & Plan:  Keep specialist appointments as scheduled. Restart Rybelus 3 mg daily. Continue other medications as prescribed. Nephrology referral sent. Recommend patient make FU Nutritionist  appointment. Reinforced healthy diet and exercise as tolerated. 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, without long-term current use of insulin (HCC)   Relevant Medications   empagliflozin  (JARDIANCE ) 10 MG TABS tablet   Semaglutide  (RYBELSUS ) 3 MG TABS   Other Relevant Orders  POCT CBG (Fasting - Glucose) (Completed)   Ambulatory referral to Nephrology   Combined hyperlipidemia associated with type 2 diabetes mellitus (HCC)   Relevant Medications   empagliflozin  (JARDIANCE ) 10 MG TABS tablet   Semaglutide  (RYBELSUS ) 3 MG TABS   Hypertension associated with diabetes (HCC)   Relevant Medications   empagliflozin  (JARDIANCE ) 10 MG TABS tablet   Semaglutide  (RYBELSUS ) 3 MG TABS   Other Relevant Orders   Ambulatory referral to Nephrology   Morbid obesity with BMI of 40.0-44.9, adult (HCC)   Relevant Medications   empagliflozin  (JARDIANCE ) 10 MG TABS tablet   Semaglutide  (RYBELSUS ) 3 MG TABS   CHF (congestive heart failure), NYHA class I, acute, systolic (HCC)   CKD stage 3b, GFR 30-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   Total time spent: 25 minutes. This time includes review of previous notes and results and patient face to face interaction during today's visit.    FERNAND FREDY RAMAN, MD  07/08/2024   This document may have been prepared by [facility] Voice Recognition software and as such may include unintentional dictation errors.

## 2024-07-09 ENCOUNTER — Telehealth: Payer: Self-pay

## 2024-07-09 DIAGNOSIS — E0800 Diabetes mellitus due to underlying condition with hyperosmolarity without nonketotic hyperglycemic-hyperosmolar coma (NKHHC): Secondary | ICD-10-CM

## 2024-07-09 DIAGNOSIS — E66812 Obesity, class 2: Secondary | ICD-10-CM

## 2024-07-09 DIAGNOSIS — G4733 Obstructive sleep apnea (adult) (pediatric): Secondary | ICD-10-CM

## 2024-07-09 NOTE — Telephone Encounter (Signed)
 Pt states her Semaglutide  she was prescribed yesterday is $233 and pt states she cannot afford this. Pt is requesting call back with alternative.

## 2024-07-10 DIAGNOSIS — G4733 Obstructive sleep apnea (adult) (pediatric): Secondary | ICD-10-CM | POA: Insufficient documentation

## 2024-07-10 MED ORDER — MOUNJARO 2.5 MG/0.5ML ~~LOC~~ SOAJ: 2.5000 mg | SUBCUTANEOUS | 1 refills | Status: DC

## 2024-07-10 NOTE — Addendum Note (Signed)
 Addended by: FERNAND FREDY RAMAN on: 07/10/2024 10:38 AM   Modules accepted: Orders

## 2024-07-21 ENCOUNTER — Other Ambulatory Visit: Payer: Self-pay | Admitting: Internal Medicine

## 2024-07-22 ENCOUNTER — Other Ambulatory Visit: Payer: Self-pay | Admitting: Orthopedic Surgery

## 2024-07-23 ENCOUNTER — Ambulatory Visit: Payer: Self-pay | Admitting: Internal Medicine

## 2024-07-23 ENCOUNTER — Encounter: Payer: Self-pay | Admitting: Internal Medicine

## 2024-07-29 ENCOUNTER — Telehealth: Payer: Self-pay

## 2024-07-29 NOTE — Progress Notes (Signed)
   07/29/2024  Patient ID: Diana Green, female   DOB: Sep 22, 1950, 73 y.o.   MRN: [id number]  Received notification from [facility] that patient has been APPROVED to receive Jardiance  through Dec 31.2026.  Jon VEAR Lindau, PharmD Clinical Pharmacist [phone]

## 2024-07-31 NOTE — Progress Notes (Deleted)
° ° °  GYNECOLOGY PROGRESS NOTE  Subjective:  PCP: Fernand Fredy RAMAN, MD  Patient ID: Diana Green, female    DOB: March 29, 1951, 73 y.o.   MRN: [id number]  HPI  Patient is a 73 y.o. G2P2 female who was referred by Jannetta Sprinkles MD for postmenopausal bleeding in November.  Patient say she noticed dark brown vaginal discharge.    {Common ambulatory SmartLinks:19316}  Review of Systems {ros; complete:30496}   Objective:   There were no vitals taken for this visit. There is no height or weight on file to calculate BMI.  General appearance: {general exam:16600} Abdomen: {abdominal exam:16834} Pelvic: {pelvic exam:16852::cervix normal in appearance,external genitalia normal,no adnexal masses or tenderness,no cervical motion tenderness,rectovaginal septum normal,uterus normal size, shape, and consistency,vagina normal without discharge} Extremities: {extremity exam:5109} Neurologic: {neuro exam:17854}   Assessment/Plan:   No diagnosis found.   There are no diagnoses linked to this encounter.     Estil Mangle, DO [facility]

## 2024-08-04 ENCOUNTER — Encounter

## 2024-08-05 ENCOUNTER — Ambulatory Visit: Admitting: Obstetrics

## 2024-08-06 ENCOUNTER — Inpatient Hospital Stay: Admission: RE | Admit: 2024-08-06 | Discharge: 2024-08-06 | Attending: Orthopedic Surgery

## 2024-08-06 ENCOUNTER — Other Ambulatory Visit: Payer: Self-pay | Admitting: Nephrology

## 2024-08-06 ENCOUNTER — Other Ambulatory Visit: Payer: Self-pay

## 2024-08-06 DIAGNOSIS — E0800 Diabetes mellitus due to underlying condition with hyperosmolarity without nonketotic hyperglycemic-hyperosmolar coma (NKHHC): Secondary | ICD-10-CM

## 2024-08-06 DIAGNOSIS — M25562 Pain in left knee: Secondary | ICD-10-CM

## 2024-08-06 DIAGNOSIS — Z01812 Encounter for preprocedural laboratory examination: Secondary | ICD-10-CM

## 2024-08-06 DIAGNOSIS — R7302 Impaired glucose tolerance (oral): Secondary | ICD-10-CM

## 2024-08-06 DIAGNOSIS — D631 Anemia in chronic kidney disease: Secondary | ICD-10-CM

## 2024-08-06 DIAGNOSIS — N281 Cyst of kidney, acquired: Secondary | ICD-10-CM

## 2024-08-06 DIAGNOSIS — E1122 Type 2 diabetes mellitus with diabetic chronic kidney disease: Secondary | ICD-10-CM

## 2024-08-06 NOTE — Progress Notes (Signed)
 [facility] New Consult Visit  Patient Name: Diana Green, female   Patient DOB: 01-21-1951 Date of Service: 08/06/2024  Patient MRN: [id number] Provider Creating Note: Pinkey Edman, MD  [phone] Primary Care Physician: Fernand Fredy RAMAN, MD  [address] [place] [id number] Additional Physicians/ Providers:    History of Present Illness Diana Green is a 73 y.o. female with past medical history of obesity, obstructive sleep apnea, diabetes, hypertension, congestive heart failure, hyperlipidemia and chronic kidney disease with anemia now referred for renal evaluation.  She had a history of nonsteroidal anti-inflammatory drug use but has stopped at this time. She has been on gabapentin  for severe back pains and osteoarthritis. She has obstructive sleep apnea but has not started on CPAP machine yet. She was recently prescribed Mounjaro  for weight loss.  She has not started yet. She walks with the help of a walker. She had a CT scan done for abdominal pain last year which showed multiple benign renal cysts. Denies any chest pain, SOB or DOE.  Denies any history of fever, chills or head aches. Denies any history of kidney stone disease or UTI.  Denies an history of urinary symptoms of U/F or Dysuria.  Denies any family history of renal disease.  IV Contrast exposure recently: Denies Smoking history: Denies Alcohol history: Denies   Medications   Current Outpatient Medications:    carvedilol  (COREG ) 25 MG tablet, Take 25 mg by mouth, Disp: , Rfl:    cyanocobalamin (VITAMIN B-12) 1000 MCG tablet, Take 1,000 mcg by mouth 1 (one) time each day, Disp: , Rfl:    DULoxetine  (CYMBALTA ) 60 MG DR capsule, Take 60 mg by mouth every morning, Disp: , Rfl:    Empagliflozin  10 MG tablet, Take 10 mg by mouth 1 (one) time each day, Disp: , Rfl:    furosemide  (LASIX ) 40 MG tablet, Take 40 mg by mouth in the morning., Disp: , Rfl:    gabapentin  (NEURONTIN ) 600 MG  tablet, Take 600 mg by mouth in the morning and 600 mg in the evening., Disp: , Rfl:    glimepiride  (AMARYL ) 2 MG tablet, Take 2 mg by mouth, Disp: , Rfl:    loperamide  (IMODIUM ) 2 MG capsule, Take 2 mg by mouth, Disp: , Rfl:    losartan  (COZAAR ) 100 MG tablet, Take 100 mg by mouth 1 (one) time each day, Disp: , Rfl:    rosuvastatin  (CRESTOR ) 20 MG tablet, Take 20 mg by mouth 1 (one) time each day, Disp: , Rfl:    spironolactone  (ALDACTONE ) 25 MG tablet, Take 25 mg by mouth 1 (one) time each day, Disp: , Rfl:    Acetaminophen  500 MG capsule, Take 500-1,000 mg by mouth every 6 hours, Disp: , Rfl:    Tirzepatide  (Mounjaro ) 2.5 MG/0.5ML solution auto-injector, Inject 2.5 mg under the skin (Patient not taking: Reported on 08/06/2024), Disp: , Rfl:    Allergies Patient has no known allergies.  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    Eyes: Negative.   Respiratory: Negative.  Cardiovascular:  Positive for leg swelling.  Gastrointestinal: Negative.   Genitourinary: Negative.   Musculoskeletal:  Positive for back pain and joint pain.  Skin: Negative.   Neurological:  Positive for weakness.     History Past Medical History:  Diagnosis Date   Anemia    Diabetes mellitus without mention of complication, type II or unspecified type, not stated as uncontrolled (HCC)    Esophageal reflux    Essential hypertension     History reviewed. No pertinent surgical history. Family History  Problem Relation Age of Onset   Heart disease  Mother    Hypertension Mother    Cancer Sister    Cancer Brother    Social History   Tobacco Use   Smoking status: Never   Smokeless tobacco: Never  Substance Use Topics   Alcohol use: Not Currently        Physical Exam  Vitals BP 122/80 (BP Location: Right forearm, Patient Position: Sitting)   Pulse 64   Temp 98 F   Ht 5' 9 (1.753 m)   Wt 264 lb (120 kg)   SpO2 93%   BMI 38.99 kg/m   PHYSICAL EXAM: General appearance: well developed, well nourished, NAD Eyes: anicteric sclerae, moist conjunctivae; no lid-lag  Neck: Trachea midline; supple Lungs: CTAB, with normal respiratory effort  CV: RRR, no MRGs  Abdomen: Soft, non-tender; bowel sounds present Extremities: No peripheral edema, cyanosis, or clubbing. Skin: Warm and dry, normal skin turgor, no rashes noted.   Laboratory Studies    CBC with Diff Component 06/20/24 <redacted file path> 03/03/24 <redacted file path> 01/28/24 <redacted file path> 12/03/23 <redacted file path> 07/29/23 <redacted file path> 07/12/23 <redacted file path>  WBC 5.2 6.1 <redacted file path> 4.8 <redacted file path> 6.6 <redacted file path> 6.1 <redacted file path> 5.7 <redacted file path>   RBC 4.01 3.78 <redacted file path> 3.54 <redacted file path> Low  3.87 <redacted file path> 3.70 <redacted file path> Low  3.87 <redacted file path>  Hemoglobin 11.6 11.2 <redacted file path> 10.4 <redacted file path> Low  11.3 <redacted file path> 11.2 <redacted file path> Low  11.5 <redacted file path>  Hematocrit 36.5 35.3 <redacted file path> -- 35.6 <redacted file path> -- 35.9 <redacted file path>  MCV 91 93 <redacted file path> 90.7 <redacted file path> 92 <redacted file path> 93.0 <redacted file path> 93 <redacted file path>  MCH 28.9 29.6 <redacted file path> 29.4 <redacted file path> 29.2 <redacted file path> 30.3 <redacted file path> 29.7 <redacted file path>  MCHC 31.8 31.7 <redacted file path> 32.4 <redacted file path> 31.7 <redacted  file path> 32.6 <redacted file path> 32.0 <redacted file path>  RDW 12.9 13.0 <redacted file path> 12.5 <redacted file path> 12.8 <redacted file path> 12.6 <redacted file path> 13.1 <redacted file path>  Platelets 241 236 <redacted file path> 215 <redacted file path> 257 <redacted file path> 211 <redacted file path> 251 <redacted file path>    CMP14+EGFR Component 06/20/24 <redacted file path> 04/14/24 <redacted file path> 03/03/24 <redacted file path> 01/28/24 <redacted file path> 12/03/23 <redacted file path> 09/17/23 <redacted file path>  Glucose 91 99 <redacted file path> 100 <redacted file path> High  -- 94 <redacted file path> 94 <redacted file path>  BUN 28 High  12 <redacted file path> 15 <redacted file path> 23 <redacted file path> 19 <redacted file path> 24 <redacted file path>  Creatinine, Ser 1.43 High  1.18 <redacted file path> High  1.23 <redacted file path> High  0.99 <redacted file path> 1.11 <  redacted file path> High  1.17 <redacted file path> High   eGFR 39 Low  49 <redacted file path> Low  46 <redacted file path> Low  -- 53 <redacted file path> Low  50 <redacted file path> Low   BUN/Creatinine Ratio 20 10 <redacted file path> Low  12 <redacted file path> -- 17 <redacted file path> 21 <redacted file path>  Sodium 141 141 <redacted file path> 141 <redacted file path> 138 <redacted file path> 143 <redacted file path> 141 <redacted file path>  Potassium 4.8 4.4 <redacted file path> 4.4 <redacted file path> 3.4 <redacted file path> Low  4.4 <redacted file path> 4.1 <redacted file path>  Chloride 101 101 <redacted file path> 100 <redacted file path> 103 <redacted file path> 102 <redacted file path> 100 <redacted file path>  CO2 25 25 <redacted file path> 26 <redacted file path> 28 <redacted file path> 28 <redacted file path> 25 <redacted file path>  Calcium  11.0 High  10.2 <redacted file path> 10.6 <redacted file path> High  9.4 <redacted file path> 10.5 <redacted file path> High  10.7  <redacted file path> High   Total Protein 7.5 -- 7.1 <redacted file path> 6.9 <redacted file path> 7.3 <redacted file path> --  Albumin 4.4 -- 4.3 <redacted file path> 3.7 <redacted file path> 4.4 <redacted file path> --    Imaging and Other Studies   Left-sided abdominal pain for over a week,  worsening. Loose stools.   EXAM:  CT ABDOMEN AND PELVIS WITH CONTRAST   TECHNIQUE:  Multidetector CT imaging of the abdomen and pelvis was performed  using the standard protocol following bolus administration of  intravenous contrast.   RADIATION DOSE REDUCTION: This exam was performed according to the  departmental dose-optimization program which includes automated  exposure control, adjustment of the mA and/or kV according to  patient size and/or use of iterative reconstruction technique.   CONTRAST:  80mL OMNIPAQUE  IOHEXOL  350 MG/ML SOLN   COMPARISON:  None Available.   FINDINGS:  Lower chest: Clear lung bases.   Hepatobiliary: No focal liver abnormality is seen. Status post  cholecystectomy. No biliary dilatation.   Pancreas: Unremarkable. No pancreatic ductal dilatation or  surrounding inflammatory changes.   Spleen: Normal size.  No mass.  Single calcified granuloma.   Adrenals/Urinary Tract: Normal adrenal glands.   Kidneys normal in overall size, orientation and position with  symmetric enhancement and excretion. Multiple small renal cysts,  most subcentimeter not fully characterized. Largest midpole of the  left kidney, 1.2 cm. 5 mm angiomyolipoma, anterior upper pole the  right kidney. No suspicious masses. No intrarenal stones. No  hydronephrosis. Normal ureters. Normal bladder.   Stomach/Bowel: Normal stomach. Small bowel and colon are normal in  caliber. No wall thickening. No inflammation. Normal appendix.   Vascular/Lymphatic: No significant vascular abnormality. No enlarged  lymph nodes.   Reproductive: Uterus normal in size. Small anterior subserosal   fibroid. No adnexal masses.   Other: No abdominal wall hernia or abnormality. No abdominopelvic  ascites.   Musculoskeletal: No fracture or acute finding. Previous posterior  fusion at T11-T12. Orthopedic hardware appears well positioned and  seated. No bone lesion. Degenerative changes noted throughout the  visualized spine.   IMPRESSION:  No acute findings within the abdomen or pelvis. No findings to  account for the patient's symptoms. None    Electronically Signed    By: Alm Parkins M.D.    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)   Hyperlipidemia, not otherwise specified   Gastroesophageal reflux disease   Anemia in chronic kidney disease (Chronic)   Hypercalcemia   Obesity, not otherwise specified   Obstructive sleep apnea syndrome   Aquired multiple cysts of kidney   Osteoarthritis, not otherwise specified   Orders Placed This Encounter   Ultrasound renal complete   Renal Function Panel   CBC and Differential   Protein, Total, Random Urine w/Creatinine (Protein/Creat Ratio)   PTH, Intact   Urinalysis   CBC and Differential   Protein, Total, Random Urine w/Creatinine (Protein/Creat Ratio)   PTH, Intact and Calcium    Renal Function Panel   Urinalysis with microscopic        Impression/Recommendations   Diana Green is a 73 y.o. female with past medical history of obesity, obstructive sleep apnea, diabetes, hypertension, congestive heart failure, hyperlipidemia and chronic kidney disease with anemia now referred for renal evaluation.  #1: Chronic kidney disease: Patient is chronic kidney disease stage IIIb with the latest GFR of 39 cc/min.  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  We will  obtain a renal sonogram to assess the kidney size.  Will continue the Jardiance  for long-term preservation of GFR.  #2: Anemia: Patient has anemia most likely secondary to chronic kidney disease versus iron deficiency.  She may need oral iron supplementation.  #3: Proteinuria: Will check urinalysis and random urine protein creatinine ratio.  Will continue the Jardiance  for long-term cardiorenal protection.  #4: Diabetes: Patient has been on Jardiance  and glimepiride .  She is also starting on Mounjaro .  Diet advised.  #5: Hypertension/congestive heart failure: Patient has been on carvedilol , spironolactone  and furosemide  along with losartan .  Will continue the same for now.  She is advised on importance of 2 g salt restricted diet along with fluid restriction.  #6: Obesity: Patient is supposed to be starting Mounjaro .  Dietary advice given to the patient.  #7: Obstructive sleep apnea: She is advised on importance of weight loss.  She may need CPAP machine.  #8: Hypercalcemia: Will check PTH levels and repeat calcium  today.  She is advised to avoid calcium  supplementations and antacids.  Spoke to the patient in detail and answered all her questions to her satisfaction. Diet discussed with the patient. She is advised to avoid nonsteroidal anti-inflammatory drugs.  Will obtain some serologies pertinent to the patient's renal disease and also a renal sonogram to assess the kidney size.   Prescription medications new and current medication dosages, including patient education, medication names, potential side effects, drug interactions, consequences of non compliance were discussed with the patient in detail. Patient expressed understanding.  Spoke to the patient in detail regarding the status of patient's renal function, answered all questions to the patient's satisfaction.  Dear Dr. I Sincerely appreciate the opportunity to participate in the care of your very pleasant patient.  Please do not  hesitate to contact me with any questions or concerns that may arise in regards to the patient's Nephrological care.   Return in about 2 months (around 10/07/2024).  Disclaimer: Much of the narrative of this dictation was acquired using speech recognition software. It is possible that some dictated speech was not transcribed accurately by this system nor detected in the proofing. Such errors are at times unavoidable.    Pinkey Edman, MD [facility] Ph: [phone] Fax: [phone] 08/06/2024

## 2024-08-06 NOTE — Patient Instructions (Addendum)
 Your procedure is scheduled on: 08/18/24 - Monday Report to the Registration Desk on the 1st floor of [facility]. To find out your arrival time, please call [phone] between 1PM - 3PM on: 08/15/24 - Friday If your arrival time is 6:00 am, do not arrive before that time as the [facility] entrance doors do not open until 6:00 am.  REMEMBER: Instructions that are not followed completely may result in serious medical risk, up to and including death; or upon the discretion of your surgeon and anesthesiologist your surgery may need to be rescheduled.  Do not eat food after midnight the night before surgery.  No gum chewing or hard candies.  You may however, drink CLEAR liquids up to 2 hours before you are scheduled to arrive for your surgery. Do not drink anything within 2 hours of your scheduled arrival time.  Clear liquids include: - water    In addition, your doctor has ordered for you to drink the provided:  Gatorade G2 Drinking this carbohydrate drink up to two hours before surgery helps to reduce insulin resistance and improve patient outcomes. Please complete drinking 2 hours before scheduled arrival time.  One week prior to surgery: Stop Anti-inflammatories (NSAIDS) such as Advil , Aleve, Ibuprofen , Motrin , Naproxen, Naprosyn and Aspirin based products such as Excedrin, Goody's Powder, BC Powder. You may continue to take Tylenol  if needed for pain up until the day of surgery.  Stop ANY OVER THE COUNTER supplements until after surgery.  empagliflozin  (JARDIANCE ) stop taking on 12/26.  tirzepatide  (MOUNJARO ) hold 7 days prior to your surgery.  ON THE DAY OF SURGERY ONLY TAKE THESE MEDICATIONS WITH SIPS OF WATER :  carvedilol  (COREG )  DULoxetine  (CYMBALTA  ) gabapentin  (NEURONTIN )    No Alcohol for 24 hours before or after surgery.  No Smoking including e-cigarettes for 24 hours before surgery.  No chewable tobacco products for at least 6 hours before surgery.  No  nicotine patches on the day of surgery.  Do not use any recreational drugs for at least a week (preferably 2 weeks) before your surgery.  Please be advised that the combination of cocaine and anesthesia may have negative outcomes, up to and including death. If you test positive for cocaine, your surgery will be cancelled.  On the morning of surgery brush your teeth with toothpaste and water , you may rinse your mouth with mouthwash if you wish. Do not swallow any toothpaste or mouthwash.  Use CHG Soap or wipes as directed on instruction sheet.  Do not wear jewelry, make-up, hairpins, clips or nail polish.  For welded (permanent) jewelry: bracelets, anklets, waist bands, etc.  Please have this removed prior to surgery.  If it is not removed, there is a chance that hospital personnel will need to cut it off on the day of surgery.  Do not wear lotions, powders, or perfumes.   Do not shave body hair from the neck down 48 hours before surgery.  Contact lenses, hearing aids and dentures may not be worn into surgery.  Do not bring valuables to the hospital. [facility] is not responsible for any missing/lost belongings or valuables.   Notify your doctor if there is any change in your medical condition (cold, fever, infection).  Wear comfortable clothing (specific to your surgery type) to the hospital.  After surgery, you can help prevent lung complications by doing breathing exercises.  Take deep breaths and cough every 1-2 hours. Your doctor may order a device called an Incentive Spirometer to help you  take deep breaths.  If you are being admitted to the hospital overnight, leave your suitcase in the car. After surgery it may be brought to your room.  In case of increased patient census, it may be necessary for you, the patient, to continue your postoperative care in the Same Day Surgery department.  If you are being discharged the day of surgery, you will not be allowed to drive  home. You will need a responsible individual to drive you home and stay with you for 24 hours after surgery.   If you are taking public transportation, you will need to have a responsible individual with you.  Please call the Pre-admissions Testing Dept. at [phone] if you have any questions about these instructions.  Surgery Visitation Policy:  Patients having surgery or a procedure may have two visitors.  Children under the age of 47 must have an adult with them who is not the patient.  Inpatient Visitation:    Visiting hours are 7 a.m. to 8 p.m. Up to four visitors are allowed at one time in a patient room. The visitors may rotate out with other people during the day.  One visitor age 84 or older may stay with the patient overnight and must be in the room by 8 p.m.   Merchandiser, Retail to address health-related social needs:  [email or web address]   Pre-operative 4 CHG Bath Instructions   You can play a key role in reducing the risk of infection after surgery. Your skin needs to be as free of germs as possible. You can reduce the number of germs on your skin by washing with CHG (chlorhexidine  gluconate) soap before surgery. CHG is an antiseptic soap that kills germs and continues to kill germs even after washing.   DO NOT use if you have an allergy to chlorhexidine /CHG or antibacterial soaps. If your skin becomes reddened or irritated, stop using the CHG and notify one of our RNs at [phone].   Please shower with the CHG soap starting 4 days before surgery using the following schedule: 12/25 - 12/28.    Please keep in mind the following:  DO NOT shave, including legs and underarms, starting the day of your first shower.   You may shave your face at any point before/day of surgery.  Place clean sheets on your bed the day you start using CHG soap. Use a clean washcloth (not used since being washed) for each shower. DO NOT sleep with pets once you  start using the CHG.   CHG Shower Instructions:  If you choose to wash your hair and private area, wash first with your normal shampoo/soap.  After you use shampoo/soap, rinse your hair and body thoroughly to remove shampoo/soap residue.  Turn the water  OFF and apply about 3 tablespoons (45 ml) of CHG soap to a CLEAN washcloth.  Apply CHG soap ONLY FROM YOUR NECK DOWN TO YOUR TOES (washing for 3-5 minutes)  DO NOT use CHG soap on face, private areas, open wounds, or sores.  Pay special attention to the area where your surgery is being performed.  If you are having back surgery, having someone wash your back for you may be helpful. Wait 2 minutes after CHG soap is applied, then you may rinse off the CHG soap.  Pat dry with a clean towel  Put on clean clothes/pajamas   If you choose to wear lotion, please use ONLY the CHG-compatible lotions on the back of this paper.     Additional  instructions for the day of surgery: DO NOT APPLY any lotions, deodorants, cologne, or perfumes.   Put on clean/comfortable clothes.  Brush your teeth.  Ask your nurse before applying any prescription medications to the skin.      CHG Compatible Lotions   Aveeno Moisturizing lotion  Cetaphil Moisturizing Cream  Cetaphil Moisturizing Lotion  Clairol Herbal Essence Moisturizing Lotion, Dry Skin  Clairol Herbal Essence Moisturizing Lotion, Extra Dry Skin  Clairol Herbal Essence Moisturizing Lotion, Normal Skin  Curel Age Defying Therapeutic Moisturizing Lotion with Alpha Hydroxy  Curel Extreme Care Body Lotion  Curel Soothing Hands Moisturizing Hand Lotion  Curel Therapeutic Moisturizing Cream, Fragrance-Free  Curel Therapeutic Moisturizing Lotion, Fragrance-Free  Curel Therapeutic Moisturizing Lotion, Original Formula  Eucerin Daily Replenishing Lotion  Eucerin Dry Skin Therapy Plus Alpha Hydroxy Crme  Eucerin Dry Skin Therapy Plus Alpha Hydroxy Lotion  Eucerin Original Crme  Eucerin Original  Lotion  Eucerin Plus Crme Eucerin Plus Lotion  Eucerin TriLipid Replenishing Lotion  Keri Anti-Bacterial Hand Lotion  Keri Deep Conditioning Original Lotion Dry Skin Formula Softly Scented  Keri Deep Conditioning Original Lotion, Fragrance Free Sensitive Skin Formula  Keri Lotion Fast Absorbing Fragrance Free Sensitive Skin Formula  Keri Lotion Fast Absorbing Softly Scented Dry Skin Formula  Keri Original Lotion  Keri Skin Renewal Lotion Keri Silky Smooth Lotion  Keri Silky Smooth Sensitive Skin Lotion  Nivea Body Creamy Conditioning Oil  Nivea Body Extra Enriched Lotion  Nivea Body Original Lotion  Nivea Body Sheer Moisturizing Lotion Nivea Crme  Nivea Skin Firming Lotion  NutraDerm 30 Skin Lotion  NutraDerm Skin Lotion  NutraDerm Therapeutic Skin Cream  NutraDerm Therapeutic Skin Lotion  ProShield Protective Hand Cream  Provon moisturizing lotion  How to Use an Incentive Spirometer  An incentive spirometer is a tool that measures how well you are filling your lungs with each breath. Learning to take long, deep breaths using this tool can help you keep your lungs clear and active. This may help to reverse or lessen your chance of developing breathing (pulmonary) problems, especially infection. You may be asked to use a spirometer: After a surgery. If you have a lung problem or a history of smoking. After a long period of time when you have been unable to move or be active. If the spirometer includes an indicator to show the highest number that you have reached, your health care provider or respiratory therapist will help you set a goal. Keep a log of your progress as told by your health care provider. What are the risks? Breathing too quickly may cause dizziness or cause you to pass out. Take your time so you do not get dizzy or light-headed. If you are in pain, you may need to take pain medicine before doing incentive spirometry. It is harder to take a deep breath if you are  having pain. How to use your incentive spirometer  Sit up on the edge of your bed or on a chair. Hold the incentive spirometer so that it is in an upright position. Before you use the spirometer, breathe out normally. Place the mouthpiece in your mouth. Make sure your lips are closed tightly around it. Breathe in slowly and as deeply as you can through your mouth, causing the piston or the ball to rise toward the top of the chamber. Hold your breath for 3-5 seconds, or for as long as possible. If the spirometer includes a coach indicator, use this to guide you in breathing. Slow down your breathing  if the indicator goes above the marked areas. Remove the mouthpiece from your mouth and breathe out normally. The piston or ball will return to the bottom of the chamber. Rest for a few seconds, then repeat the steps 10 or more times. Take your time and take a few normal breaths between deep breaths so that you do not get dizzy or light-headed. Do this every 1-2 hours when you are awake. If the spirometer includes a goal marker to show the highest number you have reached (best effort), use this as a goal to work toward during each repetition. After each set of 10 deep breaths, cough a few times. This will help to make sure that your lungs are clear. If you have an incision on your chest or abdomen from surgery, place a pillow or a rolled-up towel firmly against the incision when you cough. This can help to reduce pain while taking deep breaths and coughing. General tips When you are able to get out of bed: Walk around often. Continue to take deep breaths and cough in order to clear your lungs. Keep using the incentive spirometer until your health care provider says it is okay to stop using it. If you have been in the hospital, you may be told to keep using the spirometer at home. Contact a health care provider if: You are having difficulty using the spirometer. You have trouble using the spirometer  as often as instructed. Your pain medicine is not giving enough relief for you to use the spirometer as told. You have a fever. Get help right away if: You develop shortness of breath. You develop a cough with bloody mucus from the lungs. You have fluid or blood coming from an incision site after you cough. Summary An incentive spirometer is a tool that can help you learn to take long, deep breaths to keep your lungs clear and active. You may be asked to use a spirometer after a surgery, if you have a lung problem or a history of smoking, or if you have been inactive for a long period of time. Use your incentive spirometer as instructed every 1-2 hours while you are awake. If you have an incision on your chest or abdomen, place a pillow or a rolled-up towel firmly against your incision when you cough. This will help to reduce pain. Get help right away if you have shortness of breath, you cough up bloody mucus, or blood comes from your incision when you cough. This information is not intended to replace advice given to you by your health care provider. Make sure you discuss any questions you have with your health care provider. Document Revised: 10/27/2019 Document Reviewed: 10/27/2019 Elsevier Patient Education  2023 [employer].

## 2024-08-07 ENCOUNTER — Encounter: Payer: Self-pay | Admitting: Obstetrics

## 2024-08-07 ENCOUNTER — Ambulatory Visit: Admitting: Internal Medicine

## 2024-08-07 ENCOUNTER — Encounter: Payer: Self-pay | Admitting: Internal Medicine

## 2024-08-07 VITALS — BP 120/82 | HR 65 | Ht 69.0 in | Wt 266.0 lb

## 2024-08-07 DIAGNOSIS — E782 Mixed hyperlipidemia: Secondary | ICD-10-CM | POA: Diagnosis not present

## 2024-08-07 DIAGNOSIS — N1832 Chronic kidney disease, stage 3b: Secondary | ICD-10-CM

## 2024-08-07 DIAGNOSIS — E1159 Type 2 diabetes mellitus with other circulatory complications: Secondary | ICD-10-CM

## 2024-08-07 DIAGNOSIS — E0800 Diabetes mellitus due to underlying condition with hyperosmolarity without nonketotic hyperglycemic-hyperosmolar coma (NKHHC): Secondary | ICD-10-CM | POA: Diagnosis not present

## 2024-08-07 DIAGNOSIS — G4733 Obstructive sleep apnea (adult) (pediatric): Secondary | ICD-10-CM

## 2024-08-07 DIAGNOSIS — Z0000 Encounter for general adult medical examination without abnormal findings: Secondary | ICD-10-CM | POA: Insufficient documentation

## 2024-08-07 DIAGNOSIS — E1169 Type 2 diabetes mellitus with other specified complication: Secondary | ICD-10-CM

## 2024-08-07 DIAGNOSIS — I152 Hypertension secondary to endocrine disorders: Secondary | ICD-10-CM | POA: Diagnosis not present

## 2024-08-07 DIAGNOSIS — Z6839 Body mass index (BMI) 39.0-39.9, adult: Secondary | ICD-10-CM | POA: Insufficient documentation

## 2024-08-07 DIAGNOSIS — J302 Other seasonal allergic rhinitis: Secondary | ICD-10-CM | POA: Diagnosis not present

## 2024-08-07 DIAGNOSIS — E66812 Obesity, class 2: Secondary | ICD-10-CM | POA: Diagnosis not present

## 2024-08-07 DIAGNOSIS — Z0001 Encounter for general adult medical examination with abnormal findings: Secondary | ICD-10-CM | POA: Diagnosis not present

## 2024-08-07 DIAGNOSIS — I5021 Acute systolic (congestive) heart failure: Secondary | ICD-10-CM

## 2024-08-07 LAB — POC CREATINE & ALBUMIN,URINE
Albumin/Creatinine Ratio, Urine, POC: <30
Creatinine, POC: 200 mg/dL
Microalbumin Ur, POC: 30 mg/L

## 2024-08-07 MED ORDER — FLUTICASONE PROPIONATE 50 MCG/ACT NA SUSP: 1.0000 | Freq: Every day | NASAL | 3 refills | Status: AC

## 2024-08-07 MED ORDER — LORATADINE 10 MG PO TABS: 10.0000 mg | ORAL_TABLET | Freq: Every day | ORAL | 2 refills | Status: AC

## 2024-08-07 NOTE — Progress Notes (Signed)
 Established Patient Office Visit  Subjective:  Patient ID: Diana Green, female    DOB: 07/04/1951  Age: 73 y.o. MRN: [id number]  Chief Complaint  Patient presents with   Annual Exam    AWV, needs uACR    Patient is here today for her Medicare AWV. She reports feeling fairly well today.  Patient reports she has not started the Mounjaro  injection at this time due to not being able to afford monthly payment. She reports fasting blood sugars at home 90-105. She states [facility] did not send her refill on Amaryl . Advised patient to call them about getting it sent to her.  She reports she has gotten her CPAP machine approved but she does not have the money to pay for it at this time. Reinforced need for CPAP machine based of sleep study results and that when she is able to purchase the machine and start using it nightly.  She reports having issues with nasal congestion and runny nose. She denies fever and chills or discolored sputum. Recommend to start Flonase  and Claritin  daily as she has done this in the past to help with allergies. She states she needs refills.  She has upcoming left knee revision that she states she hopes will reduce her pain and can become more active. Patient states she plans to be in rehab facility after her knee surgery since she lives home alone and does not have any help.   PHQ-9 score today 4; GAD-7 score 6; 6CIT score today 4.  She is not due for labs at this time. Will have patient FU after her knee surgery and she is discharged from rehab facility to get blood work completed. Reinforced healthy diet and exercise as tolerated.    No other concerns at this time.   Past Medical History:  Diagnosis Date   Arthritis    CHF (congestive heart failure) (HCC)    Chronic kidney disease    Depression    Diabetes mellitus without complication (HCC)    Essential hypertension, benign 10/09/2022   GERD (gastroesophageal reflux disease)    occ no meds   High cholesterol     Hypertension    Impaired glucose tolerance 10/09/2022   Iron deficiency anemia 10/09/2022   Sleep apnea    Vitamin D  deficiency 10/09/2022    Past Surgical History:  Procedure Laterality Date   ANTERIOR CERVICAL DECOMP/DISCECTOMY FUSION N/A 07/08/2021   Procedure: C3-4 ANTERIOR CERVICAL DECOMPRESSION/DISCECTOMY FUSION 1 LEVEL WITH HEDRON;  Surgeon: Clois Fret, MD;  Location: [facility];  Service: Neurosurgery;  Laterality: N/A;   APPLICATION OF INTRAOPERATIVE CT SCAN N/A 09/19/2021   Procedure: APPLICATION OF INTRAOPERATIVE CT SCAN;  Surgeon: Clois Fret, MD;  Location: [facility];  Service: Neurosurgery;  Laterality: N/A;   APPLICATION OF WOUND VAC  09/19/2021   Procedure: APPLICATION OF WOUND VAC;  Surgeon: Clois Fret, MD;  Location: [facility];  Service: Neurosurgery;;  [facility]a   BACK SURGERY     thoracic   CESAREAN SECTION  1971   CHOLECYSTECTOMY     COLONOSCOPY     ROTATOR CUFF REPAIR Right 2006   TOTAL KNEE ARTHROPLASTY Left 2014   TOTAL KNEE ARTHROPLASTY Right 09/11/2022   Procedure: TOTAL KNEE ARTHROPLASTY;  Surgeon: Lorelle Hussar, MD;  Location: [facility];  Service: Orthopedics;  Laterality: Right;   TUBAL LIGATION  1975    Social History   Socioeconomic History   Marital status: Widowed    Spouse name: Not on file   Number of  children: Not on file   Years of education: Not on file   Highest education level: Not on file  Occupational History   Not on file  Tobacco Use   Smoking status: Never   Smokeless tobacco: Never  Vaping Use   Vaping status: Never Used  Substance and Sexual Activity   Alcohol use: Not Currently    Comment: rarely   Drug use: No   Sexual activity: Not Currently    Birth control/protection: Post-menopausal  Other Topics Concern   Not on file  Social History Narrative   Lives alone   Social Drivers of Health   Tobacco Use: Low Risk (08/07/2024)   Patient History    Smoking Tobacco Use: Never    Smokeless  Tobacco Use: Never    Passive Exposure: Not on file  Financial Resource Strain: High Risk (08/01/2024)   Received from [facility]   Overall Financial Resource Strain (CARDIA)    Difficulty of Paying Living Expenses: Hard  Food Insecurity: Food Insecurity Present (08/01/2024)   Received from [facility]   Epic    Within the past 12 months, you worried that your food would run out before you got the money to buy more.: Sometimes true    Within the past 12 months, the food you bought just didn't last and you didn't have money to get more.: Often true  Transportation Needs: No Transportation Needs (08/01/2024)   Received from [facility] - Transportation    In the past 12 months, has lack of transportation kept you from medical appointments or from getting medications?: No    Lack of Transportation (Non-Medical): No  Physical Activity: Not on file  Stress: Not on file  Social Connections: Not on file  Intimate Partner Violence: Not At Risk (09/11/2022)   Humiliation, Afraid, Rape, and Kick questionnaire    Fear of Current or Ex-Partner: No    Emotionally Abused: No    Physically Abused: No    Sexually Abused: No  Depression (PHQ2-9): Low Risk (08/07/2024)   Depression (PHQ2-9)    PHQ-2 Score: 4  Alcohol Screen: Not on file  Housing: Low Risk  (08/01/2024)   Received from [facility]   Epic    In the last 12 months, was there a time when you were not able to pay the mortgage or rent on time?: No    In the past 12 months, how many times have you moved where you were living?: 0    At any time in the past 12 months, were you homeless or living in a shelter (including now)?: No  Utilities: Not At Risk (08/01/2024)   Received from [facility]   Epic    In the past 12 months has the electric, gas, oil, or water  company threatened to shut off services in your home?: No  Health Literacy: Not  on file    No family history on file.  Allergies[1]  Show/hide medication list[2]  Review of Systems  Constitutional: Negative.  Negative for chills, fever and malaise/fatigue.  HENT:  Positive for congestion. Negative for sore throat.   Eyes: Negative.  Negative for blurred vision and pain.  Respiratory: Negative.  Negative for cough and shortness of breath.   Cardiovascular: Negative.  Negative for chest pain, palpitations and leg swelling.  Gastrointestinal: Negative.  Negative for abdominal pain, blood in stool, constipation, diarrhea, heartburn, melena, nausea and vomiting.  Genitourinary: Negative.  Negative for dysuria, flank pain, frequency and urgency.  Musculoskeletal:  Positive for joint pain (left knee). Negative for myalgias.  Skin: Negative.   Neurological: Negative.  Negative for dizziness, tingling, sensory change, weakness and headaches.  Endo/Heme/Allergies: Negative.   Psychiatric/Behavioral: Negative.  Negative for depression and suicidal ideas. The patient is not nervous/anxious.        Objective:   BP 120/82   Pulse 65   Ht 5' 9 (1.753 m)   Wt 266 lb (120.7 kg)   SpO2 97%   BMI 39.28 kg/m   Vitals:   08/07/24 1101  BP: 120/82  Pulse: 65  Height: 5' 9 (1.753 m)  Weight: 266 lb (120.7 kg)  SpO2: 97%  BMI (Calculated): 39.26    Physical Exam   Results for orders placed or performed in visit on 08/07/24  POC CREATINE & ALBUMIN,URINE  Result Value Ref Range   Microalbumin Ur, POC 30 mg/L   Creatinine, POC 200 mg/dL   Albumin/Creatinine Ratio, Urine, POC <30     Recent Results (from the past 2160 hours)  CMP14+EGFR     Status: Abnormal   Collection Time: 06/20/24 12:08 PM  Result Value Ref Range   Glucose 91 70 - 99 mg/dL   BUN 28 (H) 8 - 27 mg/dL   Creatinine, Ser 8.56 (H) 0.57 - 1.00 mg/dL   eGFR 39 (L) >40 fO/fpw/8.26   BUN/Creatinine Ratio 20 12 - 28   Sodium 141 134 - 144 mmol/L   Potassium 4.8 3.5 - 5.2 mmol/L   Chloride 101  96 - 106 mmol/L   CO2 25 20 - 29 mmol/L   Calcium  11.0 (H) 8.7 - 10.3 mg/dL   Total Protein 7.5 6.0 - 8.5 g/dL   Albumin 4.4 3.8 - 4.8 g/dL   Globulin, Total 3.1 1.5 - 4.5 g/dL   Bilirubin Total 0.3 0.0 - 1.2 mg/dL   Alkaline Phosphatase 69 49 - 135 IU/L   AST 29 0 - 40 IU/L   ALT 31 0 - 32 IU/L  Lipid Panel w/o Chol/HDL Ratio     Status: None   Collection Time: 06/20/24 12:08 PM  Result Value Ref Range   Cholesterol, Total 147 100 - 199 mg/dL   Triglycerides 890 0 - 149 mg/dL   HDL 55 >60 mg/dL   VLDL Cholesterol Cal 20 5 - 40 mg/dL   LDL Chol Calc (NIH) 72 0 - 99 mg/dL  CBC with Diff     Status: None   Collection Time: 06/20/24 12:08 PM  Result Value Ref Range   WBC 5.2 3.4 - 10.8 x10E3/uL   RBC 4.01 3.77 - 5.28 x10E6/uL   Hemoglobin 11.6 11.1 - 15.9 g/dL   Hematocrit 63.4 65.9 - 46.6 %   MCV 91 79 - 97 fL   MCH 28.9 26.6 - 33.0 pg   MCHC 31.8 31.5 - 35.7 g/dL   RDW 87.0 88.2 - 84.5 %   Platelets 241 150 - 450 x10E3/uL   Neutrophils 58 Not Estab. %   Lymphs 28 Not Estab. %   Monocytes 9 Not Estab. %   Eos 4 Not Estab. %   Basos 1 Not Estab. %   Neutrophils Absolute 3.0 1.4 - 7.0 x10E3/uL   Lymphocytes Absolute 1.5 0.7 - 3.1 x10E3/uL   Monocytes Absolute 0.5 0.1 - 0.9 x10E3/uL   EOS (ABSOLUTE) 0.2 0.0 - 0.4 x10E3/uL   Basophils Absolute 0.0 0.0 - 0.2 x10E3/uL   Immature Granulocytes 0 Not Estab. %  Immature Grans (Abs) 0.0 0.0 - 0.1 x10E3/uL  Hemoglobin A1c     Status: Abnormal   Collection Time: 06/20/24 12:08 PM  Result Value Ref Range   Hgb A1c MFr Bld 6.1 (H) 4.8 - 5.6 %    Comment:          Prediabetes: 5.7 - 6.4          Diabetes: >6.4          Glycemic control for adults with diabetes: <7.0    Est. average glucose Bld gHb Est-mCnc 128 mg/dL  POCT CBG (Fasting - Glucose)     Status: Abnormal   Collection Time: 07/08/24 11:25 AM  Result Value Ref Range   Glucose Fasting, POC 127 (A) 70 - 99 mg/dL  POC CREATINE & ALBUMIN,URINE     Status: Normal    Collection Time: 08/07/24 11:51 AM  Result Value Ref Range   Microalbumin Ur, POC 30 mg/L   Creatinine, POC 200 mg/dL   Albumin/Creatinine Ratio, Urine, POC <30       Assessment & Plan:  Continue medications as prescribed. Reinforced healthy diet and exercise as tolerated. Keep specialist appointments as scheduled.  Problem List Items Addressed This Visit       Cardiovascular and Mediastinum   Hypertension associated with diabetes (HCC)   CHF (congestive heart failure), NYHA class I, acute, systolic (HCC)     Respiratory   OSA (obstructive sleep apnea)   Seasonal allergic rhinitis   Relevant Medications   fluticasone  (FLONASE ) 50 MCG/ACT nasal spray   loratadine  (CLARITIN ) 10 MG tablet     Endocrine   Diabetes mellitus due to underlying condition with hyperosmolarity without coma, without long-term current use of insulin (HCC)   Relevant Orders   POC CREATINE & ALBUMIN,URINE (Completed)   Combined hyperlipidemia associated with type 2 diabetes mellitus (HCC)     Genitourinary   CKD stage 3b, GFR 30-44 ml/min (HCC)     Other   Class 2 severe obesity due to excess calories with serious comorbidity and body mass index (BMI) of 39.0 to 39.9 in adult   Medicare annual wellness visit, subsequent - Primary    Return in about 8 weeks (around 10/02/2024).   Total time spent: 30  minutes. This time includes review of previous notes and results and patient face to face interaction during today's visit.    FERNAND FREDY RAMAN, MD  08/07/2024   This document may have been prepared by [facility] Voice Recognition software and as such may include unintentional dictation errors.     [1] No Known Allergies [2]  Outpatient Medications Prior to Visit  Medication Sig   acetaminophen  (TYLENOL ) 500 MG tablet Take 1,000 mg by mouth 3 (three) times daily.   carvedilol  (COREG ) 25 MG tablet Take 1 tablet (25 mg total) by mouth 2 (two) times daily.   DULoxetine  (CYMBALTA ) 60 MG capsule TAKE 1  CAPSULE BY MOUTH IN THE  MORNING   empagliflozin  (JARDIANCE ) 10 MG TABS tablet Take 10 mg by mouth daily.   furosemide  (LASIX ) 40 MG tablet Take 1 tablet (40 mg total) by mouth daily.   gabapentin  (NEURONTIN ) 600 MG tablet TAKE 1 TABLET BY MOUTH TWICE  DAILY   glimepiride  (AMARYL ) 2 MG tablet Take 1 tablet (2 mg total) by mouth daily with breakfast.   Lidocaine  4 % PTCH Place 1 patch onto the skin daily as needed (back pain).   loperamide  (IMODIUM  A-D) 2 MG capsule Take 1 capsule (2 mg total) by mouth  as needed for diarrhea or loose stools.   losartan  (COZAAR ) 100 MG tablet Take 1 tablet (100 mg total) by mouth daily.   meclizine  (ANTIVERT ) 25 MG tablet Take 1 tablet (25 mg total) by mouth 3 (three) times daily as needed for dizziness.   rosuvastatin  (CRESTOR ) 20 MG tablet TAKE 1 TABLET BY MOUTH DAILY   spironolactone  (ALDACTONE ) 25 MG tablet Take 1 tablet (25 mg total) by mouth daily.   tirzepatide  (MOUNJARO ) 2.5 MG/0.5ML Pen Inject 2.5 mg into the skin once a week. (Patient not taking: Reported on 08/07/2024)   No facility-administered medications prior to visit.

## 2024-08-08 ENCOUNTER — Ambulatory Visit: Admitting: Cardiovascular Disease

## 2024-08-08 ENCOUNTER — Encounter: Payer: Self-pay | Admitting: Orthopedic Surgery

## 2024-08-08 NOTE — Progress Notes (Incomplete)
 " Perioperative / Anesthesia Services  Pre-Admission Testing Clinical Review / Pre-Operative Anesthesia Consult  Date: 08/08/2024  PATIENT DEMOGRAPHICS: Name: Diana Green DOB: 1950/12/10 MRN:   [id number]  Note: Available PAT nursing documentation and vital signs have been reviewed. Clinical nursing staff has updated patient's PMH/PSHx, current medication list, and drug allergies/intolerances to ensure complete and comprehensive history available to assist care teams in MDM as it pertains to the aforementioned surgical procedure and anticipated anesthetic course. Extensive review of available clinical information personally performed. Nursing documentation reviewed. [place] [facility] and PSHx updated with any diagnoses and/or procedures that I have knowledge of that may have been inadvertently omitted during her intake with the pre-admission testing department's nursing staff.  PLANNED SURGICAL PROCEDURE(S):   Case: [id number] Date/Time: 08/18/24 0953   Procedure: TOTAL KNEE REVISION (Left: Knee)   Anesthesia type: Choice   Diagnosis: Pain due to total left knee replacement, initial encounter [T84.84XA, Z96.652]   Pre-op diagnosis: Pain due to total left knee replacement, initial encounter T84.84XA, S03.347   Location: [facility] OR ROOM 02 / [facility]   Surgeons: Lorelle Hussar, MD        CLINICAL DISCUSSION: Diana Green is a 73 y.o. female who is submitted for pre-surgical anesthesia review and clearance prior to her undergoing the above procedure. Patient has never been a smoker in the past. Pertinent PMH includes: hypertensive cardiomyopathy, HFrEF, RBBB, chronic cerebral microvascular disease, HTN, HLD, T2DM, CKD-III, OSAH (on nocturnal PAP therapy), dyspnea, GERD (no daily Tx), IDA, OA, cervical DDD, thoracolumbar DDD, depression.  Patient is followed by cardiology Orvil, MD). She was last seen in the cardiology clinic on ***; notes reviewed. ***At the time of her clinic  visit, patient doing well overall from a cardiovascular perspective. Patient denied any chest pain, shortness of breath, PND, orthopnea, palpitations, significant peripheral edema, weakness, fatigue, vertiginous symptoms, or presyncope/syncope. Patient with a past medical history significant for cardiovascular diagnoses. Documented physical exam was grossly benign, providing no evidence of acute exacerbation and/or decompensation of the patient's known cardiovascular conditions.  ***  Hypertensive cardiomyopathy with resulting HFrEF being managed on GDMT including beta-blocker (carvedilol ), diuretic (furosemide ), ARB (losartan ), and MRA (spironolactone ) therapies.  Patient is on rosuvastatin  for her HLD diagnosis and further ASCVD prevention.  T2DM well-controlled on currently prescribed regimen; last HgbA1c was 6.1% when checked on 06/20/2024. In the setting of known cardiovascular diagnoses and concurrent T2DM, patient is taking an SGLT2i (empagliflozin ) for added cardiovascular and renovascular protection.  Patient does have an OSAH diagnosis and is reportedly compliant with prescribed nocturnal PAP therapy. Patient is able to complete all of her  ADL/IADLs without cardiovascular limitation.  Per the DASI, patient is able to achieve at least 4 METS of physical activity without experiencing any significant degree of angina/anginal equivalent symptoms. No changes were made to her medication regimen during her visit with cardiology.  Patient scheduled to follow-up with outpatient cardiology in 5 weeks or sooner if needed.  Malina Geers Musso is scheduled for an elective TOTAL KNEE REVISION (Left: Knee) on 08/19/2023 with Dr. Hussar Lorelle, MD. Given patient's past medical history significant for cardiovascular diagnoses, presurgical cardiac clearance was sought by the PAT team. Per cardiology, this patient is optimized for surgery and may proceed with the planned procedural course with a ACCEPTABLE risk of  significant perioperative cardiovascular complications.  In review of her medication reconciliation, the patient is not noted to be taking any type of anticoagulation or antiplatelet therapies that would need  to be held during her perioperative course.  Patient denies previous perioperative complications with anesthesia in the past. In review her EMR, it is noted that patient underwent a general anesthetic course here at [facility] ([facility]) in 08/2022 without documented complications.   MOST RECENT VITAL SIGNS:    08/07/2024   11:01 AM 07/08/2024   11:16 AM 07/04/2024   10:20 AM  Vitals with BMI  Height 5' 9 5' 9 5' 9  Weight 266 lbs 266 lbs 3 oz 267 lbs  BMI 39.26 39.29 39.41  Systolic 120 116 891  Diastolic 82 74 60  Pulse 65 78 71   PROVIDERS/SPECIALISTS: NOTE: Primary physician provider listed below. Patient may have been seen by APP or partner within same practice.   PROVIDER ROLE / SPECIALTY LAST [facility] Lorelle Hussar, MD Orthopedics (Surgeon) 08/01/2024  Fernand Fredy RAMAN, MD Primary Care Provider 08/07/2024  Fernand Alter, MD Cardiology 07/04/2024  Dominica Russian, MD Nephrology 08/06/2024   ALLERGIES: Allergies[1]  CURRENT HOME MEDICATIONS:  acetaminophen  (TYLENOL ) 500 MG tablet   carvedilol  (COREG ) 25 MG tablet   DULoxetine  (CYMBALTA ) 60 MG capsule   empagliflozin  (JARDIANCE ) 10 MG TABS tablet   fluticasone  (FLONASE ) 50 MCG/ACT nasal spray   furosemide  (LASIX ) 40 MG tablet   gabapentin  (NEURONTIN ) 600 MG tablet   glimepiride  (AMARYL ) 2 MG tablet   Lidocaine  4 % PTCH   loperamide  (IMODIUM  A-D) 2 MG capsule   loratadine  (CLARITIN ) 10 MG tablet   losartan  (COZAAR ) 100 MG tablet   meclizine  (ANTIVERT ) 25 MG tablet   rosuvastatin  (CRESTOR ) 20 MG tablet   spironolactone  (ALDACTONE ) 25 MG tablet   tirzepatide  (MOUNJARO ) 2.5 MG/0.5ML Pen   HISTORY: Past Medical History:  Diagnosis Date   Arthritis    Cardiomyopathy due to  hypertension, with heart failure (HCC)    CKD (chronic kidney disease), stage III (HCC)    Depression    Dyspnea    GERD (gastroesophageal reflux disease)    HFrEF (heart failure with reduced ejection fraction) (HCC)    High cholesterol    Hypertension    Iron deficiency anemia 10/09/2022   OSA on CPAP    T2DM (type 2 diabetes mellitus) (HCC)    Vitamin D  deficiency 10/09/2022   Past Surgical History:  Procedure Laterality Date   ANTERIOR CERVICAL DECOMP/DISCECTOMY FUSION N/A 07/08/2021   Procedure: C3-4 ANTERIOR CERVICAL DECOMPRESSION/DISCECTOMY FUSION 1 LEVEL WITH HEDRON;  Surgeon: Clois Fret, MD;  Location: [facility] ORS;  Service: Neurosurgery;  Laterality: N/A;   APPLICATION OF INTRAOPERATIVE CT SCAN N/A 09/19/2021   Procedure: APPLICATION OF INTRAOPERATIVE CT SCAN;  Surgeon: Clois Fret, MD;  Location: [facility] ORS;  Service: Neurosurgery;  Laterality: N/A;   APPLICATION OF WOUND VAC  09/19/2021   Procedure: APPLICATION OF WOUND VAC;  Surgeon: Clois Fret, MD;  Location: [facility] ORS;  Service: Neurosurgery;;  [facility]   BACK SURGERY     thoracic   CESAREAN SECTION  1971   CHOLECYSTECTOMY     COLONOSCOPY     ROTATOR CUFF REPAIR Right 2006   TOTAL KNEE ARTHROPLASTY Left 2014   TOTAL KNEE ARTHROPLASTY Right 09/11/2022   Procedure: TOTAL KNEE ARTHROPLASTY;  Surgeon: Lorelle Hussar, MD;  Location: [facility] ORS;  Service: Orthopedics;  Laterality: Right;   TUBAL LIGATION  1975   No family history on file. Social History   Tobacco Use   Smoking status: Never   Smokeless tobacco: Never  Substance Use Topics   Alcohol use: Not Currently  Comment: rarely   LABS:  Office Visit on 08/07/2024  Component Date Value Ref Range Status   Microalbumin Ur, POC 08/07/2024 30  mg/L Final   Creatinine, POC 08/07/2024 200  mg/dL Final   Albumin/Creatinine Ratio, Urine, P* 08/07/2024 <30   Final    ECG: Date: 06/05/2024  Time ECG obtained: 1231 PM Rate: 58 bpm Rhythm: sinus  bradycardia; RBBB Axis (leads I and aVF): left Intervals: PR 188 ms. QRS 158 ms. QTc 451 ms. ST segment and T wave changes: No evidence of acute T wave abnormalities or significant ST segment elevation or depression.  Evidence of a possible, age undetermined, prior infarct:  No Comparison: Similar to previous tracing obtained on 04/14/2024   IMAGING / PROCEDURES: TRANSTHORACIC ECHOCARDIOGRAM performed on 06/24/2024 Low normal left ventricular systolic function with an EF of 50% Mild LVH Left ventricular diastolic Doppler parameters consistent with abnormal relaxation (G1DD). Normal right ventricular size and function Trivial AR, PR, and TR; mild MR RVSP = 20 mmHg Normal gradients; no valvular stenosis  MYOCARDIAL PERFUSION IMAGING STUDY (LEXISCAN) performed on 05/29/2024 Normal left ventricular systolic function with an EF of 57% No regional wall motion abnormalities Mild reversible septal defect. Possible small area of ischemia in the LAD territory  DIAGNOSTIC RADIOGRAPHS OF LEFT KNEE 3 VIEWS performed on 05/09/2024 Patient is status post cemented total knee arthroplasty with patellar resurfacing.   The patella shows some degenerative changes and that it is eroding over the lateral femoral condyle with bony remodeling.   There is also some questionable osteolysis behind the femoral component.   No fractures or dislocations noted.   MR BRAIN WO CONTRAST performed on 01/28/2024 No acute intracranial abnormality. Mild chronic microvascular ischemic disease for age.  CT ABDOMEN PELVIS W CONTRAST performed on 07/29/2023 No acute findings within the abdomen or pelvis.  No findings to account for the patient's symptoms.   IMPRESSION AND PLAN: Diana Green has been referred for pre-anesthesia review and clearance prior to her undergoing the planned anesthetic and procedural courses. Available labs, pertinent testing, and imaging results were personally reviewed by me in preparation  for upcoming operative/procedural course. [facility] medical record has been updated following extensive record review and patient interview with PAT staff.   ***Preopfix ***PPM  ATTENTION --> PENDING CLEARANCE AT THIS TIME -- NOTE/CONTENTS NOT FINAL UNTIL SIGNED This patient has been appropriately cleared by cardiology with an overall *** risk of patient experiencing significant perioperative cardiovascular complications. here at [facility]. Based on clinical review performed today (08/08/2024), barring any significant acute changes in the patient's overall condition, it is anticipated that she will be able to proceed with the planned surgical intervention. Any acute changes in clinical condition may necessitate her procedure being postponed and/or cancelled. Patient will meet with anesthesia team (MD and/or CRNA) on the day of her procedure for preoperative evaluation/assessment. Questions regarding anesthetic course will be fielded at that time.   Pre-surgical instructions were reviewed with the patient during his PAT appointment, and questions were fielded to satisfaction by PAT clinical staff. She has been instructed on which medications that she will need to hold prior to surgery, as well as the ones that have been deemed safe/appropriate to take on the day of her procedure. As part of the general education provided by PAT, patient made aware both verbally and in writing, that she would need to abstain from the use of any illegal substances during her perioperative course. She was advised that failure  to follow the provided instructions could necessitate case cancellation or result in serious perioperative complications up to and including death. Patient encouraged to contact PAT and/or her surgeon's office to discuss any questions or concerns that may arise prior to surgery; verbalized understanding.   Dorise Pereyra, MSN, APRN, FNP-C, CEN [facility]  Perioperative Services Nurse Practitioner Phone: [phone] Fax: [phone] 08/08/2024 9:11 AM  NOTE: This note has been prepared using Dragon dictation software. Despite my best ability to proofread, there is always the potential that unintentional transcriptional errors may still occur from this process.    [1] No Known Allergies  "

## 2024-08-11 ENCOUNTER — Encounter
Admission: RE | Admit: 2024-08-11 | Discharge: 2024-08-11 | Disposition: A | Discharge status: Home / Self Care | Source: Ambulatory Visit | Attending: Orthopedic Surgery | Admitting: Orthopedic Surgery

## 2024-08-11 DIAGNOSIS — Z01812 Encounter for preprocedural laboratory examination: Secondary | ICD-10-CM | POA: Insufficient documentation

## 2024-08-11 DIAGNOSIS — Z96651 Presence of right artificial knee joint: Secondary | ICD-10-CM

## 2024-08-11 LAB — SURGICAL PCR SCREEN
MRSA, PCR: NEGATIVE
Staphylococcus aureus: NEGATIVE

## 2024-08-12 ENCOUNTER — Ambulatory Visit: Admission: RE | Admit: 2024-08-12 | Source: Ambulatory Visit

## 2024-08-12 LAB — URINALYSIS, COMPLETE (UACMP) WITH MICROSCOPIC
Bilirubin Urine: NEGATIVE
Glucose, UA: >=500 mg/dL — AB
Hgb urine dipstick: NEGATIVE
Ketones, ur: NEGATIVE mg/dL
Leukocytes,Ua: NEGATIVE
Nitrite: NEGATIVE
Protein, ur: NEGATIVE mg/dL
Specific Gravity, Urine: 1.003 — ABNORMAL LOW (ref 1.005–1.030)
Squamous Epithelial / HPF: 0 /HPF (ref 0–5)
WBC, UA: 0 WBC/hpf (ref 0–5)
pH: 6 (ref 5.0–8.0)

## 2024-08-12 LAB — TYPE AND SCREEN
ABO/RH(D): A POS
Antibody Screen: NEGATIVE

## 2024-08-18 ENCOUNTER — Inpatient Hospital Stay

## 2024-08-18 ENCOUNTER — Inpatient Hospital Stay: Payer: Self-pay | Admitting: Urgent Care

## 2024-08-18 ENCOUNTER — Encounter
Admission: RE | Disposition: A | Discharge status: Skilled Nursing Facility | Payer: Self-pay | Source: Home / Self Care | Attending: Orthopedic Surgery

## 2024-08-18 ENCOUNTER — Inpatient Hospital Stay
Admission: RE | Admit: 2024-08-18 | Discharge: 2024-08-22 | DRG: 467 | Disposition: A | Discharge status: Skilled Nursing Facility | Attending: Orthopedic Surgery | Admitting: Orthopedic Surgery

## 2024-08-18 ENCOUNTER — Other Ambulatory Visit: Payer: Self-pay

## 2024-08-18 ENCOUNTER — Encounter: Payer: Self-pay | Admitting: Orthopedic Surgery

## 2024-08-18 DIAGNOSIS — Z6839 Body mass index (BMI) 39.0-39.9, adult: Secondary | ICD-10-CM

## 2024-08-18 DIAGNOSIS — Z8249 Family history of ischemic heart disease and other diseases of the circulatory system: Secondary | ICD-10-CM

## 2024-08-18 DIAGNOSIS — E0800 Diabetes mellitus due to underlying condition with hyperosmolarity without nonketotic hyperglycemic-hyperosmolar coma (NKHHC): Secondary | ICD-10-CM

## 2024-08-18 DIAGNOSIS — I5022 Chronic systolic (congestive) heart failure: Secondary | ICD-10-CM | POA: Diagnosis present

## 2024-08-18 DIAGNOSIS — K219 Gastro-esophageal reflux disease without esophagitis: Secondary | ICD-10-CM | POA: Diagnosis present

## 2024-08-18 DIAGNOSIS — Z1152 Encounter for screening for COVID-19: Secondary | ICD-10-CM

## 2024-08-18 DIAGNOSIS — J101 Influenza due to other identified influenza virus with other respiratory manifestations: Secondary | ICD-10-CM | POA: Diagnosis present

## 2024-08-18 DIAGNOSIS — Y792 Prosthetic and other implants, materials and accessory orthopedic devices associated with adverse incidents: Secondary | ICD-10-CM | POA: Diagnosis present

## 2024-08-18 DIAGNOSIS — T8484XA Pain due to internal orthopedic prosthetic devices, implants and grafts, initial encounter: Principal | ICD-10-CM | POA: Diagnosis present

## 2024-08-18 DIAGNOSIS — Z96652 Presence of left artificial knee joint: Principal | ICD-10-CM

## 2024-08-18 DIAGNOSIS — E1122 Type 2 diabetes mellitus with diabetic chronic kidney disease: Secondary | ICD-10-CM | POA: Diagnosis present

## 2024-08-18 DIAGNOSIS — T84033A Mechanical loosening of internal left knee prosthetic joint, initial encounter: Secondary | ICD-10-CM | POA: Diagnosis present

## 2024-08-18 DIAGNOSIS — N1832 Chronic kidney disease, stage 3b: Secondary | ICD-10-CM | POA: Diagnosis present

## 2024-08-18 DIAGNOSIS — I130 Hypertensive heart and chronic kidney disease with heart failure and stage 1 through stage 4 chronic kidney disease, or unspecified chronic kidney disease: Secondary | ICD-10-CM | POA: Diagnosis present

## 2024-08-18 DIAGNOSIS — Z01812 Encounter for preprocedural laboratory examination: Secondary | ICD-10-CM

## 2024-08-18 DIAGNOSIS — F411 Generalized anxiety disorder: Secondary | ICD-10-CM | POA: Diagnosis present

## 2024-08-18 DIAGNOSIS — F32A Depression, unspecified: Secondary | ICD-10-CM | POA: Diagnosis present

## 2024-08-18 DIAGNOSIS — R7302 Impaired glucose tolerance (oral): Secondary | ICD-10-CM

## 2024-08-18 DIAGNOSIS — E782 Mixed hyperlipidemia: Secondary | ICD-10-CM | POA: Diagnosis present

## 2024-08-18 DIAGNOSIS — I43 Cardiomyopathy in diseases classified elsewhere: Secondary | ICD-10-CM | POA: Diagnosis present

## 2024-08-18 DIAGNOSIS — Z79899 Other long term (current) drug therapy: Secondary | ICD-10-CM

## 2024-08-18 DIAGNOSIS — Z59868 Other specified financial insecurity: Secondary | ICD-10-CM | POA: Diagnosis not present

## 2024-08-18 DIAGNOSIS — G4733 Obstructive sleep apnea (adult) (pediatric): Secondary | ICD-10-CM | POA: Diagnosis present

## 2024-08-18 HISTORY — DX: Obstructive sleep apnea (adult) (pediatric): G47.33

## 2024-08-18 HISTORY — DX: Other cerebrovascular disease: I67.89

## 2024-08-18 HISTORY — DX: Chronic kidney disease, stage 3 unspecified: N18.30

## 2024-08-18 HISTORY — DX: Other intervertebral disc degeneration, thoracolumbar region: M51.35

## 2024-08-18 HISTORY — DX: Unspecified systolic (congestive) heart failure: I50.20

## 2024-08-18 HISTORY — PX: TOTAL KNEE REVISION: SHX996

## 2024-08-18 HISTORY — DX: Type 2 diabetes mellitus without complications: E11.9

## 2024-08-18 HISTORY — DX: Hypertensive heart disease with heart failure: I11.0

## 2024-08-18 HISTORY — DX: Other cervical disc degeneration, unspecified cervical region: M50.30

## 2024-08-18 HISTORY — DX: Dyspnea, unspecified: R06.00

## 2024-08-18 HISTORY — DX: Unspecified right bundle-branch block: I45.10

## 2024-08-18 LAB — SYNOVIAL CELL COUNT + DIFF, W/ CRYSTALS
Crystals, Fluid: NONE SEEN
Eosinophils-Synovial: 0 %
Lymphocytes-Synovial Fld: 55 %
Monocyte-Macrophage-Synovial Fluid: 10 %
Neutrophil, Synovial: 35 %
WBC, Synovial: 70 /mm3 (ref 0–200)

## 2024-08-18 LAB — GLUCOSE, CAPILLARY
Glucose-Capillary: 80 mg/dL (ref 70–99)
Glucose-Capillary: 96 mg/dL (ref 70–99)
Glucose-Capillary: 140 mg/dL — ABNORMAL HIGH (ref 70–99)
Glucose-Capillary: 181 mg/dL — ABNORMAL HIGH (ref 70–99)

## 2024-08-18 SURGERY — TOTAL KNEE REVISION
Anesthesia: General | Site: Knee | Laterality: Left

## 2024-08-18 MED ORDER — TRANEXAMIC ACID-NACL 1000-0.7 MG/100ML-% IV SOLN
INTRAVENOUS | Status: AC
  Filled 2024-08-18: qty 100

## 2024-08-18 MED ORDER — CARVEDILOL 12.5 MG PO TABS
ORAL_TABLET | ORAL | Status: AC
  Filled 2024-08-18: qty 2

## 2024-08-18 MED ORDER — FENTANYL CITRATE (PF) 100 MCG/2ML IJ SOLN
INTRAMUSCULAR | Status: DC | PRN
  Administered 2024-08-18: 50 ug via INTRAVENOUS
  Administered 2024-08-18: 25 ug via INTRAVENOUS

## 2024-08-18 MED ORDER — SODIUM CHLORIDE (PF) 0.9 % IJ SOLN
INTRAMUSCULAR | Status: DC | PRN
  Administered 2024-08-18: 70 mL via INTRAMUSCULAR

## 2024-08-18 MED ORDER — EPHEDRINE SULFATE (PRESSORS) 25 MG/5ML IV SOSY
PREFILLED_SYRINGE | INTRAVENOUS | Status: DC | PRN
  Administered 2024-08-18 (×3): 5 mg via INTRAVENOUS

## 2024-08-18 MED ORDER — PHENYLEPHRINE HCL-NACL 20-0.9 MG/250ML-% IV SOLN
INTRAVENOUS | Status: DC | PRN
  Administered 2024-08-18: 75 ug/min via INTRAVENOUS

## 2024-08-18 MED ORDER — DOCUSATE SODIUM 100 MG PO CAPS
100.0000 mg | ORAL_CAPSULE | Freq: Two times a day (BID) | ORAL | Status: DC
  Administered 2024-08-18 – 2024-08-22 (×8): 100 mg via ORAL
  Filled 2024-08-18 (×8): qty 1

## 2024-08-18 MED ORDER — 0.9 % SODIUM CHLORIDE (POUR BTL) OPTIME
TOPICAL | Status: DC | PRN
  Administered 2024-08-18: 3000 mL

## 2024-08-18 MED ORDER — INSULIN ASPART 100 UNIT/ML IJ SOLN
0.0000 [IU] | Freq: Three times a day (TID) | INTRAMUSCULAR | Status: DC
  Administered 2024-08-19 – 2024-08-21 (×3): 2 [IU] via SUBCUTANEOUS
  Administered 2024-08-22 (×2): 3 [IU] via SUBCUTANEOUS
  Filled 2024-08-18: qty 2
  Filled 2024-08-18 (×2): qty 3
  Filled 2024-08-18: qty 2
  Filled 2024-08-18: qty 3

## 2024-08-18 MED ORDER — SODIUM CHLORIDE 0.9 % IV SOLN: INTRAVENOUS | Status: DC

## 2024-08-18 MED ORDER — LOSARTAN POTASSIUM 50 MG PO TABS
100.0000 mg | ORAL_TABLET | Freq: Every day | ORAL | Status: DC
  Administered 2024-08-18: 100 mg via ORAL
  Filled 2024-08-18: qty 2

## 2024-08-18 MED ORDER — CEFAZOLIN SODIUM-DEXTROSE 3-4 GM/150ML-% IV SOLN
3.0000 g | INTRAVENOUS | Status: AC
  Administered 2024-08-18: 3 g via INTRAVENOUS
  Filled 2024-08-18: qty 150

## 2024-08-18 MED ORDER — SUGAMMADEX SODIUM 200 MG/2ML IV SOLN
INTRAVENOUS | Status: DC | PRN
  Administered 2024-08-18: 250 mg via INTRAVENOUS

## 2024-08-18 MED ORDER — PHENYLEPHRINE 80 MCG/ML (10ML) SYRINGE FOR IV PUSH (FOR BLOOD PRESSURE SUPPORT)
PREFILLED_SYRINGE | INTRAVENOUS | Status: AC
  Filled 2024-08-18: qty 10

## 2024-08-18 MED ORDER — MIDAZOLAM HCL 2 MG/2ML IJ SOLN
INTRAMUSCULAR | Status: AC
  Filled 2024-08-18: qty 2

## 2024-08-18 MED ORDER — ENOXAPARIN SODIUM 30 MG/0.3ML IJ SOSY
30.0000 mg | PREFILLED_SYRINGE | Freq: Two times a day (BID) | INTRAMUSCULAR | Status: DC
  Administered 2024-08-19 – 2024-08-22 (×7): 30 mg via SUBCUTANEOUS
  Filled 2024-08-18 (×7): qty 0.3

## 2024-08-18 MED ORDER — OXYCODONE HCL 5 MG PO TABS
5.0000 mg | ORAL_TABLET | ORAL | Status: DC | PRN
  Administered 2024-08-18: 5 mg via ORAL
  Filled 2024-08-18: qty 1

## 2024-08-18 MED ORDER — CEFAZOLIN SODIUM-DEXTROSE 2-4 GM/100ML-% IV SOLN
2.0000 g | Freq: Four times a day (QID) | INTRAVENOUS | Status: AC
  Administered 2024-08-18 (×2): 2 g via INTRAVENOUS
  Filled 2024-08-18 (×3): qty 100

## 2024-08-18 MED ORDER — PROPOFOL 10 MG/ML IV BOLUS
INTRAVENOUS | Status: AC
  Filled 2024-08-18: qty 20

## 2024-08-18 MED ORDER — LIDOCAINE HCL URETHRAL/MUCOSAL 2 % EX GEL
CUTANEOUS | Status: AC
  Filled 2024-08-18: qty 6

## 2024-08-18 MED ORDER — MIDAZOLAM HCL (PF) 2 MG/2ML IJ SOLN
INTRAMUSCULAR | Status: DC | PRN
  Administered 2024-08-18: 2 mg via INTRAVENOUS

## 2024-08-18 MED ORDER — GABAPENTIN 300 MG PO CAPS
600.0000 mg | ORAL_CAPSULE | Freq: Two times a day (BID) | ORAL | Status: DC
  Administered 2024-08-18 – 2024-08-22 (×8): 600 mg via ORAL
  Filled 2024-08-18: qty 2
  Filled 2024-08-18: qty 6
  Filled 2024-08-18: qty 2
  Filled 2024-08-18: qty 6
  Filled 2024-08-18 (×2): qty 2

## 2024-08-18 MED ORDER — ORAL CARE MOUTH RINSE: 15.0000 mL | Freq: Once | OROMUCOSAL | Status: AC

## 2024-08-18 MED ORDER — TRAMADOL HCL 50 MG PO TABS
50.0000 mg | ORAL_TABLET | Freq: Four times a day (QID) | ORAL | Status: DC | PRN
  Administered 2024-08-20: 50 mg via ORAL
  Filled 2024-08-18: qty 1

## 2024-08-18 MED ORDER — ONDANSETRON HCL 4 MG/2ML IJ SOLN: 4.0000 mg | Freq: Four times a day (QID) | INTRAMUSCULAR | Status: DC | PRN

## 2024-08-18 MED ORDER — PHENOL 1.4 % MT LIQD
1.0000 | OROMUCOSAL | Status: DC | PRN
  Filled 2024-08-18: qty 177

## 2024-08-18 MED ORDER — PROPOFOL 10 MG/ML IV BOLUS
INTRAVENOUS | Status: DC | PRN
  Administered 2024-08-18: 120 ug via INTRAVENOUS

## 2024-08-18 MED ORDER — FENTANYL CITRATE (PF) 100 MCG/2ML IJ SOLN
INTRAMUSCULAR | Status: AC
  Filled 2024-08-18: qty 2

## 2024-08-18 MED ORDER — INSULIN ASPART 100 UNIT/ML IJ SOLN: 0.0000 [IU] | Freq: Every day | INTRAMUSCULAR | Status: DC

## 2024-08-18 MED ORDER — SPIRONOLACTONE 25 MG PO TABS
25.0000 mg | ORAL_TABLET | Freq: Every day | ORAL | Status: DC
  Filled 2024-08-18: qty 1

## 2024-08-18 MED ORDER — LIDOCAINE HCL (PF) 2 % IJ SOLN
INTRAMUSCULAR | Status: DC | PRN
  Administered 2024-08-18: 100 mg via INTRADERMAL

## 2024-08-18 MED ORDER — ONDANSETRON HCL 4 MG/2ML IJ SOLN
INTRAMUSCULAR | Status: DC | PRN
  Administered 2024-08-18: 4 mg via INTRAVENOUS

## 2024-08-18 MED ORDER — FUROSEMIDE 40 MG PO TABS
40.0000 mg | ORAL_TABLET | Freq: Every day | ORAL | Status: DC
  Administered 2024-08-19 – 2024-08-22 (×3): 40 mg via ORAL
  Filled 2024-08-18 (×3): qty 2
  Filled 2024-08-18 (×2): qty 1

## 2024-08-18 MED ORDER — MENTHOL 3 MG MT LOZG
1.0000 | LOZENGE | OROMUCOSAL | Status: DC | PRN
  Filled 2024-08-18: qty 9

## 2024-08-18 MED ORDER — BUPIVACAINE LIPOSOME 1.3 % IJ SUSP
INTRAMUSCULAR | Status: AC
  Filled 2024-08-18: qty 20

## 2024-08-18 MED ORDER — TRANEXAMIC ACID-NACL 1000-0.7 MG/100ML-% IV SOLN
1000.0000 mg | INTRAVENOUS | Status: AC
  Administered 2024-08-18 (×2): 1000 mg via INTRAVENOUS

## 2024-08-18 MED ORDER — ROCURONIUM BROMIDE 100 MG/10ML IV SOLN
INTRAVENOUS | Status: DC | PRN
  Administered 2024-08-18: 50 mg via INTRAVENOUS
  Administered 2024-08-18: 30 mg via INTRAVENOUS

## 2024-08-18 MED ORDER — SODIUM CHLORIDE (PF) 0.9 % IJ SOLN
INTRAMUSCULAR | Status: AC
  Filled 2024-08-18: qty 20

## 2024-08-18 MED ORDER — ACETAMINOPHEN 10 MG/ML IV SOLN
INTRAVENOUS | Status: DC | PRN
  Administered 2024-08-18: 1000 mg via INTRAVENOUS

## 2024-08-18 MED ORDER — FENTANYL CITRATE (PF) 100 MCG/2ML IJ SOLN
25.0000 ug | INTRAMUSCULAR | Status: DC | PRN
  Administered 2024-08-18 (×3): 25 ug via INTRAVENOUS

## 2024-08-18 MED ORDER — ACETAMINOPHEN 500 MG PO TABS
1000.0000 mg | ORAL_TABLET | Freq: Three times a day (TID) | ORAL | Status: AC
  Administered 2024-08-18 – 2024-08-19 (×2): 1000 mg via ORAL
  Filled 2024-08-18 (×2): qty 2

## 2024-08-18 MED ORDER — CHLORHEXIDINE GLUCONATE 0.12 % MT SOLN
OROMUCOSAL | Status: AC
  Filled 2024-08-18: qty 15

## 2024-08-18 MED ORDER — BUPIVACAINE-EPINEPHRINE (PF) 0.25% -1:200000 IJ SOLN
INTRAMUSCULAR | Status: AC
  Filled 2024-08-18: qty 30

## 2024-08-18 MED ORDER — DULOXETINE HCL 30 MG PO CPEP
60.0000 mg | ORAL_CAPSULE | Freq: Every morning | ORAL | Status: DC
  Administered 2024-08-19 – 2024-08-22 (×4): 60 mg via ORAL
  Filled 2024-08-18: qty 2
  Filled 2024-08-18: qty 1
  Filled 2024-08-18 (×2): qty 2
  Filled 2024-08-18: qty 1
  Filled 2024-08-18: qty 2

## 2024-08-18 MED ORDER — CHLORHEXIDINE GLUCONATE 0.12 % MT SOLN
15.0000 mL | Freq: Once | OROMUCOSAL | Status: AC
  Administered 2024-08-18: 15 mL via OROMUCOSAL

## 2024-08-18 MED ORDER — METOCLOPRAMIDE HCL 5 MG PO TABS
5.0000 mg | ORAL_TABLET | Freq: Three times a day (TID) | ORAL | Status: DC | PRN
  Filled 2024-08-18: qty 2

## 2024-08-18 MED ORDER — PROPOFOL 1000 MG/100ML IV EMUL
INTRAVENOUS | Status: AC
  Filled 2024-08-18: qty 100

## 2024-08-18 MED ORDER — ACETAMINOPHEN 10 MG/ML IV SOLN
INTRAVENOUS | Status: AC
  Filled 2024-08-18: qty 100

## 2024-08-18 MED ORDER — OXYCODONE HCL 5 MG PO TABS
ORAL_TABLET | ORAL | Status: AC
  Filled 2024-08-18: qty 1

## 2024-08-18 MED ORDER — CARVEDILOL 25 MG PO TABS
25.0000 mg | ORAL_TABLET | Freq: Two times a day (BID) | ORAL | Status: DC
  Administered 2024-08-18 – 2024-08-20 (×5): 25 mg via ORAL
  Filled 2024-08-18 (×6): qty 1

## 2024-08-18 MED ORDER — EPHEDRINE 5 MG/ML INJ
INTRAVENOUS | Status: AC
  Filled 2024-08-18: qty 5

## 2024-08-18 MED ORDER — DEXAMETHASONE SOD PHOSPHATE PF 10 MG/ML IJ SOLN
8.0000 mg | Freq: Once | INTRAMUSCULAR | Status: AC
  Administered 2024-08-18: 8 mg via INTRAVENOUS

## 2024-08-18 MED ORDER — OXYCODONE HCL 5 MG PO TABS
5.0000 mg | ORAL_TABLET | Freq: Once | ORAL | Status: AC | PRN
  Administered 2024-08-18: 5 mg via ORAL

## 2024-08-18 MED ORDER — ONDANSETRON HCL 4 MG PO TABS
4.0000 mg | ORAL_TABLET | Freq: Four times a day (QID) | ORAL | Status: DC | PRN
  Filled 2024-08-18: qty 1

## 2024-08-18 MED ORDER — PHENYLEPHRINE 80 MCG/ML (10ML) SYRINGE FOR IV PUSH (FOR BLOOD PRESSURE SUPPORT)
PREFILLED_SYRINGE | INTRAVENOUS | Status: DC | PRN
  Administered 2024-08-18 (×2): 80 ug via INTRAVENOUS
  Administered 2024-08-18: 160 ug via INTRAVENOUS
  Administered 2024-08-18 (×2): 80 ug via INTRAVENOUS

## 2024-08-18 MED ORDER — OXYCODONE HCL 5 MG/5ML PO SOLN: 5.0000 mg | Freq: Once | ORAL | Status: AC | PRN

## 2024-08-18 MED ORDER — ONDANSETRON HCL 4 MG/2ML IJ SOLN
INTRAMUSCULAR | Status: AC
  Filled 2024-08-18: qty 2

## 2024-08-18 MED ORDER — PANTOPRAZOLE SODIUM 40 MG PO TBEC
40.0000 mg | DELAYED_RELEASE_TABLET | Freq: Every day | ORAL | Status: DC
  Administered 2024-08-19 – 2024-08-22 (×4): 40 mg via ORAL
  Filled 2024-08-18 (×5): qty 1

## 2024-08-18 MED ORDER — SURGIPHOR WOUND IRRIGATION SYSTEM - OPTIME: TOPICAL | Status: DC | PRN

## 2024-08-18 MED ORDER — ROSUVASTATIN CALCIUM 20 MG PO TABS
20.0000 mg | ORAL_TABLET | Freq: Every day | ORAL | Status: DC
  Administered 2024-08-18 – 2024-08-21 (×4): 20 mg via ORAL
  Filled 2024-08-18 (×2): qty 2
  Filled 2024-08-18 (×2): qty 1

## 2024-08-18 MED ORDER — PHENYLEPHRINE HCL-NACL 20-0.9 MG/250ML-% IV SOLN
INTRAVENOUS | Status: AC
  Filled 2024-08-18: qty 250

## 2024-08-18 MED ORDER — MORPHINE SULFATE (PF) 4 MG/ML IV SOLN: 0.5000 mg | INTRAVENOUS | Status: DC | PRN

## 2024-08-18 MED ORDER — ROCURONIUM BROMIDE 10 MG/ML (PF) SYRINGE
PREFILLED_SYRINGE | INTRAVENOUS | Status: AC
  Filled 2024-08-18: qty 10

## 2024-08-18 MED ORDER — METOCLOPRAMIDE HCL 5 MG/ML IJ SOLN: 5.0000 mg | Freq: Three times a day (TID) | INTRAMUSCULAR | Status: DC | PRN

## 2024-08-18 MED ORDER — ACETAMINOPHEN 325 MG PO TABS
325.0000 mg | ORAL_TABLET | Freq: Four times a day (QID) | ORAL | Status: DC | PRN
  Administered 2024-08-19 – 2024-08-20 (×3): 650 mg via ORAL
  Filled 2024-08-18 (×3): qty 2

## 2024-08-18 SURGICAL SUPPLY — 66 items
BLADE SAGITTAL WIDE XTHICK NO (BLADE) IMPLANT
BLADE SAW 90X13X1.19 OSCILLAT (BLADE) IMPLANT
BLADE SAW GIGLI 510 (BLADE) IMPLANT
BLADE SAW SAG 25X90X1.19 (BLADE) IMPLANT
BLADE SAW SAG 29X58X.64 (BLADE) IMPLANT
BLADE SW THK.38XMED LNG THN (BLADE) IMPLANT
BNDG ELASTIC 6INX 5YD STR LF (GAUZE/BANDAGES/DRESSINGS) ×1 IMPLANT
BOWL CEMENT MIX W/ADAPTER (MISCELLANEOUS) ×1 IMPLANT
BRUSH SCRUB EZ PLAIN DRY (MISCELLANEOUS) ×1 IMPLANT
BUR ACORN 7.5 PRECISION (BURR) IMPLANT
CEMENT BONE R 1X40 (Cement) IMPLANT
CHLORAPREP W/TINT 26 (MISCELLANEOUS) ×2 IMPLANT
CNTNR URN SCR LID CUP LEK RST (MISCELLANEOUS) IMPLANT
COOLER ICEMAN CLASSIC (MISCELLANEOUS) ×1 IMPLANT
CUFF TRNQT CYL 24X4X16.5-23 (TOURNIQUET CUFF) IMPLANT
CUFF TRNQT CYL 30X4X21-28X (TOURNIQUET CUFF) IMPLANT
DERMABOND ADVANCED .7 DNX12 (GAUZE/BANDAGES/DRESSINGS) IMPLANT
DRAPE SHEET LG 3/4 BI-LAMINATE (DRAPES) ×2 IMPLANT
DRAPE XRAY CASSETTE 23X24 (DRAPES) IMPLANT
DRSG MEPILEX SACRM 8.7X9.8 (GAUZE/BANDAGES/DRESSINGS) ×1 IMPLANT
DRSG OPSITE POSTOP 4X10 (GAUZE/BANDAGES/DRESSINGS) IMPLANT
DRSG OPSITE POSTOP 4X8 (GAUZE/BANDAGES/DRESSINGS) IMPLANT
ELECTRODE REM PT RTRN 9FT ADLT (ELECTROSURGICAL) ×1 IMPLANT
ETHIBOND 2 0 GREEN CT 2 30IN (SUTURE) IMPLANT
EVACUATOR 1/8 PVC DRAIN (DRAIN) IMPLANT
GLOVE BIO SURGEON STRL SZ8 (GLOVE) ×1 IMPLANT
GLOVE BIOGEL PI IND STRL 8 (GLOVE) ×1 IMPLANT
GLOVE PI ORTHO PRO STRL 7.5 (GLOVE) ×2 IMPLANT
GLOVE PI ORTHO PRO STRL SZ8 (GLOVE) ×2 IMPLANT
GLOVE SURG SYN 7.5 PF PI (GLOVE) ×1 IMPLANT
GOWN SRG XL LONG LVL 3 NONREIN (GOWNS) ×1 IMPLANT
GOWN SRG XL LVL 3 NONREINFORCE (GOWNS) ×1 IMPLANT
GOWN STRL REUS W/ TWL LRG LVL3 (GOWN DISPOSABLE) ×1 IMPLANT
HOLDER FOLEY CATH W/STRAP (MISCELLANEOUS) ×1 IMPLANT
HOOD PEEL AWAY T7 (MISCELLANEOUS) ×2 IMPLANT
KIT TURNOVER KIT A (KITS) ×1 IMPLANT
MANIFOLD NEPTUNE II (INSTRUMENTS) ×1 IMPLANT
MARKER SKIN DUAL TIP RULER LAB (MISCELLANEOUS) ×1 IMPLANT
MAT ABSORB FLUID 56X50 GRAY (MISCELLANEOUS) ×1 IMPLANT
NDL SAFETY ECLIP 18X1.5 (MISCELLANEOUS) IMPLANT
NEEDLE HYPO 21X1.5 SAFETY (NEEDLE) IMPLANT
NS IRRIG 500ML POUR BTL (IV SOLUTION) ×1 IMPLANT
PACK TOTAL KNEE (MISCELLANEOUS) ×1 IMPLANT
PAD ARMBOARD POSITIONER FOAM (MISCELLANEOUS) ×3 IMPLANT
PAD COLD UNI WRAP-ON (PAD) ×1 IMPLANT
PENCIL SMOKE EVACUATOR (MISCELLANEOUS) ×1 IMPLANT
PLATE ROT INSERT 15MM SIZE 4 (Plate) IMPLANT
SOL .9 NS 3000ML IRR UROMATIC (IV SOLUTION) ×1 IMPLANT
SOLN STERILE WATER BTL 1000 ML (IV SOLUTION) ×1 IMPLANT
SOLUTION IRRIG SURGIPHOR (IV SOLUTION) ×1 IMPLANT
STEM POLY PAT PLY 38M KNEE (Knees) IMPLANT
STOCKINETTE IMPERV 14X48 (MISCELLANEOUS) ×1 IMPLANT
SUT ETHIBOND 2 V 37 (SUTURE) IMPLANT
SUT STRATAFIX 14 PDO 36 VLT (SUTURE) IMPLANT
SUT TICRON 2-0 30IN 311381 (SUTURE) IMPLANT
SUT VIC AB 0 CT1 36 (SUTURE) IMPLANT
SUT VIC AB 1 CT1 36 (SUTURE) IMPLANT
SUT VIC AB 2-0 CT2 27 (SUTURE) IMPLANT
SUTURE STRATA SPIR 4-0 18 (SUTURE) IMPLANT
SWAB CULTURE AMIES ANAERIB BLU (MISCELLANEOUS) IMPLANT
SYR 20ML LL LF (SYRINGE) IMPLANT
TAPE CLOTH 3X10 WHT NS LF (GAUZE/BANDAGES/DRESSINGS) ×1 IMPLANT
TIP FAN IRRIG PULSAVAC PLUS (DISPOSABLE) ×1 IMPLANT
TOWEL OR 17X26 4PK STRL BLUE (TOWEL DISPOSABLE) IMPLANT
TRAP FLUID SMOKE EVACUATOR (MISCELLANEOUS) ×1 IMPLANT
TRAY FOLEY SLVR 16FR LF STAT (SET/KITS/TRAYS/PACK) ×1 IMPLANT

## 2024-08-18 NOTE — Interval H&P Note (Signed)
 Patient history and physical updated. Consent reviewed including risks, benefits, and alternatives to surgery. Patient agrees with above plan to proceed with left knee 1 component revision with patellar resurfacing and polyethylene exchange.  We discussed the possibility of encountering infection during surgery and potential plan for antibiotic treatment if that is encountered.  Low clinical suspicion at this time for any infection of the knee and we will send fluid and tissue from the case.

## 2024-08-18 NOTE — H&P (Signed)
 HPI:  Diana Green is a 73 y.o. female who presents for history and physical for left total knee revision of polyethylene component and patella component. Date of surgery 08/18/2024. Patient underwent left total knee replacement at outside facility, had been doing well but over the last few months has been having increasing instability and pain with flexion. She reports slight anterior knee pain. Pain can be 5 out of 10. She underwent a bone scan showing loosening of the patella component as well as normal uptake around the tibial and femoral component. She gets relief with a knee hinged brace but still has pain and discomfort along the patellar region. She has seen Dr. Lorelle discussed bone scan findings, x-ray findings and agreed and consented to a left total knee revision of patella component and polyethylene component.  Current Outpatient Medications  Medication Sig Dispense Refill  acetaminophen  (TYLENOL ) 500 mg capsule Take 500-1,000 mg by mouth every 6 (six) hours  CALCIUM  ORAL Take 1 caplet by mouth once daily  carvediloL  (COREG ) 25 MG tablet Take 25 mg by mouth 2 (two) times daily  celecoxib  (CELEBREX ) 200 MG capsule 1 capsule with food Orally Twice a day; Duration: 90  clotrimazole -betamethasone  (LOTRISONE ) 1-0.05 % cream Apply topically 2 (two) times daily  diphenhydrAMINE  (BENADRYL ) 25 mg capsule Take 25 mg by mouth every 6 (six) hours as needed for Itching  DULoxetine  (CYMBALTA ) 60 MG DR capsule Take 60 mg by mouth once daily  empagliflozin  (JARDIANCE ) 10 mg tablet Take 1 tablet (10 mg total) by mouth once daily 90 tablet 3  gabapentin  (NEURONTIN ) 600 MG tablet Take 600 mg by mouth 2 (two) times daily  glimepiride  (AMARYL ) 2 MG tablet Take 2 mg by mouth daily with breakfast  lidocaine  (SALONPAS) 4 % patch Place 1 patch onto the skin daily Apply patch to the most painful area for upt to 12 hours in a 24 hours period.  losartan  (COZAAR ) 100 MG tablet Take 100 mg by mouth once daily.   miscellaneous medical supply Misc  polyethylene glycol (MIRALAX ) packet Take 17 g by mouth once daily Mix in 4-8ounces of fluid prior to taking.  rosuvastatin  (CRESTOR ) 20 MG tablet Take 20 mg by mouth once daily  sennosides-docusate (SENOKOT-S) 8.6-50 mg tablet Take 1 tablet by mouth 2 (two) times daily  spironolactone  (ALDACTONE ) 25 MG tablet Take 25 mg by mouth once daily   No current facility-administered medications for this visit.   No Known Allergies Past Medical History:  Diagnosis Date  Arthritis  Depression  Hyperlipidemia  Hypertension   Past Surgical History:  Procedure Laterality Date  CESAREAN SECTION 08/21/1969  TUBAL LIGATION 08/21/1973  C3-4 ANTERIOR CERVICAL DECOMPRESSION/DISCECTOMY FUSION WITH HEDRON 07/08/2021  Dr. Reeves Daisy at [facility], [place]  OPEN T11-12 POSTERIOR DECOMPRESSION AND FUSION 09/19/2021  Dr. Reeves Daisy at [facility], [place]  ARTHROPLASTY TOTAL KNEE Right 08/29/2022  By Dr. Lorelle  [email or web address]  Bilateral 07/23/2023  IntHem/Diverticulosis/NoRpt/TKT  CHOLECYSTECTOMY  Left total knee arthroplasty using computer-assisted navigation 06/09/13  right rotator cuff surgery, 2006   Family History  Problem Relation Name Age of Onset  High blood pressure (Hypertension) Mother  Myocardial Infarction (Heart attack) Mother   Social History   Socioeconomic History  Marital status: Widowed  Spouse name: Lamar  Number of children: 2  Years of education: 12  Occupational History  Occupation: Retired  Tobacco Use  Smoking status: Never  Passive exposure: Never  Smokeless tobacco: Never  Vaping Use  Vaping status: Never Used  Substance and Sexual Activity  Alcohol use: Yes  Alcohol/week: 0.0 standard drinks of alcohol  Comment: drinks wine occasionally  Drug use: No  Sexual activity: Not Currently  Partners: Male  Birth control/protection: Surgical  Social History Narrative  She drinks one cup of tea/coffee per day. She is married with  2 children.   Social Drivers of Health   Financial Resource Strain: High Risk (08/01/2024)  Overall Financial Resource Strain (CARDIA)  Difficulty of Paying Living Expenses: Hard  Food Insecurity: Food Insecurity Present (08/01/2024)  Hunger Vital Sign  Worried About Running Out of Food in the Last Year: Sometimes true  Ran Out of Food in the Last Year: Often true  Transportation Needs: No Transportation Needs (08/01/2024)  PRAPARE - Risk Analyst (Medical): No  Lack of Transportation (Non-Medical): No   Review of Systems:  A comprehensive 14 point ROS was performed, reviewed, and the pertinent orthopaedic findings are documented in the HPI.  Exam: Vitals:  08/01/24 1057  BP: 118/80  Weight: (!) 118.8 kg (262 lb)  Height: 175.3 cm (5' 9)  PainSc: 5  PainLoc: Knee   General:  Well developed, well nourished, no apparent distress, normal affect, normal gait with no antalgic component.   HEENT: Head normocephalic, atraumatic, PERRL.   Abdomen: Soft, non tender, non distended, Bowel sounds present.  Heart: Examination of the heart reveals regular, rate, and rhythm. There is no murmur noted on ascultation. There is a normal apical pulse.  Lungs: Lungs are clear to auscultation. There is no wheeze, rhonchi, or crackles. There is normal expansion of bilateral chest walls.   Comprehensive Knee Exam: Gait Non-antalgic and fluid  Alignment Neutral   Inspection Right Left  Skin Normal appearance with no obvious deformity. No ecchymosis or erythema. Healed midline incision Normal appearance with no obvious deformity. No ecchymosis or erythema. Healed midline incision  Soft Tissue No focal soft tissue swelling No focal soft tissue swelling  Quad Atrophy None Medial quadricep atrophy   Palpation  Right Left  Tenderness No peripatellar, patellar tendon, quad tendon, medial/lateral joint line pain Patellar tenderness medial and lateral which  reproduces her pain  Crepitus No patellofemoral or tibiofemoral crepitus No patellofemoral or tibiofemoral crepitus  Effusion None None   Range of Motion Right Left  Flexion 0-120 0-110  Extension Full knee extension without hyperextension 5 degrees of hyperextension noted today passively no active lag   Ligamentous Exam Right Left  AP motion in flexion Normal 5-68mm  Varus/Valgus 0 Normal 2-63mm  Varus/Valgus 90 Normal 2-34mm    Neurovascular Right Left  Quadriceps Strength 5/5 5/5  Hamstring Strength 5/5 5/5  Hip Abductor Strength 4/5 4/5  Distal Motor Normal Normal  Distal Sensory Normal light touch sensation Normal light touch sensation  Distal Pulses Normal Normal   Imaging Studies: Left knee x-rays reviewed by me today show patient status post cemented total knee arthroplasty with patellar resurfacing. The patella shows some degenerative changes and that it is eroding over the lateral femoral condyle with bony remodeling. There is also some questionable osteolysis behind the femoral component. No fractures or dislocations noted.  Narrative  EXAM: THREE PHASE BONE SCAN 06/09/2024 03:50:00 PM  CLINICAL HISTORY: Pain due to total left knee replacement, initial encounter. Left knee replacement 6 years ago. In past 6 months, there has been a steady increase in pain. No fall/trauma.  COMPARISON: None.  TECHNIQUE:  RADIOPHARMACEUTICAL: 21.31 mCi Tc-31m MDP. Dynamic planar blood flow images are obtained followed by blood pool or soft tissue phase planar  images. Delayed phase planar images are also obtained.  Images are obtained of the knees.  FINDINGS:  Blood flow images show no increased blood flow to the left or right knee. Blood pool phase demonstrates no abnormal radiotracer activity in the left or right knee.  Delayed phase activity demonstrates moderate activity associated with the left patella. No abnormal activity is associated with the left or right  tibial plateau or femoral condyles. SPECT CT imaging corresponds increased activity to the lateral aspect of the left patella.  IMPRESSION: 1. Moderate radiotracer activity at the lateral aspect of the left patella may reflect a focal patellar process such as stress-related change. 2. No evidence of loosening or infection of the femoral condyles or tibial components.  Electronically signed by: Norleen Boxer MD 06/11/2024 04:30 PM EDT RP Workstation: [id number]  ESR 33 CRP 1.55 A1c 6.4  Assessment: Painful left total knee replacement, left patella component loosening, mild global instability left knee  Plan: Nuri is a 73 year old female with a history of a left total knee arthroplasty who developed knee instability and loosening of the patella component. Tibial and femoral components appear to be well fixated. She has had anterior knee pain and instability that is interfering with her quality of life and activities daily living. Risks, benefits, complications of a left total knee revision have been discussed with the patient. Patient has agreed and consented procedure with Dr. Lorelle on 08/18/2024.   The hospitalization and post-operative care and rehabilitation were also discussed. The use of perioperative antibiotics and DVT prophylaxis were discussed. The risk, benefits and alternatives to a surgical intervention were discussed at length with the patient. The patient was also advised of risks related to the medical comorbidities and elevated body mass index (BMI). A lengthy discussion took place to review the most common complications including but not limited to: stiffness, loss of function, complex regional pain syndrome, deep vein thrombosis, pulmonary embolus, heart attack, stroke, infection, wound breakdown, numbness, intraoperative fracture, damage to nerves, tendon,muscles, arteries or other blood vessels, death and other possible complications from anesthesia. The patient was told  that we will take steps to minimize these risks by using sterile technique, antibiotics and DVT prophylaxis when appropriate and follow the patient postoperatively in the office setting to monitor progress. The possibility of recurrent pain, no improvement in pain and actual worsening of pain were also discussed with the patient.   Current Implants DePuy PFC Sigma size 4 posterior stabilized femoral component cemented, size 4 MBT tibial component (cemented), 41 mm 3 peg oval dome patella cemented, 10 mm stabilized rotating platform polyethylene insert.

## 2024-08-18 NOTE — TOC Initial Note (Signed)
 Transition of Care [facility]) - Initial/Assessment Note    Patient Details  Name: Diana Green MRN: [id number] Date of Birth: 1951-06-18  Transition of Care [facility]) CM/SW Contact:    Diana Jackquline RAMAN, RN Phone Number: 08/18/2024, 4:21 PM  Clinical Narrative:        1540: RNCM received a message from MD via secure chat informing me that the patient is going to a SNF for STR.   1600: RNCM  met with patient at bedside. RNCM Introduced myself and my role and explained that discharge planning would be discussed. MD is recommending STR. Patient is in agreement with STR said that her first choice is [facility] and her second choice is [facility]. Patient lives alone and was totally independent prior to surgery. Patient drives herself to her appointments and uses [facility] on [address]. States she had a walker but it broke and has a BSC and Paediatric Nurse. SNF search pending PT/OT Eval.  RNCM will continue to follow for discharge planning needs.           Expected Discharge Plan: Skilled Nursing Facility Barriers to Discharge: Continued Medical Work up   Patient Goals and CMS Choice            Expected Discharge Plan and Services       Living arrangements for the past 2 months: Single Family Home                                      Prior Living Arrangements/Services Living arrangements for the past 2 months: Single Family Home Lives with:: Self Patient language and need for interpreter reviewed:: Yes Do you feel safe going back to the place where you live?: Yes      Need for Family Participation in Patient Care: Yes (Comment) Care giver support system in place?: Yes (comment) Current home services: DME Criminal Activity/Legal Involvement Pertinent to Current Situation/Hospitalization: No - Comment as needed  Activities of Daily Living   ADL Screening (condition at time of admission) Independently performs ADLs?: Yes (appropriate for developmental  age) Is the patient deaf or have difficulty hearing?: No Does the patient have difficulty seeing, even when wearing glasses/contacts?: No Does the patient have difficulty concentrating, remembering, or making decisions?: No  Permission Sought/Granted                  Emotional Assessment Appearance:: Appears stated age, Well-Groomed Attitude/Demeanor/Rapport: Gracious, Engaged, Self-Confident Affect (typically observed): Accepting, Calm, Pleasant, Quiet Orientation: : Oriented to Self, Oriented to Place, Oriented to  Time, Oriented to Situation Alcohol / Substance Use: Not Applicable Psych Involvement: No (comment)  Admission diagnosis:  Pain due to total left knee replacement, initial encounter [U15.15KJ, Z96.652] S/P revision of total knee, left [Z96.652] Patient Active Problem List   Diagnosis Date Noted   S/P revision of total knee, left 08/18/2024   Class 2 severe obesity due to excess calories with serious comorbidity and body mass index (BMI) of 39.0 to 39.9 in adult 08/07/2024   Seasonal allergic rhinitis 08/07/2024   Medicare annual wellness visit, subsequent 08/07/2024   OSA (obstructive sleep apnea) 07/10/2024   CKD stage 3b, GFR 30-44 ml/min (HCC) 06/23/2024   Breast cancer screening by mammogram 06/20/2024   Fatigue 06/20/2024   Diarrhea 06/20/2024   Needs flu shot 06/20/2024   CHF (congestive heart failure), NYHA class I, acute, systolic (HCC) 06/20/2024  Hypertension associated with diabetes (HCC) 05/09/2024   Morbid obesity with BMI of 40.0-44.9, adult (HCC) 05/09/2024   Abnormal EKG 04/14/2024   Ankle edema, bilateral 04/14/2024   Diabetes mellitus due to underlying condition with hyperosmolarity without coma, without long-term current use of insulin  (HCC) 04/14/2024   Combined hyperlipidemia associated with type 2 diabetes mellitus (HCC) 04/14/2024   Acute URI 01/08/2024   GAD (generalized anxiety disorder) 12/03/2023   Gastroesophageal reflux disease  without esophagitis 07/26/2023   Symptom of blood in stool 02/19/2023   Prediabetes 01/08/2023   Arthritis 12/08/2022   Dehydration 12/08/2022   Mixed hyperlipidemia 10/09/2022   Essential hypertension, benign 10/09/2022   Vitamin D  deficiency 10/09/2022   Impaired glucose tolerance 10/09/2022   Iron deficiency anemia 10/09/2022   S/P TKR (total knee replacement) using cement, right 09/11/2022   Thoracic myelopathy 09/19/2021   Cervical myelopathy (HCC) 07/08/2021   PCP:  Fernand Fredy RAMAN, MD Pharmacy:   [facility] [address]), [place] - [address] [place] ([place]) [place] [address] Phone: [phone] Fax: [phone]  [facility] - [place], [place] - [address] [place] [address] Phone: [phone] Fax: [phone]     Social Drivers of Health (SDOH) Social History: SDOH Screenings   Food Insecurity: No Food Insecurity (08/18/2024)  Recent Concern: Food Insecurity - Food Insecurity Present (08/01/2024)   Received from [facility]  Housing: Low Risk (08/18/2024)  Transportation Needs: No Transportation Needs (08/18/2024)  Utilities: Not At Risk (08/18/2024)  Depression (PHQ2-9): Low Risk (08/07/2024)  Financial Resource Strain: High Risk (08/01/2024)   Received from [facility]  Social Connections: Moderately Integrated (08/18/2024)  Tobacco Use: Low Risk (08/18/2024)   SDOH Interventions:     Readmission Risk Interventions     No data to display

## 2024-08-18 NOTE — Anesthesia Preprocedure Evaluation (Addendum)
 "                                  Anesthesia Evaluation  Patient identified by MRN, date of birth, ID band Patient awake    Reviewed: Allergy & Precautions, NPO status , Patient's Chart, lab work & pertinent test results  History of Anesthesia Complications Negative for: history of anesthetic complications  Airway Mallampati: III  TM Distance: >3 FB Neck ROM: full    Dental  (+) Upper Dentures, Partial Lower   Pulmonary shortness of breath, sleep apnea    Pulmonary exam normal        Cardiovascular hypertension, On Medications +CHF  Normal cardiovascular exam  Most recent TTE was performed on 05/16/2024 revealing moderately reduced left ventricular systolic function with an EF of 30-35%.  There was mild to moderate LVH observed.  There were no regional wall motion abnormalities. Left ventricular diastolic Doppler parameters consistent with abnormal relaxation (G1DD).  Right ventricular size and function normal with a TAPSE measuring 2.23 cm.  Left atrium moderately enlarged.  There was trivial to mild pulmonary, tricuspid, and mitral valve regurgitation.  RVSP normal.  Normal gradients; no valvular stenosis.  Aorta normal in size with no evidence of aneurysmal dilatation.   Myocardial perfusion imaging study was performed on 05/29/2024 revealing a normal left ventricular systolic function with calculated EF of 57%.  There was a mild reversible septal defect corresponding with a possible small area of ischemia in the LAD territory.  Per cardiology, this patient is optimized for surgery and may proceed with the planned procedural course with a ACCEPTABLE risk of significant perioperative cardiovascular complications    Neuro/Psych  PSYCHIATRIC DISORDERS Anxiety Depression    negative neurological ROS     GI/Hepatic Neg liver ROS,GERD  Medicated,,  Endo/Other  diabetes  Class 3 obesity  Renal/GU CRFRenal disease     Musculoskeletal   Abdominal   Peds   Hematology  (+) Blood dyscrasia, anemia   Anesthesia Other Findings Past Medical History: No date: And by talking to you go No date: Arthritis No date: Cardiomyopathy due to hypertension, with heart failure (HCC) No date: CKD (chronic kidney disease), stage III (HCC) No date: DDD (degenerative disc disease), cervical     Comment:  a.) s/p ACDF C3-C4 07/08/2021 No date: DDD (degenerative disc disease), thoracolumbar No date: Depression No date: Dyspnea No date: GERD (gastroesophageal reflux disease) No date: HFrEF (heart failure with reduced ejection fraction) (HCC) No date: High cholesterol No date: Hypertension 10/09/2022: Iron deficiency anemia No date: OSA on CPAP No date: RBBB (right bundle branch block) No date: T2DM (type 2 diabetes mellitus) (HCC) 10/09/2022: Vitamin D  deficiency  Past Surgical History: 07/08/2021: ANTERIOR CERVICAL DECOMP/DISCECTOMY FUSION; N/A     Comment:  Procedure: C3-4 ANTERIOR CERVICAL               DECOMPRESSION/DISCECTOMY FUSION 1 LEVEL WITH HEDRON;                Surgeon: Clois Fret, MD;  Location: [facility] ORS;                Service: Neurosurgery;  Laterality: N/A; 09/19/2021: APPLICATION OF INTRAOPERATIVE CT SCAN; N/A     Comment:  Procedure: APPLICATION OF INTRAOPERATIVE CT SCAN;                Surgeon: Clois Fret, MD;  Location: [facility] ORS;  Service: Neurosurgery;  Laterality: N/A; 09/19/2021: APPLICATION OF WOUND VAC     Comment:  Procedure: APPLICATION OF WOUND VAC;  Surgeon:               Clois Fret, MD;  Location: [facility] ORS;  Service:               Neurosurgery;;  Prevena No date: BACK SURGERY     Comment:  thoracic 1971: CESAREAN SECTION No date: CHOLECYSTECTOMY No date: COLONOSCOPY 2006: ROTATOR CUFF REPAIR; Right 2014: TOTAL KNEE ARTHROPLASTY; Left 09/11/2022: TOTAL KNEE ARTHROPLASTY; Right     Comment:  Procedure: TOTAL KNEE ARTHROPLASTY;  Surgeon: Lorelle Hussar, MD;  Location:  [facility] ORS;  Service: Orthopedics;               Laterality: Right; 1975: TUBAL LIGATION     Reproductive/Obstetrics negative OB ROS                              Anesthesia Physical Anesthesia Plan  ASA: 3  Anesthesia Plan: General ETT   Post-op Pain Management: Toradol  IV (intra-op)*, Ofirmev  IV (intra-op)* and Dilaudid  IV   Induction: Intravenous  PONV Risk Score and Plan: 3 and Ondansetron , Dexamethasone  and Treatment may vary due to age or medical condition  Airway Management Planned: Oral ETT  Additional Equipment:   Intra-op Plan:   Post-operative Plan: Extubation in OR  Informed Consent: I have reviewed the patients History and Physical, chart, labs and discussed the procedure including the risks, benefits and alternatives for the proposed anesthesia with the patient or authorized representative who has indicated his/her understanding and acceptance.     Dental Advisory Given  Plan Discussed with: Anesthesiologist, CRNA and Surgeon  Anesthesia Plan Comments: (Patient consented for risks of anesthesia including but not limited to:  - adverse reactions to medications - damage to eyes, teeth, lips or other oral mucosa - nerve damage due to positioning  - sore throat or hoarseness - Damage to heart, brain, nerves, lungs, other parts of body or loss of life  Patient voiced understanding and assent.)         Anesthesia Quick Evaluation  "

## 2024-08-18 NOTE — Op Note (Signed)
 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, left knee instability  Post-Operative Diagnosis: (same)  Procedure: Left knee 1 component revision with patella resurfacing and polyethylene exchange  Components/Implants: Femur unchanged   tibia unchanged Poly 15 mm size 4 rotating platform, stabilized DePuy patella 38 mm X9.5 mm symmetric cemented  Date of Surgery: 08/15/2024  Surgeon: Arthea Sheer MD  Assistant: Debby Amber PA (present and scrubbed throughout the case, critical for assistance with exposure, retraction, instrumentation, and closure)   Anesthesiologist: Chesley  Anesthesia:  General   Tourniquet Time: 62 min  EBL: 50  IVF: 800  Complications: None Brief history: Patient is a 63 old female with a history of left total knee replacement over 10 years ago.  She had increasing pain and ongoing instability sensations with walking.  Workup found loosening of her patellar component with instability on exam.  Laboratory workup was reassuring for no signs of infection.  Despite multiple attempts at conservative management the patient had increasing pain and functional disability .   The risks and benefits of patellar revision and polyethylene exchange to increase stability as definitive surgical treatment were discussed with the patient, who opted to proceed with the operation.  All preoperative films were reviewed and an appropriate surgical plan was made prior to surgery. Preoperative range of motion was 0 to 110 5 degrees of passive hyperextension. The patient was identified as having a neutral alignment.    Description of procedure: The patient was brought to the operating room where laterality was confirmed by all those present to be the left side.    Spinal anesthesia was administered and the patient received an intravenous dose of antibiotics for surgical prophylaxis and a dose of  tranexamic acid .  Patient is positioned supine on the operating room table with all bony prominences well-padded.  A well-padded tourniquet was applied to the left thigh.  The knee was then prepped and draped in usual sterile fashion with multiple layers of adhesive and nonadhesive drapes.  All of those present in the operating room participated in a surgical timeout laterality and patient were confirmed.     An Esmarch was wrapped around the extremity and the leg was elevated and the knee flexed.  The tourniquet was inflated to a pressure of 250 mmHg. The Esmarch was removed and the leg was brought down to full extension.  The patella and tibial tubercle identified as well as the previous surgical incision and outlined using a marking pen and a full-thickness incision was made on the midline in continuation with the prior incision with a knife carried through the subcutaneous tissue down to the extensor retinaculum.  Upon exposing the retinaculum the patellar component was found to have dislodged superiorly and medially and was freely eroding through the medial retinaculum at the border of the medial quad insertion on the patella.  An arthrotomy was made medially in line with the prior arthrotomy coming circumferentially around the retinacular defect and extending up superiorly and into the medial border of the quadriceps tendon.  The quadricep tendon was found to be attached to the superior pole the patella with more than two thirds of the tendon still intact.   A standard medial release was performed over the proximal tibia exposing the existing tibial baseplate and polyethylene.  The knee was brought into extension in order to excise the scar tissue around the patellar tendon with care taken to avoid injuring the patellar tendon .  There was no synovial inflammation noted a sample of synovial fluid and synovium were sent to the lab for evaluation.  There were no signs of inflammation under frozen section  analysis of the synovium.  The interface between the femoral and tibial components and the bone appeared normal with no evidence of any loosening or motion of the components.  The patella button was then removed with minimal difficulty as it was grossly loose.    Attention was then turned to the old polyethylene and the old polyethylene was removed with a osteotome without any damage to the tibial component or femoral components.  Scar tissue was removed around the medial lateral and posterior knee.  A trial tibial polyethylene was placed and we are found to get stability at 15 mm of size.  The knee was irrigated and a real 15 mm polyethylene was implanted.  Attention was then turned back to the patella.  A freshen up cut was taken over the patella and the large osteophyte overgrowth laterally was carefully removed.  Care was taken to ensure that the patella tendon and quadriceps tendon remained attached in good footprints.  The patella was prepared for a new patellar component and found to fit a 38 mm patella.  A trial patella was placed and there was good tracking.  The patella bone was irrigated and dried and a new patellar component was cemented into place until full curing of the cement.  Para-articular injection was performed at this time.  There is found to be good tracking of the patella component.  Tourniquet was then dropped all to the vessels were carefully coagulated and the knee was irrigated with copious amounts of normal saline with pulsatile lavage, then surgiphor Betadine based solution.  The knee was then irrigated again with pulsatile lavage.   The arthrotomy was approximated with #1 Vicryl and closed with #2 Quill suture.  The area of the retinacular defect was able to be repaired with #2 Ethibond with direct repair from the existing remaining medial tissues.  This allowed for a watertight closure of the arthrotomy.  The knee was brought into slight flexion and the subcutaneous tissues were  closed with 0 Vicryl, 2-0 Vicryl and a running subcuticular 4-0 barbed monocryl based suture.  Skin was then glued with Dermabond.  A sterile adhesive dressing was then placed along with a sequential compression device to the calf, a Ted stocking, and a cryotherapy cuff.     Sponge, needle, and Lap counts were all correct at the end of the case.   The patient was transferred off of the operating room table to a hospital bed, good pulses were found distally on the operative side.  The patient was transferred to the recovery room in stable condition.

## 2024-08-18 NOTE — Anesthesia Procedure Notes (Addendum)
 Procedure Name: Intubation Date/Time: 08/18/2024 10:37 AM  Performed by: Birtha Cheron BRAVO, CRNAPre-anesthesia Checklist: Patient identified, Suction available, Patient being monitored, Emergency Drugs available and Timeout performed Patient Re-evaluated:Patient Re-evaluated prior to induction Oxygen Delivery Method: Circle system utilized Preoxygenation: Pre-oxygenation with 100% oxygen Induction Type: IV induction Ventilation: Mask ventilation without difficulty Laryngoscope Size: McGrath and 3 Grade View: Grade I Tube type: Oral Tube size: 7.0 mm Number of attempts: 1 Airway Equipment and Method: Stylet Placement Confirmation: ETT inserted through vocal cords under direct vision, positive ETCO2, CO2 detector and breath sounds checked- equal and bilateral Secured at: 21 cm Tube secured with: Tape Dental Injury: Teeth and Oropharynx as per pre-operative assessment

## 2024-08-18 NOTE — Evaluation (Signed)
 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 PMH includes: R TKA, HTN, and depression.  Clinical Impression  Upon entering room pt found returning to EOB from [facility] with +2 nursing assist and with some noted instability while taking steps but no L knee buckling observed.  Pt was minimally lethargic during the session but responded to min verbal stimuli and was able to provide history and follow commands well during the session.  Pt required physical assistance to come to standing and for stability/RW management while taking several effortful lateral steps towards the [facility].  Pt is at an elevated risk for falls and reported having no assistance at home upon discharge.  Pt will benefit from continued PT services upon discharge to safely address deficits listed in patient problem list for decreased caregiver assistance and eventual return to PLOF.       If plan is discharge home, recommend the following: A lot of help with walking and/or transfers;A little help with bathing/dressing/bathroom;Assistance with cooking/housework;Help with stairs or ramp for entrance;Assist for transportation   Can travel by private vehicle   No    Equipment Recommendations Other (comment) (TBD at next venue of care)  Recommendations for Other Services       Functional Status Assessment Patient has had a recent decline in their functional status and demonstrates the ability to make significant improvements in function in a reasonable and predictable amount of time.     Precautions / Restrictions Precautions Precautions: Fall Restrictions Weight Bearing Restrictions Per Provider Order: Yes LLE Weight Bearing Per Provider Order:  Weight bearing as tolerated      Mobility  Bed Mobility Overal bed mobility: Needs Assistance Bed Mobility: Sit to Supine       Sit to supine: Mod assist, +2 for physical assistance   General bed mobility comments: +2 assist needed for BLE and trunk control    Transfers Overall transfer level: Needs assistance Equipment used: Rolling walker (2 wheels) Transfers: Sit to/from Stand Sit to Stand: From elevated surface, Mod assist           General transfer comment: Mod multi-modal cues for sequencing and mod A to come to full upright standing    Ambulation/Gait Ambulation/Gait assistance: Min assist Gait Distance (Feet): 2 Feet Assistive device: Rolling walker (2 wheels) Gait Pattern/deviations: Step-to pattern, Shuffle Gait velocity: decreased     General Gait Details: Pt able to take several effortful, shuffling steps laterally towards the [facility] with min A for stability and to guide the RW  Stairs            Wheelchair Mobility     Tilt Bed    Modified Rankin (Stroke Patients Only)       Balance Overall balance assessment: Needs assistance Sitting-balance support: Bilateral upper extremity supported, Feet supported Sitting balance-Leahy Scale: Fair Sitting balance - Comments: Some posterior lean in sitting but pt able to self-correct with cues   Standing balance support: Bilateral upper extremity supported, During functional activity, Reliant on assistive device for balance Standing balance-Leahy Scale: Poor Standing balance comment: Min instability in standing requiring physical assist to correct  Pertinent Vitals/Pain Pain Assessment Pain Assessment: No/denies pain    Home Living Family/patient expects to be discharged to:: Private residence Living Arrangements: Alone Available Help at Discharge:  (No assistance at home, family lives out of town) Type of Home: House Home Access: Stairs to enter Entrance  Stairs-Rails: Right;Left (Too wide for both) Entrance Stairs-Number of Steps: 4   Home Layout: One level Home Equipment: Rollator (4 wheels);BSC/3in1 Additional Comments: Had a RW but it broke    Prior Function Prior Level of Function : Independent/Modified Independent             Mobility Comments: Mod Ind amb with a rollator, no fall history ADLs Comments: Ind with ADLs     Extremity/Trunk Assessment   Upper Extremity Assessment Upper Extremity Assessment: Defer to OT evaluation    Lower Extremity Assessment Lower Extremity Assessment: Generalized weakness;LLE deficits/detail LLE Deficits / Details: BLE ankle strength, AROM, and sensation to light touch grossly WNL LLE Sensation: WNL       Communication   Communication Communication: No apparent difficulties    Cognition Arousal: Lethargic Behavior During Therapy: WFL for tasks assessed/performed   PT - Cognitive impairments: No apparent impairments                         Following commands: Intact       Cueing Cueing Techniques: Verbal cues, Tactile cues     General Comments      Exercises Total Joint Exercises Ankle Circles/Pumps: AROM, Strengthening, Both, 10 reps Straight Leg Raises: AAROM, Strengthening, Both, 10 reps Long Arc Quad: AROM, Strengthening, Both, 10 reps Knee Flexion: AROM, Strengthening, Both, 10 reps Goniometric ROM: L knee AROM: 0-92 deg Other Exercises Other Exercises: LLE positioning education to promote L knee ext PROM Other Exercises: HEP handout provided   Assessment/Plan    PT Assessment Patient needs continued PT services  PT Problem List Decreased strength;Decreased range of motion;Decreased activity tolerance;Decreased balance;Decreased mobility;Decreased knowledge of use of DME       PT Treatment Interventions DME instruction;Gait training;Stair training;Functional mobility training;Therapeutic activities;Therapeutic exercise;Balance  training;Patient/family education    PT Goals (Current goals can be found in the Care Plan section)  Acute Rehab PT Goals Patient Stated Goal: To be stronger and be independent PT Goal Formulation: With patient Time For Goal Achievement: 08/31/24 Potential to Achieve Goals: Good    Frequency BID     Co-evaluation               AM-PAC PT 6 Clicks Mobility  Outcome Measure Help needed turning from your back to your side while in a flat bed without using bedrails?: A Lot Help needed moving from lying on your back to sitting on the side of a flat bed without using bedrails?: A Lot Help needed moving to and from a bed to a chair (including a wheelchair)?: A Lot Help needed standing up from a chair using your arms (e.g., wheelchair or bedside chair)?: A Lot Help needed to walk in hospital room?: A Lot Help needed climbing 3-5 steps with a railing? : Total 6 Click Score: 11    End of Session Equipment Utilized During Treatment: Gait belt Activity Tolerance: Patient tolerated treatment well Patient left: in bed;with call bell/phone within reach;with bed alarm set;with SCD's reapplied;Other (comment) (polar care donned to L knee) Nurse Communication: Mobility status;Weight bearing status PT Visit Diagnosis: Unsteadiness on feet (R26.81);Other abnormalities of gait and mobility (R26.89);Muscle weakness (generalized) (M62.81)  Time: 8376-8355 PT Time Calculation (min) (ACUTE ONLY): 21 min   Charges:   PT Evaluation $PT Eval Moderate Complexity: 1 Mod   PT General Charges $$ ACUTE PT VISIT: 1 Visit    D. Glendia Bertin PT, DPT 08/18/2024, 5:15 PM

## 2024-08-18 NOTE — Transfer of Care (Signed)
 Immediate Anesthesia Transfer of Care Note  Patient: Diana Green  Procedure(s) Performed: TOTAL KNEE REVISION (Left: Knee)  Patient Location: PACU  Anesthesia Type:General  Level of Consciousness: awake and alert   Airway & Oxygen Therapy: Patient Spontanous Breathing and Patient connected to face mask oxygen  Post-op Assessment: Report given to RN and Post -op Vital signs reviewed and stable  Post vital signs: reviewed stable  Last Vitals:  Vitals Value Taken Time  BP 130/80 08/18/24 12:54  Temp    Pulse 60 08/18/24 12:55  Resp 16 08/18/24 12:55  SpO2 100 % 08/18/24 12:55  Vitals shown include unfiled device data.  Last Pain:  Vitals:   08/18/24 0857  TempSrc: Temporal         Complications: There were no known notable events for this encounter.

## 2024-08-18 NOTE — Plan of Care (Signed)
" °  Problem: Activity: Goal: Ability to avoid complications of mobility impairment will improve Outcome: Progressing Goal: Range of joint motion will improve Outcome: Progressing   Problem: Clinical Measurements: Goal: Postoperative complications will be avoided or minimized Outcome: Progressing   Problem: Pain Management: Goal: Pain level will decrease with appropriate interventions Outcome: Progressing   Problem: Education: Goal: Knowledge of the prescribed therapeutic regimen will improve Outcome: Progressing   Problem: Activity: Goal: Ability to avoid complications of mobility impairment will improve Outcome: Progressing Goal: Range of joint motion will improve Outcome: Progressing   "

## 2024-08-19 ENCOUNTER — Encounter: Payer: Self-pay | Admitting: Orthopedic Surgery

## 2024-08-19 LAB — CBC
HCT: 33.2 % — ABNORMAL LOW (ref 36.0–46.0)
Hemoglobin: 10.6 g/dL — ABNORMAL LOW (ref 12.0–15.0)
MCH: 29.5 pg (ref 26.0–34.0)
MCHC: 31.9 g/dL (ref 30.0–36.0)
MCV: 92.5 fL (ref 80.0–100.0)
Platelets: 208 K/uL (ref 150–400)
RBC: 3.59 MIL/uL — ABNORMAL LOW (ref 3.87–5.11)
RDW: 13.3 % (ref 11.5–15.5)
WBC: 11.3 K/uL — ABNORMAL HIGH (ref 4.0–10.5)
nRBC: 0 % (ref 0.0–0.2)

## 2024-08-19 LAB — BASIC METABOLIC PANEL WITH GFR
Anion gap: 7 (ref 5–15)
BUN: 19 mg/dL (ref 8–23)
CO2: 28 mmol/L (ref 22–32)
Calcium: 9.9 mg/dL (ref 8.9–10.3)
Chloride: 104 mmol/L (ref 98–111)
Creatinine, Ser: 1.35 mg/dL — ABNORMAL HIGH (ref 0.44–1.00)
GFR, Estimated: 41 mL/min — ABNORMAL LOW
Glucose, Bld: 172 mg/dL — ABNORMAL HIGH (ref 70–99)
Potassium: 4.8 mmol/L (ref 3.5–5.1)
Sodium: 139 mmol/L (ref 135–145)

## 2024-08-19 LAB — RESP PANEL BY RT-PCR (RSV, FLU A&B, COVID)  RVPGX2
Influenza A by PCR: POSITIVE — AB
Influenza B by PCR: NEGATIVE
Resp Syncytial Virus by PCR: NEGATIVE
SARS Coronavirus 2 by RT PCR: NEGATIVE

## 2024-08-19 LAB — GLUCOSE, CAPILLARY
Glucose-Capillary: 141 mg/dL — ABNORMAL HIGH (ref 70–99)
Glucose-Capillary: 79 mg/dL (ref 70–99)
Glucose-Capillary: 110 mg/dL — ABNORMAL HIGH (ref 70–99)

## 2024-08-19 MED ORDER — ALUM & MAG HYDROXIDE-SIMETH 200-200-20 MG/5ML PO SUSP: 30.0000 mL | ORAL | Status: DC | PRN

## 2024-08-19 MED ORDER — ALBUTEROL SULFATE (2.5 MG/3ML) 0.083% IN NEBU: 3.0000 mL | INHALATION_SOLUTION | Freq: Four times a day (QID) | RESPIRATORY_TRACT | Status: DC | PRN

## 2024-08-19 MED ORDER — LOSARTAN POTASSIUM 50 MG PO TABS
100.0000 mg | ORAL_TABLET | Freq: Every day | ORAL | Status: DC
  Administered 2024-08-20: 100 mg via ORAL
  Filled 2024-08-19: qty 2

## 2024-08-19 MED ORDER — GABAPENTIN 300 MG PO CAPS
ORAL_CAPSULE | ORAL | Status: AC
  Filled 2024-08-19: qty 2

## 2024-08-19 MED ORDER — FLUTICASONE PROPIONATE 50 MCG/ACT NA SUSP
2.0000 | Freq: Every day | NASAL | Status: DC
  Administered 2024-08-19 – 2024-08-22 (×3): 2 via NASAL
  Filled 2024-08-19 (×2): qty 16

## 2024-08-19 MED ORDER — SODIUM CHLORIDE 0.9 % IV BOLUS
500.0000 mL | Freq: Once | INTRAVENOUS | Status: AC
  Administered 2024-08-19: 500 mL via INTRAVENOUS

## 2024-08-19 MED ORDER — SPIRONOLACTONE 25 MG PO TABS
25.0000 mg | ORAL_TABLET | Freq: Every day | ORAL | Status: DC
  Administered 2024-08-20 – 2024-08-22 (×2): 25 mg via ORAL
  Filled 2024-08-19 (×3): qty 1

## 2024-08-19 NOTE — Plan of Care (Signed)
   Problem: Pain Management: Goal: Pain level will decrease with appropriate interventions Outcome: Progressing

## 2024-08-19 NOTE — Plan of Care (Signed)
" °  Problem: Education: Goal: Knowledge of the prescribed therapeutic regimen will improve Outcome: Progressing   Problem: Bowel/Gastric: Goal: Gastrointestinal status for postoperative course will improve Outcome: Progressing   Problem: Cardiac: Goal: Ability to maintain an adequate cardiac output Outcome: Progressing   Problem: Clinical Measurements: Goal: Ability to maintain clinical measurements within normal limits Outcome: Progressing   Problem: Respiratory: Goal: Will regain and/or maintain adequate ventilation Outcome: Progressing   "

## 2024-08-19 NOTE — TOC Progression Note (Signed)
 Transition of Care [facility]) - Progression Note    Patient Details  Name: Diana Green MRN: [id number] Date of Birth: 15-Apr-1951  Transition of Care [facility] CM/SW Contact  Racheal LITTIE Schimke, RN Phone Number: 08/19/2024, 4:19 PM  Clinical Narrative:  Patient primary SNF preference is [facility], spoke with Therisa who agrees to review referral and decide.     Expected Discharge Plan: Skilled Nursing Facility Barriers to Discharge: Continued Medical Work up               Expected Discharge Plan and Services       Living arrangements for the past 2 months: Single Family Home                                       Social Drivers of Health (SDOH) Interventions SDOH Screenings   Food Insecurity: No Food Insecurity (08/18/2024)  Recent Concern: Food Insecurity - Food Insecurity Present (08/01/2024)   Received from [facility]  Housing: Low Risk (08/18/2024)  Transportation Needs: No Transportation Needs (08/18/2024)  Utilities: Not At Risk (08/18/2024)  Depression (PHQ2-9): Low Risk (08/07/2024)  Financial Resource Strain: High Risk (08/01/2024)   Received from [facility]  Social Connections: Moderately Integrated (08/18/2024)  Tobacco Use: Low Risk (08/18/2024)    Readmission Risk Interventions     No data to display

## 2024-08-19 NOTE — Anesthesia Postprocedure Evaluation (Signed)
"   Anesthesia Post Note  Patient: Diana Green  Procedure(s) Performed: TOTAL KNEE REVISION (Left: Knee)  Patient location during evaluation: PACU Anesthesia Type: General Level of consciousness: awake and alert Pain management: pain level controlled Vital Signs Assessment: post-procedure vital signs reviewed and stable Respiratory status: spontaneous breathing, nonlabored ventilation, respiratory function stable and patient connected to nasal cannula oxygen Cardiovascular status: blood pressure returned to baseline and stable Postop Assessment: no apparent nausea or vomiting Anesthetic complications: no   There were no known notable events for this encounter.   Last Vitals:  Vitals:   08/18/24 2348 08/19/24 0738  BP: 110/60 (!) 104/56  Pulse: (!) 58 61  Resp: 14 15  Temp: 36.4 C 36.6 C  SpO2: 98% 100%    Last Pain:  Vitals:   08/19/24 0738  TempSrc: Oral  PainSc: 2                  [name]      "

## 2024-08-19 NOTE — NC FL2 (Signed)
 " [place]  MEDICAID FL2 LEVEL OF CARE FORM     IDENTIFICATION  Patient Name: Diana Green Birthdate: [birth year] Sex: female Admission Date (Current Location): 08/18/2024  [facility] and [place] Number:  Chiropodist and Address:  [facility], [address] [place], [place] [address]      Provider Number: [id number]  Attending Physician Name and Address:  Lorelle Hussar, MD  Relative Name and Phone Number:  [name], Steedman, Emergency Contact  [phone] (    Current Level of Care: Hospital Recommended Level of Care: Skilled Nursing Facility Prior Approval Number:    Date Approved/Denied:   PASRR Number: [id number]  Discharge Plan: SNF    Current Diagnoses: Patient Active Problem List   Diagnosis Date Noted   S/P revision of total knee, left 08/18/2024   Class 2 severe obesity due to excess calories with serious comorbidity and body mass index (BMI) of 39.0 to 39.9 in adult 08/07/2024   Seasonal allergic rhinitis 08/07/2024   Medicare annual wellness visit, subsequent 08/07/2024   OSA (obstructive sleep apnea) 07/10/2024   CKD stage 3b, GFR 30-44 ml/min (HCC) 06/23/2024   Breast cancer screening by mammogram 06/20/2024   Fatigue 06/20/2024   Diarrhea 06/20/2024   Needs flu shot 06/20/2024   CHF (congestive heart failure), NYHA class I, acute, systolic (HCC) 06/20/2024   Hypertension associated with diabetes (HCC) 05/09/2024   Morbid obesity with BMI of 40.0-44.9, adult (HCC) 05/09/2024   Abnormal EKG 04/14/2024   Ankle edema, bilateral 04/14/2024   Diabetes mellitus due to underlying condition with hyperosmolarity without coma, without long-term current use of insulin  (HCC) 04/14/2024   Combined hyperlipidemia associated with type 2 diabetes mellitus (HCC) 04/14/2024   Acute URI 01/08/2024   GAD (generalized anxiety disorder) 12/03/2023   Gastroesophageal reflux disease without esophagitis 07/26/2023   Symptom of blood in  stool 02/19/2023   Prediabetes 01/08/2023   Arthritis 12/08/2022   Dehydration 12/08/2022   Mixed hyperlipidemia 10/09/2022   Essential hypertension, benign 10/09/2022   Vitamin D  deficiency 10/09/2022   Impaired glucose tolerance 10/09/2022   Iron deficiency anemia 10/09/2022   S/P TKR (total knee replacement) using cement, right 09/11/2022   Thoracic myelopathy 09/19/2021   Cervical myelopathy (HCC) 07/08/2021    Orientation RESPIRATION BLADDER Height & Weight     Self, Time, Situation, Place  Normal Incontinent Weight: 120.7 kg Height:  5' 9 (175.3 cm)  BEHAVIORAL SYMPTOMS/MOOD NEUROLOGICAL BOWEL NUTRITION STATUS      Continent Diet  AMBULATORY STATUS COMMUNICATION OF NEEDS Skin   Extensive Assist Verbally Surgical wounds (Left Knee, dry ace wrap)                       Personal Care Assistance Level of Assistance  Bathing, Feeding, Dressing Bathing Assistance: Limited assistance Feeding assistance: Independent Dressing Assistance: Limited assistance     Functional Limitations Info  Sight, Hearing, Speech Sight Info: Adequate (Wears glasses) Hearing Info: Adequate Speech Info: Adequate    SPECIAL CARE FACTORS FREQUENCY  PT (By licensed PT), OT (By licensed OT)     PT Frequency: 5x week OT Frequency: 5x week            Contractures Contractures Info: Not present    Additional Factors Info  Code Status, Allergies Code Status Info: Full Allergies Info: None           Current Medications (08/19/2024):  This is the current hospital active medication list Current Facility-Administered Medications  Medication Dose Route Frequency Provider Last Rate Last Admin   acetaminophen  (TYLENOL ) tablet 1,000 mg  1,000 mg Oral Q8H Aberman, Zachary, MD   1,000 mg at 08/18/24 1933   acetaminophen  (TYLENOL ) tablet 325-650 mg  325-650 mg Oral Q6H PRN Aberman, Zachary, MD       alum & mag hydroxide-simeth (MAALOX/MYLANTA) 200-200-20 MG/5ML suspension 30 mL  30 mL Oral  Q4H PRN Gaines, Thomas C, PA-C       carvedilol  (COREG ) tablet 25 mg  25 mg Oral BID Aberman, Zachary, MD   25 mg at 08/18/24 2148   docusate sodium  (COLACE) capsule 100 mg  100 mg Oral BID Aberman, Zachary, MD   100 mg at 08/18/24 2148   DULoxetine  (CYMBALTA ) DR capsule 60 mg  60 mg Oral q morning Aberman, Zachary, MD       enoxaparin  (LOVENOX ) injection 30 mg  30 mg Subcutaneous Q12H Aberman, Zachary, MD   30 mg at 08/19/24 9256   furosemide  (LASIX ) tablet 40 mg  40 mg Oral Daily Aberman, Zachary, MD       gabapentin  (NEURONTIN ) capsule 600 mg  600 mg Oral BID Aberman, Zachary, MD   600 mg at 08/18/24 2147   insulin  aspart (novoLOG ) injection 0-15 Units  0-15 Units Subcutaneous TID WC Aberman, Zachary, MD   2 Units at 08/19/24 9256   insulin  aspart (novoLOG ) injection 0-5 Units  0-5 Units Subcutaneous QHS Aberman, Zachary, MD       NOREEN ON 08/20/2024] losartan  (COZAAR ) tablet 100 mg  100 mg Oral Daily Charlene Ned C, PA-C       menthol  (CEPACOL) lozenge 3 mg  1 lozenge Oral PRN Aberman, Zachary, MD       Or   phenol (CHLORASEPTIC) mouth spray 1 spray  1 spray Mouth/Throat PRN Aberman, Zachary, MD       metoCLOPramide  (REGLAN ) tablet 5-10 mg  5-10 mg Oral Q8H PRN Aberman, Zachary, MD       Or   metoCLOPramide  (REGLAN ) injection 5-10 mg  5-10 mg Intravenous Q8H PRN Aberman, Zachary, MD       morphine  (PF) 4 MG/ML injection 0.52-1 mg  0.52-1 mg Intravenous Q2H PRN Aberman, Zachary, MD       ondansetron  (ZOFRAN ) tablet 4 mg  4 mg Oral Q6H PRN Aberman, Zachary, MD       Or   ondansetron  (ZOFRAN ) injection 4 mg  4 mg Intravenous Q6H PRN Aberman, Zachary, MD       oxyCODONE  (Oxy IR/ROXICODONE ) immediate release tablet 5 mg  5 mg Oral Q4H PRN Aberman, Zachary, MD   5 mg at 08/18/24 2147   pantoprazole  (PROTONIX ) EC tablet 40 mg  40 mg Oral Daily Aberman, Zachary, MD       rosuvastatin  (CRESTOR ) tablet 20 mg  20 mg Oral Daily Aberman, Zachary, MD   20 mg at 08/18/24 2148   sodium chloride  0.9 %  bolus 500 mL  500 mL Intravenous Once Gaines, Thomas C, PA-C       [START ON 08/20/2024] spironolactone  (ALDACTONE ) tablet 25 mg  25 mg Oral Daily Gaines, Thomas C, PA-C       traMADol  (ULTRAM ) tablet 50 mg  50 mg Oral Q6H PRN Aberman, Zachary, MD         Discharge Medications: Please see discharge summary for a list of discharge medications.  Relevant Imaging Results:  Relevant Lab Results:   Additional Information SS# [id number]  Racheal LITTIE Schimke, RN     "

## 2024-08-19 NOTE — Progress Notes (Addendum)
" ° °  Subjective: 1 Day Post-Op Procedures (LRB): TOTAL KNEE REVISION (Left) Patient reports pain as mild.   Patient is well, and has had no acute complaints or problems Denies any CP, SOB, ABD pain. Mild sore throat with congestion.  Afebrile history of reflux disease We will continue therapy today.  Plan is to go Skilled nursing facility after hospital stay.  Objective: Vital signs in last 24 hours: Temp:  [96.2 F (35.7 C)-97.9 F (36.6 C)] 97.8 F (36.6 C) (12/30 0738) Pulse Rate:  [30-61] 61 (12/30 0738) Resp:  [10-19] 15 (12/30 0738) BP: (102-133)/(56-84) 104/56 (12/30 0738) SpO2:  [88 %-100 %] 100 % (12/30 0738) Weight:  [120.7 kg] 120.7 kg (12/29 0857)  Intake/Output from previous day: 12/29 0701 - 12/30 0700 In: 887.5 [P.O.:100; I.V.:487.5; IV Piggyback:300] Out: 500 [Urine:450; Blood:50] Intake/Output this shift: No intake/output data recorded.  No results for input(s): HGB in the last 72 hours. No results for input(s): WBC, RBC, HCT, PLT in the last 72 hours. No results for input(s): NA, K, CL, CO2, BUN, CREATININE, GLUCOSE, CALCIUM  in the last 72 hours. No results for input(s): LABPT, INR in the last 72 hours.  EXAM General - Patient is Alert and Appropriate Extremity - Neurovascular intact Intact pulses distally Dorsiflexion/Plantar flexion intact No cellulitis present Compartment soft Able to straight leg raise Dressing - dressing C/D/I and no drainage Motor Function - intact, moving foot and toes well on exam.   Past Medical History:  Diagnosis Date   And by talking to you go    Arthritis    Cardiomyopathy due to hypertension, with heart failure (HCC)    CKD (chronic kidney disease), stage III (HCC)    DDD (degenerative disc disease), cervical    a.) s/p ACDF C3-C4 07/08/2021   DDD (degenerative disc disease), thoracolumbar    Depression    Dyspnea    GERD (gastroesophageal reflux disease)    HFrEF (heart failure with  reduced ejection fraction) (HCC)    High cholesterol    Hypertension    Iron deficiency anemia 10/09/2022   OSA on CPAP    RBBB (right bundle branch block)    T2DM (type 2 diabetes mellitus) (HCC)    Vitamin D  deficiency 10/09/2022    Assessment/Plan:   1 Day Post-Op Procedures (LRB): TOTAL KNEE REVISION (Left) Principal Problem:   S/P revision of total knee, left  Estimated body mass index is 39.28 kg/m as calculated from the following:   Height as of this encounter: 5' 9 (1.753 m).   Weight as of this encounter: 120.7 kg. Advance diet Up with therapy Pain well-controlled  Vital signs are stable, BP soft.  Asymptomatic.  Hold BP meds and get IV fluids  Blood work pending  Mild sore throat, patient with some burning along the esophageal region, history of reflux.  Will start Maalox, order flu/covid test. continue to monitor.  Afebrile   Care management to assist with discharge to skilled nursing facility  DVT Prophylaxis - Lovenox , TED hose, and SCDs Weight-Bearing as tolerated to left leg   T. Medford Amber, PA-C [facility] 08/19/2024, 8:10 AM   "

## 2024-08-19 NOTE — Evaluation (Signed)
 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 PMH includes: R TKA, HTN, and depression.     Clinical Impressions Patient presenting with decreased Ind in self care,balance, functional mobility, transfers, endurance, and safety awareness. Patient lives at home alone and is Ind at baseline. Patient currently functioning at min guard - min A for functional transfer with use of RW and min cuing for technique. Pt ambulates with step to pattern and heavy use of B UEs. Pt ambulates to bathroom for toileting needs with RW and min guard. She was able to void and stands to perform hygiene but needed assist with balance and clothing management. Pt stands at sink for hand hygiene and returns to sit in recliner chair. Call bell and all needed items within reach. Pt has no assistance at discharge.  Patient will benefit from acute OT to increase overall independence in the areas of ADLs, functional mobility, and safety awareness in order to safely discharge.     If plan is discharge home, recommend the following:   A little help with walking and/or transfers;A little help with bathing/dressing/bathroom;Assistance with cooking/housework;Assist for transportation;Help with stairs or ramp for entrance     Functional Status Assessment   Patient has had a recent decline in their functional status and demonstrates the ability to make significant improvements in function in a reasonable and predictable amount of time.     Equipment Recommendations   Other (comment) (defer to next venue of care)      Precautions/Restrictions   Precautions Precautions: Fall Restrictions Weight Bearing Restrictions Per  Provider Order: Yes LLE Weight Bearing Per Provider Order: Weight bearing as tolerated     Mobility Bed Mobility               General bed mobility comments: NT, in recliner pre/post session    Transfers Overall transfer level: Needs assistance Equipment used: Rolling walker (2 wheels) Transfers: Sit to/from Stand Sit to Stand: Min assist, Contact guard assist                  Balance Overall balance assessment: Needs assistance Sitting-balance support: Bilateral upper extremity supported, Feet supported Sitting balance-Leahy Scale: Normal     Standing balance support: Bilateral upper extremity supported, During functional activity, Reliant on assistive device for balance Standing balance-Leahy Scale: Fair                             ADL either performed or assessed with clinical judgement   ADL Overall ADL's : Needs assistance/impaired     Grooming: Wash/dry hands;Standing;Contact guard assist                   Toilet Transfer: Minimal assistance;BSC/3in1;Ambulation;Rolling walker (2 wheels)   Toileting- Clothing Manipulation and Hygiene: Moderate assistance;Sit to/from stand Toileting - Clothing Manipulation Details (indicate cue type and reason): assistance with clothing management and balance while pt performed hygiene in standing             Vision Baseline Vision/History: 1 Wears glasses Patient Visual Report: No change from baseline              Pertinent Vitals/Pain Pain Assessment Pain Assessment:  0-10 Pain Score: 2  Pain Location: L knee Pain Descriptors / Indicators: Sore Pain Intervention(s): Monitored during session, Premedicated before session, Repositioned, Ice applied     Extremity/Trunk Assessment Upper Extremity Assessment Upper Extremity Assessment: Overall WFL for tasks assessed   Lower Extremity Assessment Lower Extremity Assessment: LLE deficits/detail LLE Deficits / Details: BLE ankle strength,  AROM, and sensation to light touch grossly WNL       Communication Communication Communication: No apparent difficulties   Cognition Arousal: Alert Behavior During Therapy: WFL for tasks assessed/performed Cognition: No apparent impairments                               Following commands: Intact       Cueing  General Comments   Cueing Techniques: Verbal cues;Tactile cues;Visual cues              Home Living Family/patient expects to be discharged to:: Private residence Living Arrangements: Alone Available Help at Discharge:  (no assist at discharge) Type of Home: House Home Access: Stairs to enter [employer] of Steps: 4 Entrance Stairs-Rails: Right;Left (wide set) Home Layout: One level     Bathroom Shower/Tub: Chief Strategy Officer: Handicapped height Bathroom Accessibility: No   Home Equipment: Rollator (4 wheels);BSC/3in1   Additional Comments: Had a RW but it broke      Prior Functioning/Environment Prior Level of Function : Independent/Modified Independent             Mobility Comments: Mod Ind amb with a rollator, no fall history ADLs Comments: Ind with ADLs    OT Problem List: Decreased strength;Decreased safety awareness;Impaired balance (sitting and/or standing);Decreased activity tolerance   OT Treatment/Interventions: Self-care/ADL training;Therapeutic activities;Therapeutic exercise;Balance training;Energy conservation;DME and/or AE instruction      OT Goals(Current goals can be found in the care plan section)   Acute Rehab OT Goals Patient Stated Goal: to go home OT Goal Formulation: With patient Time For Goal Achievement: 09/02/24 Potential to Achieve Goals: Fair ADL Goals Pt Will Perform Grooming: with modified independence;standing Pt Will Perform Lower Body Dressing: with modified independence;sit to/from stand Pt Will Transfer to Toilet: with modified independence;ambulating Pt Will  Perform Toileting - Clothing Manipulation and hygiene: with modified independence;sit to/from stand   OT Frequency:  Min 2X/week       AM-PAC OT 6 Clicks Daily Activity     Outcome Measure Help from another person eating meals?: None Help from another person taking care of personal grooming?: A Little Help from another person toileting, which includes using toliet, bedpan, or urinal?: A Little Help from another person bathing (including washing, rinsing, drying)?: A Little Help from another person to put on and taking off regular upper body clothing?: None Help from another person to put on and taking off regular lower body clothing?: A Little 6 Click Score: 20   End of Session Equipment Utilized During Treatment: Rolling walker (2 wheels) Nurse Communication: Mobility status  Activity Tolerance: Patient tolerated treatment well Patient left: in bed;with call bell/phone within reach  OT Visit Diagnosis: Unsteadiness on feet (R26.81);Repeated falls (R29.6);Muscle weakness (generalized) (M62.81)                Time: 9050-8994 OT Time Calculation (min): 16 min Charges:  OT General Charges $OT Visit: 1 Visit OT Evaluation $OT Eval Low Complexity: 1 Low OT Treatments $Self Care/Home Management : 8-22 mins  Izetta Claude, MS, OTR/L , CBIS ascom  [phone]  08/19/2024, 12:34 PM

## 2024-08-19 NOTE — Progress Notes (Signed)
 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 PMH includes: R TKA, HTN, and depression.    PT Comments  Pt was pleasant and motivated to participate during the session and put forth good effort throughout. Pt found sitting at standard height recliner and required cuing and mod A to come to standing position.  Subsequent transfers performed with pt seated on pillow and able to come to standing with CGA/min A.  Pt education and practice provided on step-to sequencing during gait training secondary to pt reporting feeling that her L knee was unsteady with weight bearing. Pt able to take multiple steps forwards/backwards at the chair with no overt LOB or buckling so amb distance progressed and pt able to amb 8', perform a 180 deg turn and amb back to chair with step-to pattern and no instability/buckling.  Pt reported no adverse symptoms during the session other than mild L knee pain with SpO2 and HR WNL throughout on room air.      If plan is discharge home, recommend the following: A lot of help with walking and/or transfers;A little help with bathing/dressing/bathroom;Assistance with cooking/housework;Help with stairs or ramp for entrance;Assist for transportation   Can travel by private vehicle     No  Equipment Recommendations  Other (comment) (TBD at next venue of care)    Recommendations for Other Services       Precautions / Restrictions Precautions Precautions: Fall Restrictions Weight Bearing Restrictions Per Provider Order: Yes LLE Weight Bearing Per Provider Order: Weight bearing as tolerated     Mobility  Bed Mobility               General bed mobility comments: NT, in recliner  pre/post session    Transfers Overall transfer level: Needs assistance Equipment used: Rolling walker (2 wheels) Transfers: Sit to/from Stand Sit to Stand: From elevated surface, Mod assist           General transfer comment: Mod multi-modal cues for sequencing and mod A to come to full upright standing from standard height recliner and then CGA/Min A with height elevated by pillow    Ambulation/Gait Ambulation/Gait assistance: Contact guard assist Gait Distance (Feet): 30 Feet Assistive device: Rolling walker (2 wheels) Gait Pattern/deviations: Step-to pattern, Decreased step length - right, Decreased stance time - left Gait velocity: decreased     General Gait Details: Pt reported feeling that her left knee was subjectively unsteady in standing with step-to pattern education and practice provided for increased pt safety; pt able to take multiple steps forwards/backwards at the chair with no overt LOB or buckling so amb distance progressed and pt able to amb 8', perform a 180 deg turn and amb back to chair with step-to pattern and no instability/buckling   Stairs             Wheelchair Mobility     Tilt Bed    Modified Rankin (Stroke Patients Only)       Balance Overall balance assessment: Needs assistance Sitting-balance support: Bilateral upper extremity supported, Feet supported Sitting balance-Leahy Scale: Normal     Standing balance support: Bilateral upper extremity supported, During functional activity, Reliant on assistive device for balance Standing balance-Leahy Scale: Fair  Communication Communication Communication: No apparent difficulties  Cognition Arousal: Alert Behavior During Therapy: WFL for tasks assessed/performed   PT - Cognitive impairments: No apparent impairments                         Following commands: Intact      Cueing Cueing Techniques: Verbal cues, Tactile cues,  Visual cues  Exercises Total Joint Exercises Quad Sets: AROM, Strengthening, 10 reps, Left Long Arc Quad: AROM, Strengthening, 10 reps, Left, 5 reps Knee Flexion: AROM, Strengthening, 10 reps, Left, 5 reps Goniometric ROM: L knee AROM: 0-102 Marching in Standing: AROM, Strengthening, Both, 5 reps, Standing Other Exercises Other Exercises: LLE positioning education to promote L knee ext PROM Other Exercises: HEP review for LLE QS and seated knee flexion    General Comments        Pertinent Vitals/Pain Pain Assessment Pain Assessment: 0-10 Pain Score: 3  Pain Location: L knee Pain Descriptors / Indicators: Sore Pain Intervention(s): Premedicated before session, Monitored during session, Repositioned, Ice applied    Home Living                          Prior Function            PT Goals (current goals can now be found in the care plan section) Progress towards PT goals: Progressing toward goals    Frequency    BID      PT Plan      Co-evaluation              AM-PAC PT 6 Clicks Mobility   Outcome Measure  Help needed turning from your back to your side while in a flat bed without using bedrails?: A Lot Help needed moving from lying on your back to sitting on the side of a flat bed without using bedrails?: A Lot Help needed moving to and from a bed to a chair (including a wheelchair)?: A Lot Help needed standing up from a chair using your arms (e.g., wheelchair or bedside chair)?: A Lot Help needed to walk in hospital room?: A Little Help needed climbing 3-5 steps with a railing? : Total 6 Click Score: 12    End of Session Equipment Utilized During Treatment: Gait belt Activity Tolerance: Patient tolerated treatment well Patient left: in chair;with call bell/phone within reach;with family/visitor present Nurse Communication: Mobility status;Weight bearing status PT Visit Diagnosis: Unsteadiness on feet (R26.81);Other abnormalities of gait and  mobility (R26.89);Muscle weakness (generalized) (M62.81)     Time: 8958-8882 PT Time Calculation (min) (ACUTE ONLY): 36 min  Charges:    $Gait Training: 8-22 mins $Therapeutic Exercise: 8-22 mins PT General Charges $$ ACUTE PT VISIT: 1 Visit                    D. Scott Tom Ragsdale PT, DPT 08/19/2024, 11:46 AM

## 2024-08-19 NOTE — Progress Notes (Signed)
 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 PMH includes: R TKA, HTN, and depression.    PT Comments  Pt was seated in recliner upon arrival. She is A and O x 4 but does endorse feeling sad about family return home this afternoon to [place] . Pt remains motivated and pleasant throughout session. Did test positive for flu A this date but was willing to wear mask during session. She has productive cough observed, however able to clear secretions. She tolerated ROM and ambulation well. Remains far from her baseline abilities and will continue to benefit from skilled PT at DC to maximize independence and safety with all ADL.    If plan is discharge home, recommend the following: A little help with walking and/or transfers;A little help with bathing/dressing/bathroom;Assistance with cooking/housework;Assist for transportation;Help with stairs or ramp for entrance   Can travel by private vehicle     No  Equipment Recommendations  Other (comment) (Defer to next level of care)       Precautions / Restrictions Precautions Precautions: Fall Restrictions Weight Bearing Restrictions Per Provider Order: Yes LLE Weight Bearing Per Provider Order: Weight bearing as tolerated     Mobility  Bed Mobility  General bed mobility comments: NT, in recliner pre/post session    Transfers Overall transfer level: Needs assistance Equipment used: Rolling walker (2 wheels) Transfers: Sit to/from Stand Sit to Stand: Contact guard assist, Min assist  General transfer comment: Pt requires increased time + vcs for technique to achievestanding from recliner <> RW. poor eccentric control with stand to sit     Ambulation/Gait Ambulation/Gait assistance: Contact guard assist Gait Distance (Feet): 25 Feet Assistive device: Rolling walker (2 wheels) Gait Pattern/deviations: Step-to pattern, Decreased step length - right, Decreased stance time - left Gait velocity: decreased  General Gait Details: Pt was able to ambulated ~ 67ft in room with no LOB or knee buckling. Overall tolerated well but is limited by pain and fatigue. returned to recliner and perform stretching   Balance Overall balance assessment: Needs assistance Sitting-balance support: Bilateral upper extremity supported, Feet supported Sitting balance-Leahy Scale: Normal     Standing balance support: Bilateral upper extremity supported, During functional activity, Reliant on assistive device for balance Standing balance-Leahy Scale: Fair Standing balance comment: reliant on RW for all dynamic standing activity     Communication Communication Communication: No apparent difficulties  Cognition Arousal: Alert Behavior During Therapy: WFL for tasks assessed/performed   PT - Cognitive impairments: No apparent impairments    PT - Cognition Comments: Pt is A and O x 4. Following commands: Intact      Cueing Cueing Techniques: Verbal cues, Tactile cues  Exercises Total Joint Exercises Goniometric ROM: L knee AROM > 100 degrees flexion    General Comments General comments (skin integrity, edema, etc.): Reviewed HEP and importance of performance throughout the day to proojte strengthening and improved functional AROM      Pertinent Vitals/Pain Pain Assessment Pain Assessment: 0-10 Pain Score: 3  Pain Location: L knee Pain Descriptors / Indicators: Sore Pain Intervention(s): Limited activity within patient's tolerance, Monitored during session, Premedicated before session, Repositioned    Home Living Family/patient expects to be discharged to:: Private residence Living Arrangements: Alone Available Help at Discharge:  (  no  assist at discharge) Type of Home: House Home Access: Stairs to enter Entrance Stairs-Rails: Right;Left (wide set) Entrance Stairs-Number of Steps: 4   Home Layout: One level Home Equipment: Rollator (4 wheels);BSC/3in1 Additional Comments: Had a RW but it broke    Prior Function            PT Goals (current goals can now be found in the care plan section) Acute Rehab PT Goals Patient Stated Goal: Get well at rehab so I can return home. Progress towards PT goals: Progressing toward goals    Frequency    BID       AM-PAC PT 6 Clicks Mobility   Outcome Measure  Help needed turning from your back to your side while in a flat bed without using bedrails?: A Lot Help needed moving from lying on your back to sitting on the side of a flat bed without using bedrails?: A Little Help needed moving to and from a bed to a chair (including a wheelchair)?: A Lot Help needed standing up from a chair using your arms (e.g., wheelchair or bedside chair)?: A Lot Help needed to walk in hospital room?: A Little Help needed climbing 3-5 steps with a railing? : A Lot 6 Click Score: 14    End of Session Equipment Utilized During Treatment: Gait belt Activity Tolerance: Patient tolerated treatment well Patient left: in chair;with call bell/phone within reach Nurse Communication: Mobility status;Weight bearing status PT Visit Diagnosis: Unsteadiness on feet (R26.81);Other abnormalities of gait and mobility (R26.89);Muscle weakness (generalized) (M62.81)     Time: 8655-8587 PT Time Calculation (min) (ACUTE ONLY): 28 min  Charges:    $Gait Training: 8-22 mins $Therapeutic Exercise: 8-22 mins PT General Charges $$ ACUTE PT VISIT: 1 Visit                     Rankin Essex PTA 08/19/2024, 3:18 PM

## 2024-08-20 ENCOUNTER — Inpatient Hospital Stay

## 2024-08-20 LAB — GLUCOSE, CAPILLARY
Glucose-Capillary: 46 mg/dL — ABNORMAL LOW (ref 70–99)
Glucose-Capillary: 54 mg/dL — ABNORMAL LOW (ref 70–99)
Glucose-Capillary: 57 mg/dL — ABNORMAL LOW (ref 70–99)
Glucose-Capillary: 91 mg/dL (ref 70–99)
Glucose-Capillary: 98 mg/dL (ref 70–99)
Glucose-Capillary: 102 mg/dL — ABNORMAL HIGH (ref 70–99)
Glucose-Capillary: 95 mg/dL (ref 70–99)

## 2024-08-20 LAB — CBC
HCT: 31.8 % — ABNORMAL LOW (ref 36.0–46.0)
Hemoglobin: 10.4 g/dL — ABNORMAL LOW (ref 12.0–15.0)
MCH: 29.5 pg (ref 26.0–34.0)
MCHC: 32.7 g/dL (ref 30.0–36.0)
MCV: 90.3 fL (ref 80.0–100.0)
Platelets: 202 K/uL (ref 150–400)
RBC: 3.52 MIL/uL — ABNORMAL LOW (ref 3.87–5.11)
RDW: 14.1 % (ref 11.5–15.5)
WBC: 7.5 K/uL (ref 4.0–10.5)
nRBC: 0 % (ref 0.0–0.2)

## 2024-08-20 MED ORDER — ACETAMINOPHEN 500 MG PO TABS
1000.0000 mg | ORAL_TABLET | Freq: Three times a day (TID) | ORAL | Status: DC
  Administered 2024-08-20 – 2024-08-22 (×5): 1000 mg via ORAL
  Filled 2024-08-20 (×7): qty 2

## 2024-08-20 MED ORDER — IPRATROPIUM-ALBUTEROL 0.5-2.5 (3) MG/3ML IN SOLN
RESPIRATORY_TRACT | Status: AC
  Filled 2024-08-20: qty 3

## 2024-08-20 MED ORDER — IPRATROPIUM-ALBUTEROL 0.5-2.5 (3) MG/3ML IN SOLN
3.0000 mL | Freq: Four times a day (QID) | RESPIRATORY_TRACT | Status: DC | PRN
  Administered 2024-08-20 – 2024-08-21 (×2): 3 mL via RESPIRATORY_TRACT
  Filled 2024-08-20 (×2): qty 3

## 2024-08-20 MED ORDER — GABAPENTIN 300 MG PO CAPS
ORAL_CAPSULE | ORAL | Status: AC
  Filled 2024-08-20: qty 2

## 2024-08-20 NOTE — Progress Notes (Signed)
 PT Cancellation Note  Patient Details Name: Diana Green MRN: [id number] DOB: [birth year]   Cancelled Treatment:     PT attempt. Pt off floor for chest X ray. Acute PT will continue to follow and progress per current POC.    Rankin KATHEE Essex 08/20/2024, 10:52 AM

## 2024-08-20 NOTE — Progress Notes (Signed)
 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 PMH includes: R TKA, HTN, and depression.    PT Comments  Pt was asleep in long sitting upon arrival. She does awake however struggles throughout session to stay awake. Was given tramadol  ~ 3 hours prior to session and then tylenol  ~ 30 minutes prior. Pt needs constant encouragement to stay awake and perform desired task. Did not present this way in previous PT session. Care team was made aware. Blood sugar check since low earlier this date. Current BS 102. Author did have pt sit up EOB in hopes to improve her alertness. Upon sitting up, (max assist of one) pt starts having myoclonus/ jerky tremors. Continued to struggle to stay awake so assisted pt back into bed. She did tolerate HEP exercises but required extensive assistance for each exercise. Acute PT will continue to follow and progress per current POC. DC recs remain appropriate.    If plan is discharge home, recommend the following: A lot of help with walking and/or transfers;A lot of help with bathing/dressing/bathroom;Assistance with cooking/housework;Direct supervision/assist for medications management;Direct supervision/assist for financial management;Assist for transportation;Help with stairs or ramp for entrance     Equipment Recommendations  Other (comment) (Defer to next level of care)       Precautions / Restrictions Precautions Precautions: Fall Restrictions Weight Bearing Restrictions Per Provider Order: Yes LLE Weight Bearing Per Provider Order: Weight bearing as tolerated     Mobility  Bed Mobility Overal bed mobility: Needs Assistance Bed Mobility: Supine to Sit, Sit to Supine  Supine to  sit: Max assist Sit to supine: Max assist General bed mobility comments: Pt required much more extensive assistance to achieve EOB short sitting due to poor level of arousal. Pt remains lethargic even while seated EOB.    Transfers  General transfer comment: Pt having myoclonus and increased lethargy during this session versus last session observed by author previous date. Elected to return pt to bed and notify care team. blood sugars re-check with reading 102 currently      Balance Overall balance assessment: Needs assistance Sitting-balance support: Feet supported, Bilateral upper extremity supported Sitting balance-Leahy Scale: Fair Sitting balance - Comments: pt struggles to maintain even sitting balance today due too lethargy       Communication Communication Communication: Impaired Factors Affecting Communication: Reduced clarity of speech;Difficulty expressing self;Other (comment) (Due to severity of lethargy)  Cognition Arousal: Lethargic, Suspect due to medications    PT - Cognition Comments: Pt is very lethargic in presentation. She did not present like this last session observed by author. Ortho PA/MD made aware of change in status. Discontinued tramadol  after session Following commands: Impaired Following commands impaired: Follows one step commands inconsistently (due to poor level of arousal)    Cueing Cueing Techniques: Verbal cues, Tactile cues     General Comments General comments (skin integrity, edema, etc.): Reviewed and performed HEP however required constant encouragement to stay awake to fully participate.      Pertinent Vitals/Pain Pain Assessment Pain Assessment: 0-10 Pain Score: 6  Pain Location: L knee Pain Descriptors / Indicators: Sore Pain Intervention(s): Limited activity within patient's tolerance, Monitored during session, Premedicated before session, Repositioned, Ice applied     PT Goals (current  goals can now be found in the care plan  section) Acute Rehab PT Goals Patient Stated Goal: none stated Progress towards PT goals: Not progressing toward goals - comment (Much more lethargic today versus previous date)    Frequency    BID       AM-PAC PT 6 Clicks Mobility   Outcome Measure  Help needed turning from your back to your side while in a flat bed without using bedrails?: A Lot Help needed moving from lying on your back to sitting on the side of a flat bed without using bedrails?: A Lot Help needed moving to and from a bed to a chair (including a wheelchair)?: A Lot Help needed standing up from a chair using your arms (e.g., wheelchair or bedside chair)?: A Lot Help needed to walk in hospital room?: A Lot Help needed climbing 3-5 steps with a railing? : A Lot 6 Click Score: 12    End of Session   Activity Tolerance: Patient limited by lethargy Patient left: in bed;with call bell/phone within reach;with bed alarm set Nurse Communication: Mobility status PT Visit Diagnosis: Unsteadiness on feet (R26.81);Other abnormalities of gait and mobility (R26.89);Muscle weakness (generalized) (M62.81)     Time: 8573-8548 PT Time Calculation (min) (ACUTE ONLY): 25 min  Charges:    $Therapeutic Exercise: 8-22 mins $Therapeutic Activity: 8-22 mins PT General Charges $$ ACUTE PT VISIT: 1 Visit                    Rankin Essex PTA 08/20/2024, 4:24 PM

## 2024-08-20 NOTE — Progress Notes (Signed)
 Pt transferred to 1A  room 150. Bedside report given to Jasmine, CHARITY FUNDRAISER. Pt belongings sent with her luggage, dentures, and glasses. MD and family made aware.

## 2024-08-20 NOTE — Plan of Care (Signed)
" °  Problem: Education: Goal: Knowledge of the prescribed therapeutic regimen will improve Outcome: Progressing   Problem: Bowel/Gastric: Goal: Gastrointestinal status for postoperative course will improve Outcome: Progressing   Problem: Cardiac: Goal: Ability to maintain an adequate cardiac output Outcome: Progressing   Problem: Nutritional: Goal: Will attain and maintain optimal nutritional status Outcome: Progressing   Problem: Neurological: Goal: Will regain or maintain usual level of consciousness Outcome: Progressing   "

## 2024-08-20 NOTE — Progress Notes (Signed)
" ° °  Subjective: 2 Days Post-Op Procedures (LRB): TOTAL KNEE REVISION (Left) Patient reports pain as mild, 4 out of 10 Patient tested positive for flu yesterday.  Continues to have sore throat.  No chest pain or shortness of breath but is having some slight wheezing.  Fever 100.9 today. Denies any CP, SOB, ABD pain, nausea or vomiting We will continue therapy today.  Plan is to go Skilled nursing facility after hospital stay.  Objective: Vital signs in last 24 hours: Temp:  [98.4 F (36.9 C)-100.9 F (38.3 C)] 100.9 F (38.3 C) (12/31 0750) Pulse Rate:  [61-76] 76 (12/31 0750) Resp:  [15-17] 15 (12/31 0750) BP: (101-145)/(70-87) 145/81 (12/31 0750) SpO2:  [96 %-98 %] 96 % (12/31 0750)  Intake/Output from previous day: No intake/output data recorded. Intake/Output this shift: No intake/output data recorded.  Recent Labs    08/19/24 0842 08/20/24 0608  HGB 10.6* 10.4*   Recent Labs    08/19/24 0842 08/20/24 0608  WBC 11.3* 7.5  RBC 3.59* 3.52*  HCT 33.2* 31.8*  PLT 208 202   Recent Labs    08/19/24 0842  NA 139  K 4.8  CL 104  CO2 28  BUN 19  CREATININE 1.35*  GLUCOSE 172*  CALCIUM  9.9   No results for input(s): LABPT, INR in the last 72 hours.  EXAM General - Patient is Alert and Appropriate Lungs -slight expiratory wheezing bilaterally.  No rales or rhonchi Heart -regular rate and rhythm Extremity - Neurovascular intact Intact pulses distally Dorsiflexion/Plantar flexion intact No cellulitis present Compartment soft Able to straight leg raise Dressing - dressing C/D/I and no drainage Motor Function - intact, moving foot and toes well on exam.   Past Medical History:  Diagnosis Date   And by talking to you go    Arthritis    Cardiomyopathy due to hypertension, with heart failure (HCC)    CKD (chronic kidney disease), stage III (HCC)    DDD (degenerative disc disease), cervical    a.) s/p ACDF C3-C4 07/08/2021   DDD (degenerative disc  disease), thoracolumbar    Depression    Dyspnea    GERD (gastroesophageal reflux disease)    HFrEF (heart failure with reduced ejection fraction) (HCC)    High cholesterol    Hypertension    Iron deficiency anemia 10/09/2022   OSA on CPAP    RBBB (right bundle branch block)    T2DM (type 2 diabetes mellitus) (HCC)    Vitamin D  deficiency 10/09/2022    Assessment/Plan:   2 Days Post-Op Procedures (LRB): TOTAL KNEE REVISION (Left) Principal Problem:   S/P revision of total knee, left   Influenza A  Estimated body mass index is 39.28 kg/m as calculated from the following:   Height as of this encounter: 5' 9 (1.753 m).   Weight as of this encounter: 120.7 kg. Advance diet Up with therapy Pain well-controlled  Vital signs are stable  Labs are stable  Positive for influenza -continue with symptomatic treatment.  Patient tested +3 to 4 days after symptom onset.  Subtle expiratory wheezing.  Will give breathing treatments every 6 hours as needed.  Continue to monitor.  Temp of 100.9 today.  Will order chest x-ray  Care management to assist with discharge to skilled nursing facility  DVT Prophylaxis - Lovenox , TED hose, and SCDs Weight-Bearing as tolerated to left leg   T. Medford Amber, PA-C [facility] 08/20/2024, 8:06 AM   "

## 2024-08-21 LAB — GLUCOSE, CAPILLARY
Glucose-Capillary: 130 mg/dL — ABNORMAL HIGH (ref 70–99)
Glucose-Capillary: 102 mg/dL — ABNORMAL HIGH (ref 70–99)
Glucose-Capillary: 131 mg/dL — ABNORMAL HIGH (ref 70–99)
Glucose-Capillary: 98 mg/dL (ref 70–99)

## 2024-08-21 MED ORDER — CARVEDILOL 25 MG PO TABS: 25.0000 mg | ORAL_TABLET | Freq: Two times a day (BID) | ORAL | Status: DC

## 2024-08-21 MED ORDER — LOSARTAN POTASSIUM 50 MG PO TABS: 100.0000 mg | ORAL_TABLET | Freq: Every day | ORAL | Status: DC

## 2024-08-21 MED ORDER — METHYLPREDNISOLONE SODIUM SUCC 125 MG IJ SOLR
125.0000 mg | Freq: Once | INTRAMUSCULAR | Status: AC
  Administered 2024-08-21: 125 mg via INTRAVENOUS
  Filled 2024-08-21: qty 2

## 2024-08-21 MED ORDER — DOCUSATE SODIUM 100 MG PO CAPS: 100.0000 mg | ORAL_CAPSULE | Freq: Two times a day (BID) | ORAL | 0 refills | Status: DC

## 2024-08-21 MED ORDER — CARVEDILOL 25 MG PO TABS
25.0000 mg | ORAL_TABLET | Freq: Two times a day (BID) | ORAL | Status: DC
  Administered 2024-08-21 – 2024-08-22 (×3): 25 mg via ORAL
  Filled 2024-08-21 (×3): qty 1

## 2024-08-21 MED ORDER — ONDANSETRON HCL 4 MG PO TABS: 4.0000 mg | ORAL_TABLET | Freq: Four times a day (QID) | ORAL | 0 refills | Status: DC | PRN

## 2024-08-21 MED ORDER — OXYCODONE HCL 5 MG PO TABS: 2.5000 mg | ORAL_TABLET | Freq: Four times a day (QID) | ORAL | 0 refills | Status: AC | PRN

## 2024-08-21 MED ORDER — IPRATROPIUM-ALBUTEROL 0.5-2.5 (3) MG/3ML IN SOLN
6.0000 mL | Freq: Once | RESPIRATORY_TRACT | Status: AC
  Administered 2024-08-21: 6 mL via RESPIRATORY_TRACT
  Filled 2024-08-21: qty 6

## 2024-08-21 MED ORDER — ENOXAPARIN SODIUM 40 MG/0.4ML IJ SOSY: 40.0000 mg | PREFILLED_SYRINGE | INTRAMUSCULAR | 0 refills | Status: DC

## 2024-08-21 MED ORDER — OXYCODONE HCL 5 MG PO TABS
2.5000 mg | ORAL_TABLET | ORAL | Status: DC | PRN
  Administered 2024-08-21 – 2024-08-22 (×3): 2.5 mg via ORAL
  Filled 2024-08-21 (×4): qty 1

## 2024-08-21 MED ORDER — ACETAMINOPHEN 500 MG PO TABS: 1000.0000 mg | ORAL_TABLET | Freq: Three times a day (TID) | ORAL | Status: AC

## 2024-08-21 NOTE — Progress Notes (Signed)
 "  Subjective: 3 Days Post-Op Procedures (LRB): TOTAL KNEE REVISION (Left) Patient reports pain as moderate, 7 out of 10.  Has only been taking Tylenol .  Tramadol  caused her to have some significant sedation yesterday.  Tramadol  discontinued but she has been hesitant to take the oxycodone .  Has tolerated oxycodone  well in the past. Patient flu positive.  Cough and congestion improving.  Wheezing yesterday has nearly resolved.  No breathing treatments today.  Chest x-ray negative for any acute cardiopulmonary process.  She denies any sore throat, chills or bodyaches.  States she is feeling much better today. Blood pressure soft, no dizziness with standing.  Denies any CP, SOB, ABD pain, nausea or vomiting.  Positive bowel movement yesterday. We will continue therapy today.  Plan is to go Skilled nursing facility after hospital stay.  Objective: Vital signs in last 24 hours: Temp:  [98.5 F (36.9 C)-100 F (37.8 C)] 98.5 F (36.9 C) (01/01 0748) Pulse Rate:  [63-70] 63 (01/01 0748) Resp:  [16-18] 18 (01/01 0748) BP: (96-131)/(58-74) 96/58 (01/01 0748) SpO2:  [94 %-97 %] 94 % (01/01 0748)  Intake/Output from previous day: 12/31 0701 - 01/01 0700 In: 120 [P.O.:120] Out: 650 [Urine:650] Intake/Output this shift: Total I/O In: -  Out: 450 [Urine:450]  Recent Labs    08/19/24 0842 08/20/24 0608  HGB 10.6* 10.4*   Recent Labs    08/19/24 0842 08/20/24 0608  WBC 11.3* 7.5  RBC 3.59* 3.52*  HCT 33.2* 31.8*  PLT 208 202   Recent Labs    08/19/24 0842  NA 139  K 4.8  CL 104  CO2 28  BUN 19  CREATININE 1.35*  GLUCOSE 172*  CALCIUM  9.9   No results for input(s): LABPT, INR in the last 72 hours.  EXAM General - Patient is Alert and Appropriate.  Patient standing well with PT.  No dizziness or lightheadedness Lungs -very subtle expiratory wheezing.  Good breath sounds bilaterally.  No rales or rhonchi Heart -regular rate and rhythm Extremity - Neurovascular  intact Intact pulses distally Dorsiflexion/Plantar flexion intact No cellulitis present Compartment soft Able to straight leg raise Negative Homans' sign bilaterally Dressing - dressing C/D/I and no drainage Motor Function - intact, moving foot and toes well on exam.   Past Medical History:  Diagnosis Date   And by talking to you go    Arthritis    Cardiomyopathy due to hypertension, with heart failure (HCC)    CKD (chronic kidney disease), stage III (HCC)    DDD (degenerative disc disease), cervical    a.) s/p ACDF C3-C4 07/08/2021   DDD (degenerative disc disease), thoracolumbar    Depression    Dyspnea    GERD (gastroesophageal reflux disease)    HFrEF (heart failure with reduced ejection fraction) (HCC)    High cholesterol    Hypertension    Iron deficiency anemia 10/09/2022   OSA on CPAP    RBBB (right bundle branch block)    T2DM (type 2 diabetes mellitus) (HCC)    Vitamin D  deficiency 10/09/2022    Assessment/Plan:   3 Days Post-Op Procedures (LRB): TOTAL KNEE REVISION (Left) Principal Problem:   S/P revision of total knee, left   Influenza A  Estimated body mass index is 39.28 kg/m as calculated from the following:   Height as of this encounter: 5' 9 (1.753 m).   Weight as of this encounter: 120.7 kg. Advance diet Up with therapy  Pain moderate.  She has only been taking Tylenol .  Tramadol  was discontinued due to side effects, possible interactions with Cymbalta .  Patient has had similar side effects with tramadol  in the past.  She has tolerated oxycodone  well but is hesitant to take it today.  We reduced oxycodone  dosage to 2.5 mg, patient is willing to try this medication in addition to Tylenol .   Vital signs are stable.  Blood pressure soft but asymptomatic.  Will hold a couple of blood pressure medicines this morning.  Continue to monitor.  Patient standing with physical therapy and asymptomatic.  Positive influenza A.  She is now 4 to 5 days out from  symptom onset.  Feeling better.  Cough and congestion improving.  She has been afebrile today.  Chest x-ray negative for any acute cardiopulmonary process.  Wheezing significantly improved with DuoNeb treatment.  Continue with as needed DuoNeb.  Patient is medically stable for discharge.  Care management to assist with discharge to skilled nursing facility  DVT Prophylaxis - Lovenox , TED hose, and SCDs Weight-Bearing as tolerated to left leg   T. Medford Amber, PA-C [facility] 08/21/2024, 10:53 AM   "

## 2024-08-21 NOTE — Plan of Care (Signed)
 " Problem: Education: Goal: Knowledge of the prescribed therapeutic regimen will improve Outcome: Progressing   Problem: Bowel/Gastric: Goal: Gastrointestinal status for postoperative course will improve Outcome: Progressing   Problem: Cardiac: Goal: Ability to maintain an adequate cardiac output Outcome: Progressing Goal: Will show no evidence of cardiac arrhythmias Outcome: Progressing   Problem: Nutritional: Goal: Will attain and maintain optimal nutritional status Outcome: Progressing   Problem: Neurological: Goal: Will regain or maintain usual level of consciousness Outcome: Progressing   Problem: Clinical Measurements: Goal: Ability to maintain clinical measurements within normal limits Outcome: Progressing Goal: Postoperative complications will be avoided or minimized Outcome: Progressing   Problem: Respiratory: Goal: Will regain and/or maintain adequate ventilation Outcome: Progressing Goal: Respiratory status will improve Outcome: Progressing   Problem: Skin Integrity: Goal: Demonstrates signs of wound healing without infection Outcome: Progressing   Problem: Urinary Elimination: Goal: Will remain free from infection Outcome: Progressing Goal: Ability to achieve and maintain adequate urine output Outcome: Progressing   Problem: Education: Goal: Ability to describe self-care measures that may prevent or decrease complications (Diabetes Survival Skills Education) will improve Outcome: Progressing Goal: Individualized Educational Video(s) Outcome: Progressing   Problem: Coping: Goal: Ability to adjust to condition or change in health will improve Outcome: Progressing   Problem: Fluid Volume: Goal: Ability to maintain a balanced intake and output will improve Outcome: Progressing   Problem: Health Behavior/Discharge Planning: Goal: Ability to identify and utilize available resources and services will improve Outcome: Progressing Goal: Ability to  manage health-related needs will improve Outcome: Progressing   Problem: Metabolic: Goal: Ability to maintain appropriate glucose levels will improve Outcome: Progressing   Problem: Nutritional: Goal: Maintenance of adequate nutrition will improve Outcome: Progressing Goal: Progress toward achieving an optimal weight will improve Outcome: Progressing   Problem: Skin Integrity: Goal: Risk for impaired skin integrity will decrease Outcome: Progressing   Problem: Tissue Perfusion: Goal: Adequacy of tissue perfusion will improve Outcome: Progressing   Problem: Education: Goal: Knowledge of the prescribed therapeutic regimen will improve Outcome: Progressing Goal: Individualized Educational Video(s) Outcome: Progressing   Problem: Activity: Goal: Ability to avoid complications of mobility impairment will improve Outcome: Progressing Goal: Range of joint motion will improve Outcome: Progressing   Problem: Clinical Measurements: Goal: Postoperative complications will be avoided or minimized Outcome: Progressing   Problem: Pain Management: Goal: Pain level will decrease with appropriate interventions Outcome: Progressing   Problem: Skin Integrity: Goal: Will show signs of wound healing Outcome: Progressing   Problem: Education: Goal: Knowledge of the prescribed therapeutic regimen will improve Outcome: Progressing Goal: Individualized Educational Video(s) Outcome: Progressing   Problem: Activity: Goal: Ability to avoid complications of mobility impairment will improve Outcome: Progressing Goal: Range of joint motion will improve Outcome: Progressing   Problem: Clinical Measurements: Goal: Postoperative complications will be avoided or minimized Outcome: Progressing   Problem: Pain Management: Goal: Pain level will decrease with appropriate interventions Outcome: Progressing   Problem: Skin Integrity: Goal: Will show signs of wound healing Outcome:  Progressing   Problem: Education: Goal: Knowledge of General Education information will improve Description: Including pain rating scale, medication(s)/side effects and non-pharmacologic comfort measures Outcome: Progressing   Problem: Health Behavior/Discharge Planning: Goal: Ability to manage health-related needs will improve Outcome: Progressing   Problem: Clinical Measurements: Goal: Ability to maintain clinical measurements within normal limits will improve Outcome: Progressing Goal: Will remain free from infection Outcome: Progressing Goal: Diagnostic test results will improve Outcome: Progressing Goal: Respiratory complications will improve Outcome: Progressing Goal: Cardiovascular complication will be avoided  Outcome: Progressing   "

## 2024-08-21 NOTE — Discharge Summary (Signed)
 Physician Discharge Summary  Patient ID: Diana Green MRN: [id number] DOB/AGE: 1950-10-25 74 y.o.  Admit date: 08/18/2024 Discharge date: 08/22/24  Admission Diagnoses:  Pain due to total left knee replacement, initial encounter [U15.15KJ, Z96.652] S/P revision of total knee, left [Z96.652]   Discharge Diagnoses: Patient Active Problem List   Diagnosis Date Noted   S/P revision of total knee, left 08/18/2024   Class 2 severe obesity due to excess calories with serious comorbidity and body mass index (BMI) of 39.0 to 39.9 in adult 08/07/2024   Seasonal allergic rhinitis 08/07/2024   Medicare annual wellness visit, subsequent 08/07/2024   OSA (obstructive sleep apnea) 07/10/2024   CKD stage 3b, GFR 30-44 ml/min (HCC) 06/23/2024   Breast cancer screening by mammogram 06/20/2024   Fatigue 06/20/2024   Diarrhea 06/20/2024   Needs flu shot 06/20/2024   CHF (congestive heart failure), NYHA class I, acute, systolic (HCC) 06/20/2024   Hypertension associated with diabetes (HCC) 05/09/2024   Morbid obesity with BMI of 40.0-44.9, adult (HCC) 05/09/2024   Abnormal EKG 04/14/2024   Ankle edema, bilateral 04/14/2024   Diabetes mellitus due to underlying condition with hyperosmolarity without coma, without long-term current use of insulin  (HCC) 04/14/2024   Combined hyperlipidemia associated with type 2 diabetes mellitus (HCC) 04/14/2024   Acute URI 01/08/2024   GAD (generalized anxiety disorder) 12/03/2023   Gastroesophageal reflux disease without esophagitis 07/26/2023   Symptom of blood in stool 02/19/2023   Prediabetes 01/08/2023   Arthritis 12/08/2022   Dehydration 12/08/2022   Mixed hyperlipidemia 10/09/2022   Essential hypertension, benign 10/09/2022   Vitamin D  deficiency 10/09/2022   Impaired glucose tolerance 10/09/2022   Iron deficiency anemia 10/09/2022   S/P TKR (total knee replacement) using cement, right 09/11/2022   Thoracic myelopathy 09/19/2021   Cervical myelopathy  (HCC) 07/08/2021    Past Medical History:  Diagnosis Date   And by talking to you go    Arthritis    Cardiomyopathy due to hypertension, with heart failure (HCC)    CKD (chronic kidney disease), stage III (HCC)    DDD (degenerative disc disease), cervical    a.) s/p ACDF C3-C4 07/08/2021   DDD (degenerative disc disease), thoracolumbar    Depression    Dyspnea    GERD (gastroesophageal reflux disease)    HFrEF (heart failure with reduced ejection fraction) (HCC)    High cholesterol    Hypertension    Iron deficiency anemia 10/09/2022   OSA on CPAP    RBBB (right bundle branch block)    T2DM (type 2 diabetes mellitus) (HCC)    Vitamin D  deficiency 10/09/2022     Transfusion: None   Consultants (if any):   Discharged Condition: Improved  Hospital Course: Diana Green is an 74 y.o. female who was admitted 08/18/2024 with a diagnosis of S/P revision of total knee, left and went to the operating room on 08/18/2024 and underwent the above named procedures.    Surgeries: Procedures: TOTAL KNEE REVISION on 08/18/2024 Patient tolerated the surgery well. Taken to PACU where she was stabilized and then transferred to the orthopedic floor.  Started on Lovenox  30 mg q 12 hrs. TEDs and SCDs applied bilaterally. Heels elevated on bed. No evidence of DVT. Negative Homan. Physical therapy started on day #1 for gait training and transfer. OT started day #1 for ADL and assisted devices.  On postop day 1, patient with sore throat and bodyaches.  Patient tested positive for influenza A.  Stated symptoms have been ongoing 2  days prior to surgery.  Patient with some subtle wheezing, DuoNeb ordered and wheezing nearly resolved.  On postop day 2, patient with low-grade temp, chest x-ray negative for any acute cardiopulmonary process.  By postop day 3, cough congestion had improved.  Patient afebrile.  Soft blood pressure, BP meds held.  Asymptomatic with PT.  Slow progress with PT.  PT recommended  discharge to skilled nursing facility.  On post op day #4 vital signs stable, pain well controlled. Patient stable and ready for discharge to skilled nursing facility.  Implants:  Femur unchanged   tibia unchanged Poly 15 mm size 4 rotating platform, stabilized DePuy patella 38 mm X9.5 mm symmetric cemented    She was given perioperative antibiotics:  Anti-infectives (From admission, onward)    Start     Dose/Rate Route Frequency Ordered Stop   08/18/24 1700  ceFAZolin  (ANCEF ) IVPB 2g/100 mL premix        2 g 200 mL/hr over 30 Minutes Intravenous Every 6 hours 08/18/24 1416 08/18/24 2256   08/18/24 0830  ceFAZolin  (ANCEF ) IVPB 3g/150 mL premix        3 g 300 mL/hr over 30 Minutes Intravenous On call to O.R. 08/18/24 9170 08/18/24 1039     .  She was given sequential compression devices, early ambulation, and Lovenox , teds for DVT prophylaxis.  She benefited maximally from the hospital stay and there were no complications.    Recent vital signs:  Vitals:   08/22/24 0352 08/22/24 0809  BP: 122/67 (!) 118/57  Pulse: 68 63  Resp: 16 17  Temp: 98.5 F (36.9 C) 98 F (36.7 C)  SpO2: 96% 95%    Recent laboratory studies:  Lab Results  Component Value Date   HGB 10.4 (L) 08/20/2024   HGB 10.6 (L) 08/19/2024   HGB 11.6 06/20/2024   Lab Results  Component Value Date   WBC 7.5 08/20/2024   PLT 202 08/20/2024   Lab Results  Component Value Date   INR 1.0 06/28/2021   Lab Results  Component Value Date   NA 139 08/19/2024   K 4.8 08/19/2024   CL 104 08/19/2024   CO2 28 08/19/2024   BUN 19 08/19/2024   CREATININE 1.35 (H) 08/19/2024   GLUCOSE 172 (H) 08/19/2024    Discharge Medications:   Allergies as of 08/22/2024   No Known Allergies      Medication List     TAKE these medications    acetaminophen  500 MG tablet Commonly known as: TYLENOL  Take 2 tablets (1,000 mg total) by mouth every 8 (eight) hours. What changed:  how much to take when to take  this reasons to take this   carvedilol  25 MG tablet Commonly known as: Coreg  Take 1 tablet (25 mg total) by mouth 2 (two) times daily.   cyanocobalamin 1000 MCG tablet Commonly known as: VITAMIN B12 Take 1,000 mcg by mouth daily.   docusate sodium  100 MG capsule Commonly known as: COLACE Take 1 capsule (100 mg total) by mouth 2 (two) times daily.   DULoxetine  60 MG capsule Commonly known as: CYMBALTA  TAKE 1 CAPSULE BY MOUTH IN THE  MORNING   enoxaparin  40 MG/0.4ML injection Commonly known as: LOVENOX  Inject 0.4 mLs (40 mg total) into the skin daily.   fluticasone  50 MCG/ACT nasal spray Commonly known as: FLONASE  Place 1 spray into both nostrils daily.   furosemide  40 MG tablet Commonly known as: Lasix  Take 1 tablet (40 mg total) by mouth daily.   gabapentin   600 MG tablet Commonly known as: NEURONTIN  TAKE 1 TABLET BY MOUTH TWICE  DAILY   glimepiride  2 MG tablet Commonly known as: Amaryl  Take 1 tablet (2 mg total) by mouth daily with breakfast.   Jardiance  10 MG Tabs tablet Generic drug: empagliflozin  Take 10 mg by mouth daily.   lidocaine  4 % Place 1 patch onto the skin daily as needed (back pain).   loperamide  2 MG capsule Commonly known as: Imodium  A-D Take 1 capsule (2 mg total) by mouth as needed for diarrhea or loose stools.   loratadine  10 MG tablet Commonly known as: Claritin  Take 1 tablet (10 mg total) by mouth daily.   losartan  100 MG tablet Commonly known as: Cozaar  Take 1 tablet (100 mg total) by mouth daily. What changed: when to take this   meclizine  25 MG tablet Commonly known as: ANTIVERT  Take 1 tablet (25 mg total) by mouth 3 (three) times daily as needed for dizziness.   Mounjaro  2.5 MG/0.5ML Pen Generic drug: tirzepatide  Inject 2.5 mg into the skin once a week.   ondansetron  4 MG tablet Commonly known as: ZOFRAN  Take 1 tablet (4 mg total) by mouth every 6 (six) hours as needed for nausea.   oxyCODONE  5 MG immediate release  tablet Commonly known as: Oxy IR/ROXICODONE  Take 0.5-1 tablets (2.5-5 mg total) by mouth every 6 (six) hours as needed for severe pain (pain score 7-10).   rosuvastatin  20 MG tablet Commonly known as: CRESTOR  TAKE 1 TABLET BY MOUTH DAILY What changed: when to take this   spironolactone  25 MG tablet Commonly known as: Aldactone  Take 1 tablet (25 mg total) by mouth daily. What changed: when to take this        Diagnostic Studies: DG Chest 2 View Result Date: 08/20/2024 EXAM: 2 VIEW(S) XRAY OF THE CHEST 08/20/2024 10:59:00 AM COMPARISON: None available. CLINICAL HISTORY: cough wheezing flu FINDINGS: LUNGS AND PLEURA: Low lung volumes. No focal pulmonary opacity. No pleural effusion. No pneumothorax. HEART AND MEDIASTINUM: Heart size at upper limits of normal. No acute abnormality of the mediastinal silhouette. BONES AND SOFT TISSUES: Severe degenerative changes of left shoulder. Partially imaged cervical and thoracolumbar fusion hardware. IMPRESSION: 1. No acute cardiopulmonary abnormality. Electronically signed by: Morgane Naveau MD 08/20/2024 12:01 PM EST RP Workstation: [id number]   DG Knee 1-2 Views Left Result Date: 08/18/2024 CLINICAL DATA:  Elective surgery. EXAM: LEFT KNEE - 1-2 VIEW COMPARISON:  None Available. FINDINGS: Left knee arthroplasty in expected alignment. No periprosthetic lucency or fracture. There has been patellar resurfacing. Recent postsurgical change includes air and edema in the soft tissues and joint space. IMPRESSION: Left knee arthroplasty without immediate postoperative complication. Electronically Signed   By: Andrea Gasman M.D.   On: 08/18/2024 14:17    Disposition:      Contact information for follow-up providers     Charlene Debby BROCKS, PA-C Follow up in 2 week(s).   Specialties: Orthopedic Surgery, Emergency Medicine Contact information: [address] [place] [address] [phone]              Contact information for  after-discharge care     Destination     [facility] .   Service: Skilled Nursing Contact information: [address] [place]  [address] [phone]                      Signed: Debby BROCKS Charlene 08/22/2024, 8:22 AM

## 2024-08-21 NOTE — Progress Notes (Signed)
 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 PMH includes: R TKA, HTN, and depression.   OT comments  Upon entering the room, pt seated in recliner chair and agreeable to OT intervention. Pt able to thread mesh underwear onto L foot but needs assistance with R LE. Pt stands with mod A from low recliner chair with cues for hand placement and technique. Assist needed to pull over B hips while standing. Pt marches in place x 10 reps and endorses increased pain in L LE. Pt returning to seated position in recliner chair. RN called for medication. Pt declined other self care tasks and transfer back to bed at this time. Polar care donned.       If plan is discharge home, recommend the following:  A little help with walking and/or transfers;A little help with bathing/dressing/bathroom;Assistance with cooking/housework;Assist for transportation;Help with stairs or ramp for entrance   Equipment Recommendations  Other (comment) (defer)       Precautions / Restrictions Precautions Precautions: Fall Restrictions Weight Bearing Restrictions Per Provider Order: Yes LLE Weight Bearing Per Provider Order: Weight bearing as tolerated       Mobility Bed Mobility               General bed mobility comments: seated in recliner chair    Transfers Overall transfer level: Needs assistance Equipment used: Rolling walker (2 wheels) Transfers: Sit to/from Stand Sit to Stand: Mod assist           General transfer comment: mod lifting assistance to stand from recliner chair     Balance Overall balance assessment: Needs assistance Sitting-balance support: Feet supported, Bilateral upper extremity  supported Sitting balance-Leahy Scale: Good     Standing balance support: Bilateral upper extremity supported, During functional activity, Reliant on assistive device for balance Standing balance-Leahy Scale: Fair                             ADL either performed or assessed with clinical judgement   ADL Overall ADL's : Needs assistance/impaired                     Lower Body Dressing: Minimal assistance Lower Body Dressing Details (indicate cue type and reason): to thread clothing onto R LE and for balance to pull over B hips                    Extremity/Trunk Assessment Upper Extremity Assessment Upper Extremity Assessment: Overall WFL for tasks assessed            Vision Patient Visual Report: No change from baseline           Communication Communication Communication: Impaired Factors Affecting Communication: Reduced clarity of speech;Difficulty expressing self;Other (comment)   Cognition Arousal: Alert Behavior During Therapy: WFL for tasks assessed/performed Cognition: No apparent impairments                               Following commands: Impaired Following commands impaired: Follows one step commands inconsistently      Cueing   Cueing Techniques: Verbal cues, Tactile cues  Pertinent Vitals/ Pain       Pain Assessment Pain Assessment: 0-10 Pain Score: 7  Pain Location: L knee Pain Descriptors / Indicators: Sore, Discomfort Pain Intervention(s): Monitored during session, Repositioned, Ice applied, Patient requesting pain meds-RN notified         Frequency  Min 2X/week        Progress Toward Goals  OT Goals(current goals can now be found in the care plan section)  Progress towards OT goals: Progressing toward goals      AM-PAC OT 6 Clicks Daily Activity     Outcome Measure   Help from another person eating meals?: None Help from another person taking care of personal grooming?: A  Little Help from another person toileting, which includes using toliet, bedpan, or urinal?: A Little Help from another person bathing (including washing, rinsing, drying)?: A Little Help from another person to put on and taking off regular upper body clothing?: None Help from another person to put on and taking off regular lower body clothing?: A Little 6 Click Score: 20    End of Session Equipment Utilized During Treatment: Rolling walker (2 wheels)  OT Visit Diagnosis: Unsteadiness on feet (R26.81);Repeated falls (R29.6);Muscle weakness (generalized) (M62.81)   Activity Tolerance Patient tolerated treatment well   Patient Left with call bell/phone within reach;in chair   Nurse Communication Mobility status        Time: 8599-8576 OT Time Calculation (min): 23 min  Charges: OT General Charges $OT Visit: 1 Visit OT Treatments $Self Care/Home Management : 23-37 mins  Izetta Claude, MS, OTR/L , CBIS ascom [phone]  08/21/2024, 2:57 PM

## 2024-08-21 NOTE — Progress Notes (Signed)
 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 PMH includes: R TKA, HTN, and depression.    PT Comments  Pt continues to progress functionally. Received Tylenol  and 2.5mg  Oxy prior to session. Pt able to progress gait to 48ft with heavy reliance on RW, less pain at 5/10. Will continue to progress as tolerated, ongoing wheezing noted however has not had albuterol since floor transfer, nursing to address. Initial recs for STR at d/c remain appropriate.    If plan is discharge home, recommend the following: A lot of help with bathing/dressing/bathroom;Assistance with cooking/housework;Direct supervision/assist for medications management;Direct supervision/assist for financial management;Assist for transportation;Help with stairs or ramp for entrance;A little help with walking and/or transfers   Can travel by private vehicle     No  Equipment Recommendations  Other (comment) (TBD at next level of care)    Recommendations for Other Services       Precautions / Restrictions Precautions Precautions: Fall Recall of Precautions/Restrictions: Intact Restrictions Weight Bearing Restrictions Per Provider Order: Yes LLE Weight Bearing Per Provider Order: Weight bearing as tolerated     Mobility  Bed Mobility               General bed mobility comments: seated in recliner chair    Transfers Overall transfer level: Needs assistance Equipment used: Rolling walker (2 wheels) Transfers: Sit to/from Stand Sit to Stand: Mod assist           General transfer comment:  (vc's and ModA to power up to standing from low recliner and gain initial balance.)    Ambulation/Gait Ambulation/Gait assistance:  Contact guard assist Gait Distance (Feet):  (45) Assistive device: Rolling walker (2 wheels) Gait Pattern/deviations: Step-to pattern, Decreased step length - right, Decreased stance time - left, Antalgic Gait velocity: decreased     General Gait Details:  (Pt ambulated out in hallway today with chair follow. Increased tolerance for distance. Continues to wheez upon exertion, pt has not had a breathing treatment since leaving post-op.)   Stairs             Wheelchair Mobility     Tilt Bed    Modified Rankin (Stroke Patients Only)       Balance Overall balance assessment: Needs assistance Sitting-balance support: Feet supported, Bilateral upper extremity supported Sitting balance-Leahy Scale: Good     Standing balance support: Bilateral upper extremity supported, During functional activity, Reliant on assistive device for balance Standing balance-Leahy Scale: Fair Standing balance comment: reliant on RW for all dynamic standing activity                            Communication Communication Communication: No apparent difficulties Factors Affecting Communication: Reduced clarity of speech;Difficulty expressing self;Other (comment)  Cognition Arousal: Alert Behavior During Therapy: WFL for tasks assessed/performed   PT - Cognitive impairments: No apparent impairments                       PT - Cognition Comments: Pt is very lethargic in presentation. She did not present like this last session observed by author. Ortho PA/MD made aware of change in status. Discontinued tramadol  after session Following commands: Impaired Following  commands impaired: Follows one step commands with increased time    Cueing Cueing Techniques: Verbal cues, Tactile cues  Exercises Total Joint Exercises Ankle Circles/Pumps: AROM, Strengthening, Both, 10 reps Quad Sets: AROM, Strengthening, 10 reps, Left Long Arc Quad: AROM, Strengthening, Left, 10 reps Goniometric  ROM:  (0-100 in sitting)    General Comments General comments (skin integrity, edema, etc.):  (Purewick applied by nursing due to receiving 2 diuretics, however canister placed on intermittent and required hygiene assist. Right TEDs removed, nursing called in to address L LE.)      Pertinent Vitals/Pain Pain Assessment Pain Assessment: 0-10 Pain Score: 5  Pain Location: L knee Pain Descriptors / Indicators: Sore, Discomfort Pain Intervention(s): Premedicated before session    Home Living                          Prior Function            PT Goals (current goals can now be found in the care plan section) Acute Rehab PT Goals Patient Stated Goal: Go to Rehab Progress towards PT goals: Progressing toward goals    Frequency    BID      PT Plan      Co-evaluation              AM-PAC PT 6 Clicks Mobility   Outcome Measure  Help needed turning from your back to your side while in a flat bed without using bedrails?: A Lot Help needed moving from lying on your back to sitting on the side of a flat bed without using bedrails?: A Lot Help needed moving to and from a bed to a chair (including a wheelchair)?: A Little Help needed standing up from a chair using your arms (e.g., wheelchair or bedside chair)?: A Little Help needed to walk in hospital room?: A Little Help needed climbing 3-5 steps with a railing? : A Lot 6 Click Score: 15    End of Session Equipment Utilized During Treatment: Gait belt Activity Tolerance: Patient tolerated treatment well Patient left: in chair;with call bell/phone within reach;with chair alarm set Nurse Communication: Mobility status;Other (comment) (Requesting breathing treatment and TED/ace wrap change L LE) PT Visit Diagnosis: Unsteadiness on feet (R26.81);Other abnormalities of gait and mobility (R26.89);Muscle weakness (generalized) (M62.81)     Time: 8543-8479 PT Time Calculation (min) (ACUTE ONLY): 24  min  Charges:    $Gait Training: 8-22 mins $Therapeutic Exercise: 8-22 mins PT General Charges $$ ACUTE PT VISIT: 1 Visit                    Darice Bohr, PTA  Darice JAYSON Bohr 08/21/2024, 4:13 PM

## 2024-08-21 NOTE — Progress Notes (Signed)
 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 PMH includes: R TKA, HTN, and depression.    PT Comments  Pt received up in chair for PT session, states she feels better today, Tramadol  d/c'd pt with 8/10 pain during session, MD to add another pain med option for pm session. Focused session on L LE strengthening prior to modified gait in room with heavy reliance on RW ~74ft with chair follow for safety due to previous day mentation. Pt without dizziness, limited by L knee pain and fatigue. Will progress gait in pm with new pain meds.    If plan is discharge home, recommend the following: A lot of help with walking and/or transfers;A lot of help with bathing/dressing/bathroom;Assistance with cooking/housework;Direct supervision/assist for medications management;Direct supervision/assist for financial management;Assist for transportation;Help with stairs or ramp for entrance   Can travel by private vehicle     No  Equipment Recommendations  Other (comment) (Defer to next level of care)    Recommendations for Other Services       Precautions / Restrictions       Mobility  Bed Mobility               General bed mobility comments:  (Pt in recliner pre/post session)    Transfers Overall transfer level: Needs assistance Equipment used: Rolling walker (2 wheels) Transfers: Sit to/from Stand Sit to Stand: Mod assist           General transfer comment:  (Pt required increased assist to stand from low recliner this am. No dizziness in standing.)    Ambulation/Gait Ambulation/Gait assistance: Contact guard assist Gait Distance (Feet): 15 Feet Assistive device: Rolling walker (2 wheels) Gait  Pattern/deviations: Step-to pattern, Decreased step length - right, Decreased stance time - left Gait velocity: decreased     General Gait Details: Pt ambulated in room with chair follow due to fatigue   Stairs             Wheelchair Mobility     Tilt Bed    Modified Rankin (Stroke Patients Only)       Balance Overall balance assessment: Needs assistance Sitting-balance support: Feet supported, Bilateral upper extremity supported Sitting balance-Leahy Scale: Fair     Standing balance support: Bilateral upper extremity supported, During functional activity, Reliant on assistive device for balance Standing balance-Leahy Scale: Fair Standing balance comment: reliant on RW for all dynamic standing activity                            Communication Communication Communication: Impaired Factors Affecting Communication: Reduced clarity of speech;Difficulty expressing self;Other (comment)  Cognition Arousal: Alert Behavior During Therapy: WFL for tasks assessed/performed   PT - Cognitive impairments: No apparent impairments                       PT - Cognition Comments: Pt is very lethargic in presentation. She did not present like this last session observed by author. Ortho PA/MD made aware of change in status. Discontinued tramadol  after session Following commands: Impaired Following commands impaired: Follows one step commands with increased time    Cueing    Exercises Total Joint Exercises Ankle  Circles/Pumps: AROM, Strengthening, Both, 10 reps Quad Sets: AROM, Strengthening, 10 reps, Left Long Arc Quad: AROM, Strengthening, Left, 10 reps    General Comments General comments (skin integrity, edema, etc.):  (Adjusted and re-wrapped L LE ace wrap)      Pertinent Vitals/Pain Pain Assessment Pain Assessment: 0-10 Pain Score: 8  Pain Location: L knee Pain Descriptors / Indicators: Sore Pain Intervention(s): Monitored during session     Home Living                          Prior Function            PT Goals (current goals can now be found in the care plan section) Progress towards PT goals: Progressing toward goals    Frequency    BID      PT Plan      Co-evaluation              AM-PAC PT 6 Clicks Mobility   Outcome Measure  Help needed turning from your back to your side while in a flat bed without using bedrails?: A Lot Help needed moving from lying on your back to sitting on the side of a flat bed without using bedrails?: A Lot Help needed moving to and from a bed to a chair (including a wheelchair)?: A Little Help needed standing up from a chair using your arms (e.g., wheelchair or bedside chair)?: A Little Help needed to walk in hospital room?: A Little Help needed climbing 3-5 steps with a railing? : A Lot 6 Click Score: 15    End of Session Equipment Utilized During Treatment: Gait belt Activity Tolerance: Patient tolerated treatment well;Patient limited by fatigue Patient left: in chair;with call bell/phone within reach;with chair alarm set Nurse Communication: Mobility status PT Visit Diagnosis: Unsteadiness on feet (R26.81);Other abnormalities of gait and mobility (R26.89);Muscle weakness (generalized) (M62.81)     Time: 8960-8889 PT Time Calculation (min) (ACUTE ONLY): 31 min  Charges:    $Gait Training: 8-22 mins $Therapeutic Exercise: 8-22 mins PT General Charges $$ ACUTE PT VISIT: 1 Visit                    Darice Bohr, PTA  Darice JAYSON Bohr 08/21/2024, 4:03 PM

## 2024-08-21 NOTE — Discharge Instructions (Addendum)
Instructions after Total Knee Revision   Reinaldo Berber M.D.     Dept. of Orthopaedics & Sports Medicine  [facility]  [address]  [place], [place]  [address]  Phone: [phone]   Fax: [phone]    DIET: Drink plenty of non-alcoholic fluids. Resume your normal diet. Include foods high in fiber.  ACTIVITY:  You may use crutches or a walker with weight-bearing as tolerated, unless instructed otherwise. You may be weaned off of the walker or crutches by your Physical Therapist.  Do NOT place pillows under the knee. Anything placed under the knee could limit your ability to straighten the knee.   Continue doing gentle exercises. Exercising will reduce the pain and swelling, increase motion, and prevent muscle weakness.   Please continue to use the TED compression stockings for 2 weeks. You may remove the stockings at night, but should reapply them in the morning. Do not drive or operate any equipment until instructed.  WOUND CARE:  Continue to use the PolarCare or ice packs periodically to reduce pain and swelling. You may begin showering 3 days after surgery with honeycomb dressing. Remove honeycomb dressing 7 days after surgery and continue showering. Allow dermabond to fall off on its own.  MEDICATIONS: You may resume your regular medications. Please take the pain medication as prescribed on the medication. Do not take pain medication on an empty stomach. You have been given a prescription for a blood thinner (Lovenox or Coumadin). Please take the medication as instructed. (NOTE: After completing a 2 week course of Lovenox, take one 81 mg Enteric-coated aspirin twice a day for 3 additional weeks. This along with elevation will help reduce the possibility of phlebitis in your operated leg.) Do not drive or drink alcoholic beverages when taking pain medications.  POSTOPERATIVE CONSTIPATION PROTOCOL Constipation - defined medically as fewer than three stools per  week and severe constipation as less than one stool per week.  One of the most common issues patients have following surgery is constipation.  Even if you have a regular bowel pattern at home, your normal regimen is likely to be disrupted due to multiple reasons following surgery.  Combination of anesthesia, postoperative narcotics, change in appetite and fluid intake all can affect your bowels.  In order to avoid complications following surgery, here are some recommendations in order to help you during your recovery period.  Colace (docusate) - Pick up an over-the-counter form of Colace or another stool softener and take twice a day as long as you are requiring postoperative pain medications.  Take with a full glass of water daily.  If you experience loose stools or diarrhea, hold the colace until you stool forms back up.  If your symptoms do not get better within 1 week or if they get worse, check with your doctor.  Dulcolax (bisacodyl) - Pick up over-the-counter and take as directed by the product packaging as needed to assist with the movement of your bowels.  Take with a full glass of water.  Use this product as needed if not relieved by Colace only.   MiraLax (polyethylene glycol) - Pick up over-the-counter to have on hand.  MiraLax is a solution that will increase the amount of water in your bowels to assist with bowel movements.  Take as directed and can mix with a glass of water, juice, soda, coffee, or tea.  Take if you go more than two days without a movement. Do not use MiraLax more than once per day.  Call your doctor if you are still constipated or irregular after using this medication for 7 days in a row.  If you continue to have problems with postoperative constipation, please contact the office for further assistance and recommendations.  If you experience "the worst abdominal pain ever" or develop nausea or vomiting, please contact the office immediatly for further recommendations for  treatment.   CALL THE OFFICE FOR: Temperature above 101 degrees Excessive bleeding or drainage on the dressing. Excessive swelling, coldness, or paleness of the toes. Persistent nausea and vomiting.  FOLLOW-UP:  You should have an appointment to return to the office in 14 days after surgery. Arrangements have been made for continuation of Physical Therapy (either home therapy or outpatient therapy).

## 2024-08-21 NOTE — Care Management Important Message (Signed)
 Important Message  Patient Details  Name: Diana Green MRN: [id number] Date of Birth: April 18, 1951   Important Message Given:  Yes - Medicare IM     Onesimo Lingard W, CMA 08/21/2024, 11:57 AM

## 2024-08-21 NOTE — Plan of Care (Signed)
" °  Problem: Education: Goal: Knowledge of the prescribed therapeutic regimen will improve Outcome: Progressing   Problem: Bowel/Gastric: Goal: Gastrointestinal status for postoperative course will improve Outcome: Progressing   Problem: Cardiac: Goal: Ability to maintain an adequate cardiac output Outcome: Progressing Goal: Will show no evidence of cardiac arrhythmias Outcome: Progressing   Problem: Nutritional: Goal: Will attain and maintain optimal nutritional status Outcome: Progressing   Problem: Neurological: Goal: Will regain or maintain usual level of consciousness Outcome: Progressing   Problem: Education: Goal: Ability to describe self-care measures that may prevent or decrease complications (Diabetes Survival Skills Education) will improve Outcome: Progressing Goal: Individualized Educational Video(s) Outcome: Progressing   Problem: Coping: Goal: Ability to adjust to condition or change in health will improve Outcome: Progressing   Problem: Nutritional: Goal: Maintenance of adequate nutrition will improve Outcome: Progressing Goal: Progress toward achieving an optimal weight will improve Outcome: Progressing   Problem: Skin Integrity: Goal: Risk for impaired skin integrity will decrease Outcome: Progressing   Problem: Tissue Perfusion: Goal: Adequacy of tissue perfusion will improve Outcome: Progressing   Problem: Activity: Goal: Ability to avoid complications of mobility impairment will improve Outcome: Progressing Goal: Range of joint motion will improve Outcome: Progressing   Problem: Clinical Measurements: Goal: Postoperative complications will be avoided or minimized Outcome: Progressing   Problem: Activity: Goal: Ability to avoid complications of mobility impairment will improve Outcome: Progressing Goal: Range of joint motion will improve Outcome: Progressing   Problem: Skin Integrity: Goal: Will show signs of wound healing Outcome:  Progressing   Problem: Pain Management: Goal: Pain level will decrease with appropriate interventions Outcome: Progressing   "

## 2024-08-21 NOTE — TOC Progression Note (Addendum)
 Transition of Care [facility]) - Progression Note    Patient Details  Name: Diana Green MRN: [id number] Date of Birth: September 22, 1950  Transition of Care [facility]) CM/SW Contact  Daved JONETTA Hamilton, RN Phone Number: 08/21/2024, 12:47 PM  Clinical Narrative:     Per prior [facility] documentation, [facility] selected in [place], this CM requested Nitchia with TOC begin prior authorization.   UPDATE 1:15pm:  Received the following message from Nitchia in [facility] I am unable to start auth for this pt because the portal says The current date falls outside of the patient's eligibility dates. You may only create an auth inside of these dates. I attempted to call [facility] and they are closed today.    Nitchia will call again tomorrow when [facility] is open.  UPDATE#2 3:04pm  This CM was notified by PT that patient's choice is [facility]. This has been updated in the HUB and Nitchia has been updated for prior authorization for tomorrow for [facility].   Expected Discharge Plan: Skilled Nursing Facility Barriers to Discharge: Continued Medical Work up               Expected Discharge Plan and Services       Living arrangements for the past 2 months: Single Family Home                                       Social Drivers of Health (SDOH) Interventions SDOH Screenings   Food Insecurity: No Food Insecurity (08/18/2024)  Recent Concern: Food Insecurity - Food Insecurity Present (08/01/2024)   Received from [facility]  Housing: Low Risk (08/18/2024)  Transportation Needs: No Transportation Needs (08/18/2024)  Utilities: Not At Risk (08/18/2024)  Depression (PHQ2-9): Low Risk (08/07/2024)  Financial Resource Strain: High Risk (08/01/2024)   Received from [facility]  Social Connections: Moderately Integrated (08/18/2024)  Tobacco Use: Low Risk (08/18/2024)    Readmission Risk Interventions     No data to display

## 2024-08-21 NOTE — Progress Notes (Signed)
 Addressed patient's concern regarding potency of svn medication she is receiving relating to reduction of wheezing. Patient is noted to have very faint expiratory wheezes prior to first duoneb treatment. Clear post treatment. Audible airway wheezes noted. Explained to patient airway wheezes can be the result of a number of issues, irration from excessive coughing, sinus drainage, reflux,or aspiration, to name a few. Airway wheezes you hear without a stethoscope whereas with lung wheezes, a medical device is needed to assess potency of medication effect pre-post administration. Explained to her that svn txs will not clear up airway wheezes as such is for lower airway issues. Patient states she has been coughing a lot. She has had sore throat and she does have reflux. Advised patient to continue to reach out with provider for answers.  Patient with clear breath sounds upon administration of second duoneb however, airway wheezes remain.

## 2024-08-21 NOTE — TOC Progression Note (Signed)
 Transition of Care [facility]) - Progression Note    Patient Details  Name: Diana Green MRN: [id number] Date of Birth: 1951/07/03  Transition [facility] Care [facility]) CM/SW Contact  Daved JONETTA Hamilton, RN Phone Number: 08/21/2024, 4:32 PM  Clinical Narrative:     Patient can go to [facility] tomorrow if medically ready for discharge.   Expected Discharge Plan: Skilled Nursing Facility Barriers to Discharge: Continued Medical Work up               Expected Discharge Plan and Services       Living arrangements for the past 2 months: Single Family Home                                       Social Drivers of Health (SDOH) Interventions SDOH Screenings   Food Insecurity: No Food Insecurity (08/18/2024)  Recent Concern: Food Insecurity - Food Insecurity Present (08/01/2024)   Received from [facility]  Housing: Low Risk (08/18/2024)  Transportation Needs: No Transportation Needs (08/18/2024)  Utilities: Not At Risk (08/18/2024)  Depression (PHQ2-9): Low Risk (08/07/2024)  Financial Resource Strain: High Risk (08/01/2024)   Received from [facility]  Social Connections: Moderately Integrated (08/18/2024)  Tobacco Use: Low Risk (08/18/2024)    Readmission Risk Interventions     No data to display

## 2024-08-22 LAB — GLUCOSE, CAPILLARY
Glucose-Capillary: 163 mg/dL — ABNORMAL HIGH (ref 70–99)
Glucose-Capillary: 176 mg/dL — ABNORMAL HIGH (ref 70–99)

## 2024-08-22 NOTE — TOC Transition Note (Signed)
 Transition of Care [facility]) - Discharge Note   Patient Details  Name: Diana Green MRN: [id number] Date of Birth: [birth year]  Transition of Care [facility]) CM/SW Contact:  Alvaro Louder, LCSW Phone Number: 08/22/2024, 11:03 AM   Clinical Narrative:   LCSWA received insurance approval for patient to admit to SN[facility]. LCSWA confirmed with MD that patient is stable for discharge. LCSWA notified the patient and daughter and they are in agreement with discharge . LCSWA confirmed bed is available at [facility] Transport arranged with [facility] for next available.    RM [phone], Number to call report [phone]   TOC signing off  Final next level of care: Skilled Nursing Facility Barriers to Discharge: No Barriers Identified   Patient Goals and CMS Choice            Discharge Placement              Patient chooses bed at:  [facility] and rehab) Patient to be transferred to facility by: [facility] Name of family member notified: Self Patient and family notified of of transfer: 08/22/24  Discharge Plan and Services Additional resources added to the After Visit Summary for                                       Social Drivers of Health (SDOH) Interventions SDOH Screenings   Food Insecurity: No Food Insecurity (08/18/2024)  Recent Concern: Food Insecurity - Food Insecurity Present (08/01/2024)   Received from [facility]  Housing: Low Risk (08/18/2024)  Transportation Needs: No Transportation Needs (08/18/2024)  Utilities: Not At Risk (08/18/2024)  Depression (PHQ2-9): Low Risk (08/07/2024)  Financial Resource Strain: High Risk (08/01/2024)   Received from [facility]  Social Connections: Moderately Integrated (08/18/2024)  Tobacco Use: Low Risk (08/18/2024)     Readmission Risk Interventions     No data to display

## 2024-08-22 NOTE — Progress Notes (Signed)
 "  Subjective: 4 Days Post-Op Procedures (LRB): TOTAL KNEE REVISION (Left) Patient reports pain as moderate, Mild.   No cough congestion sore throat or wheezing. Chest x-ray negative for any acute cardiopulmonary process.  She denies any fevers, chills or bodyaches.   Denies any CP, SOB, ABD pain, nausea or vomiting.  Positive bowel movement 08/21/24 We will continue therapy today.  Plan is to go Skilled nursing facility after hospital stay.  Objective: Vital signs in last 24 hours: Temp:  [97.5 F (36.4 C)-98.5 F (36.9 C)] 98 F (36.7 C) (01/02 0809) Pulse Rate:  [60-68] 63 (01/02 0809) Resp:  [16-19] 17 (01/02 0809) BP: (102-139)/(57-72) 118/57 (01/02 0809) SpO2:  [78 %-100 %] 95 % (01/02 0809)  Intake/Output from previous day: 01/01 0701 - 01/02 0700 In: 360 [P.O.:360] Out: 1650 [Urine:1650] Intake/Output this shift: No intake/output data recorded.  Recent Labs    08/19/24 0842 08/20/24 0608  HGB 10.6* 10.4*   Recent Labs    08/19/24 0842 08/20/24 0608  WBC 11.3* 7.5  RBC 3.59* 3.52*  HCT 33.2* 31.8*  PLT 208 202   Recent Labs    08/19/24 0842  NA 139  K 4.8  CL 104  CO2 28  BUN 19  CREATININE 1.35*  GLUCOSE 172*  CALCIUM  9.9   No results for input(s): LABPT, INR in the last 72 hours.  EXAM General - Patient is Alert and Appropriate.  Patient standing well with PT.  No dizziness or lightheadedness Lungs -very subtle expiratory wheezing. Good air movement bilaterally. No rales or rhonchi Heart -regular rate and rhythm Extremity - Neurovascular intact Intact pulses distally Dorsiflexion/Plantar flexion intact No cellulitis present Compartment soft Able to straight leg raise Negative Homans' sign bilaterally Dressing - dressing C/D/I and no drainage Motor Function - intact, moving foot and toes well on exam.   Past Medical History:  Diagnosis Date   And by talking to you go    Arthritis    Cardiomyopathy due to hypertension, with heart  failure (HCC)    CKD (chronic kidney disease), stage III (HCC)    DDD (degenerative disc disease), cervical    a.) s/p ACDF C3-C4 07/08/2021   DDD (degenerative disc disease), thoracolumbar    Depression    Dyspnea    GERD (gastroesophageal reflux disease)    HFrEF (heart failure with reduced ejection fraction) (HCC)    High cholesterol    Hypertension    Iron deficiency anemia 10/09/2022   OSA on CPAP    RBBB (right bundle branch block)    T2DM (type 2 diabetes mellitus) (HCC)    Vitamin D  deficiency 10/09/2022    Assessment/Plan:   4 Days Post-Op Procedures (LRB): TOTAL KNEE REVISION (Left) Principal Problem:   S/P revision of total knee, left   Influenza A  Estimated body mass index is 39.28 kg/m as calculated from the following:   Height as of this encounter: 5' 9 (1.753 m).   Weight as of this encounter: 120.7 kg. Advance diet Up with therapy  Pain well controlled.   Vital signs are stable.    Positive influenza A.  She is now 5-6 days out from symptom onset.  Feeling better.  Cough and congestion resolved.  She has been afebrile 48 hrs.  Chest x-ray negative for any acute cardiopulmonary process. Wheezing significantly improved.  Continue with as needed DuoNeb.  Patient is medically stable for discharge.  Care management to assist with discharge to skilled nursing facility  DVT Prophylaxis - Lovenox ,  TED hose, and SCDs Weight-Bearing as tolerated to left leg   T. Medford Amber, PA-C [facility] 08/22/2024, 8:19 AM   "

## 2024-08-22 NOTE — Plan of Care (Signed)
" °  Problem: Clinical Measurements: Goal: Ability to maintain clinical measurements within normal limits Outcome: Progressing Goal: Postoperative complications will be avoided or minimized Outcome: Progressing   Problem: Respiratory: Goal: Will regain and/or maintain adequate ventilation Outcome: Progressing Goal: Respiratory status will improve Outcome: Progressing   Problem: Pain Management: Goal: Pain level will decrease with appropriate interventions Outcome: Progressing   "

## 2024-08-22 NOTE — Progress Notes (Signed)
 The writer called [facility] to give report to nurse. The writer was told Nurse Satmato will call me back.

## 2024-08-23 LAB — SURGICAL PATHOLOGY

## 2024-09-05 ENCOUNTER — Telehealth: Payer: Self-pay | Admitting: Internal Medicine

## 2024-09-05 NOTE — Telephone Encounter (Signed)
 Patient left VM stating that she is home from rehab now and was started on Mounjaro . Wants to know if she should continue the same dose of Mounjaro .   Need to call patient and see if she is on the 2.5 mg of Mounajro and see how she is doing on it. Her next appt with NK is not until 2/12.

## 2024-09-08 ENCOUNTER — Telehealth: Payer: Self-pay | Admitting: Internal Medicine

## 2024-09-08 ENCOUNTER — Ambulatory Visit: Admitting: Internal Medicine

## 2024-09-08 ENCOUNTER — Ambulatory Visit
Admission: RE | Admit: 2024-09-08 | Discharge: 2024-09-08 | Disposition: A | Discharge status: Home / Self Care | Attending: Internal Medicine | Admitting: Internal Medicine

## 2024-09-08 ENCOUNTER — Ambulatory Visit: Payer: Self-pay | Admitting: Internal Medicine

## 2024-09-08 ENCOUNTER — Encounter: Payer: Self-pay | Admitting: Internal Medicine

## 2024-09-08 ENCOUNTER — Ambulatory Visit
Admission: RE | Admit: 2024-09-08 | Discharge: 2024-09-08 | Disposition: A | Source: Ambulatory Visit | Attending: Internal Medicine | Admitting: Internal Medicine

## 2024-09-08 VITALS — BP 88/60 | HR 91 | Ht 69.0 in | Wt 266.0 lb

## 2024-09-08 DIAGNOSIS — I152 Hypertension secondary to endocrine disorders: Secondary | ICD-10-CM

## 2024-09-08 DIAGNOSIS — M25471 Effusion, right ankle: Secondary | ICD-10-CM

## 2024-09-08 DIAGNOSIS — J111 Influenza due to unidentified influenza virus with other respiratory manifestations: Secondary | ICD-10-CM | POA: Insufficient documentation

## 2024-09-08 DIAGNOSIS — K219 Gastro-esophageal reflux disease without esophagitis: Secondary | ICD-10-CM

## 2024-09-08 DIAGNOSIS — E1159 Type 2 diabetes mellitus with other circulatory complications: Secondary | ICD-10-CM

## 2024-09-08 DIAGNOSIS — E1169 Type 2 diabetes mellitus with other specified complication: Secondary | ICD-10-CM | POA: Diagnosis not present

## 2024-09-08 DIAGNOSIS — Z6839 Body mass index (BMI) 39.0-39.9, adult: Secondary | ICD-10-CM | POA: Diagnosis not present

## 2024-09-08 DIAGNOSIS — E782 Mixed hyperlipidemia: Secondary | ICD-10-CM

## 2024-09-08 DIAGNOSIS — M25472 Effusion, left ankle: Secondary | ICD-10-CM

## 2024-09-08 DIAGNOSIS — E0800 Diabetes mellitus due to underlying condition with hyperosmolarity without nonketotic hyperglycemic-hyperosmolar coma (NKHHC): Secondary | ICD-10-CM | POA: Diagnosis not present

## 2024-09-08 DIAGNOSIS — N1832 Chronic kidney disease, stage 3b: Secondary | ICD-10-CM

## 2024-09-08 DIAGNOSIS — Z96651 Presence of right artificial knee joint: Secondary | ICD-10-CM

## 2024-09-08 DIAGNOSIS — E66812 Obesity, class 2: Secondary | ICD-10-CM

## 2024-09-08 DIAGNOSIS — Z96652 Presence of left artificial knee joint: Secondary | ICD-10-CM

## 2024-09-08 NOTE — Telephone Encounter (Signed)
 Tiffany with [facility] left VM stating that patient wanted to push out her PT eval to Monday, 1/19.    Called [facility] back and gave Harlene verbal okay to see patient today for eval.

## 2024-09-08 NOTE — Progress Notes (Signed)
 "  Established Patient Office Visit  Subjective:  Patient ID: Diana Green, female    DOB: [birth year]  Age: 74 y.o. MRN: [id number]  Chief Complaint  Patient presents with   Follow-up    Rehab follow up    Patient is here today for follow up.  She recently went under revision of Left knee replacement and had FU with orthopedic on 09/02/24. Patient was inpatient rehab due to living alone. Orthopedic specialist wants her to continue out patient rehab at home and she had evaluation completed earlier today. She was inpatient at [facility] place in [place], [place] from 1/2-1/15. She reports she did not get any rehab while she was there because they were short staffed and did not have enough therapists. She is walking with a walker and able to transfer and ambulate without assistance.  She recently tested positive for the flu 12/25. Endorses still having non-productive cough, feeling tired, wheezing. She reports she only treated her symptoms with Flonase . She did not take any OTC cold/flu medications. Denies any fever and chills at this time. Will order chest xray.  Her BP is low today. She reports it was 130/80's at home around 11 am today. She reports she took all her medications as prescribed. She reports she has only had some cereal so far today. She is having left lower extremity edema. She states she has not been keeping her leg elevated. She has been doing her physical therapy exercise at home since being discharged from rehab. Hold Losartan  and lasix  at this time due to hypotension. Continue other medications as prescribed at this time.  She reports she started Mounjaro  last week while in rehab and is due for her second injection today. She reports appetite suppression but no other abdominal complaints at this time. Patient reports she does not have any injections at home. Upon chart review appears as if 1 month supply was dispensed on 08/22/24 to [facility]. She states they did not give her her  remaining doses at time of discharge. She is not quite due for labs at this time. Will order future routine labs to be completed 09/21/24 but will repeat PTH and vitamin D  when she returns later this week to determine if she needs thyroid US  completed.    No other concerns at this time  Past Surgical History:  Procedure Laterality Date   ANTERIOR CERVICAL DECOMP/DISCECTOMY FUSION N/A 07/08/2021   Procedure: C3-4 ANTERIOR CERVICAL DECOMPRESSION/DISCECTOMY FUSION 1 LEVEL WITH HEDRON;  Surgeon: Clois Fret, MD;  Location: [facility] ORS;  Service: Neurosurgery;  Laterality: N/A;   APPLICATION OF INTRAOPERATIVE CT SCAN N/A 09/19/2021   Procedure: APPLICATION OF INTRAOPERATIVE CT SCAN;  Surgeon: Clois Fret, MD;  Location: [facility] ORS;  Service: Neurosurgery;  Laterality: N/A;   APPLICATION OF WOUND VAC  09/19/2021   Procedure: APPLICATION OF WOUND VAC;  Surgeon: Clois Fret, MD;  Location: [facility] ORS;  Service: Neurosurgery;;  [facility]   BACK SURGERY     thoracic   CESAREAN SECTION  1971   CHOLECYSTECTOMY     COLONOSCOPY     ROTATOR CUFF REPAIR Right 2006   TOTAL KNEE ARTHROPLASTY Left 2014   TOTAL KNEE ARTHROPLASTY Right 09/11/2022   Procedure: TOTAL KNEE ARTHROPLASTY;  Surgeon: Lorelle Hussar, MD;  Location: [facility] ORS;  Service: Orthopedics;  Laterality: Right;   TOTAL KNEE REVISION Left 08/18/2024   Procedure: TOTAL KNEE REVISION;  Surgeon: Lorelle Hussar, MD;  Location: [facility] ORS;  Service: Orthopedics;  Laterality: Left;   TUBAL  LIGATION  1975    Social History   Socioeconomic History   Marital status: Widowed    Spouse name: Not on file   Number of children: Not on file   Years of education: Not on file   Highest education level: Not on file  Occupational History   Not on file  Tobacco Use   Smoking status: Never   Smokeless tobacco: Never  Vaping Use   Vaping status: Never Used  Substance and Sexual Activity   Alcohol use: Not Currently    Comment: rarely    Drug use: No   Sexual activity: Not Currently    Birth control/protection: Post-menopausal  Other Topics Concern   Not on file  Social History Narrative   Lives alone   Social Drivers of Health   Tobacco Use: Low Risk (09/08/2024)   Patient History    Smoking Tobacco Use: Never    Smokeless Tobacco Use: Never    Passive Exposure: Not on file  Financial Resource Strain: High Risk (09/02/2024)   Received [facility]   Overall Financial Resource Strain (CARDIA)    Difficulty of Paying Living Expenses: Hard  Food Insecurity: Food Insecurity Present (09/02/2024)   Received from [facility]   Epic    Within the past 12 months, you worried that your food would run out before you got the money to buy more.: Sometimes true    Within the past 12 months, the food you bought just didn't last and you didn't have money to get more.: Sometimes true  Transportation Needs: No Transportation Needs (09/02/2024)   Received from  [facility]    In the past 12 months, has lack of transportation kept you from medical appointments or from getting medications?: No    Lack of Transportation (Non-Medical): No  Physical Activity: Not on file  Stress: Not on file  Social Connections: Moderately Integrated (08/18/2024)   Social Connection and Isolation Panel    Frequency of Communication with Friends and Family: More than three times a week    Frequency of Social Gatherings with Friends and Family: More than three times a week    Attends Religious Services: More than 4 times per year    Active Member of [employer] or Organizations: Yes    Attends Banker Meetings: 1 to 4 times per year    Marital Status: Widowed  Intimate Partner Violence: Not At Risk (08/18/2024)   Epic    Fear of Current or Ex-Partner: No    Emotionally Abused: No    Physically Abused: No    Sexually Abused: No  Depression (PHQ2-9): Low Risk  (08/07/2024)   Depression (PHQ2-9)    PHQ-2 Score: 4  Alcohol Screen: Not on file  Housing: Low Risk  (09/02/2024)   Received from [facility]   Epic    In the last 12 months, was there a time when you were not able to pay the mortgage or rent on time?: No    In the past 12 months, how many times have you moved where you were living?: 0    At any time in the past 12 months, were you homeless or living in a shelter (including now)?: No  Utilities: Not At Risk (09/02/2024)   Received from [facility]   Epic    In the past 12 months has the electric, gas, oil, or water  company threatened to shut off  services in your home?: No  Health Literacy: Not on file    Review of Systems  Constitutional:  Positive for malaise/fatigue. Negative for chills and fever.  HENT: Negative.  Negative for congestion and sore throat.   Eyes: Negative.  Negative for blurred vision and pain.  Respiratory:  Positive for cough and wheezing. Negative for shortness of breath.   Cardiovascular:  Positive for leg swelling (left leg). Negative for chest pain and palpitations.  Gastrointestinal: Negative.  Negative for abdominal pain, blood in stool, constipation, diarrhea, heartburn, melena, nausea and vomiting.  Genitourinary: Negative.  Negative for dysuria, flank pain, frequency and urgency.  Musculoskeletal:  Positive for joint pain (left knee). Negative for myalgias.  Skin: Negative.   Neurological: Negative.  Negative for dizziness, tingling, sensory change, weakness and headaches.  Endo/Heme/Allergies: Negative.   Psychiatric/Behavioral: Negative.  Negative for depression and suicidal ideas. The patient is not nervous/anxious.        Objective:   BP (!) 88/60   Pulse 91   Ht 5' 9 (1.753 m)   Wt 266 lb (120.7 kg)   SpO2 95%   BMI 39.28 kg/m   Vitals:   09/08/24 1352  BP: (!) 88/60  Pulse: 91  Height: 5' 9 (1.753 m)  Weight: 266 lb (120.7 kg)  SpO2: 95%  BMI  (Calculated): 39.26    Physical Exam Vitals and nursing note reviewed.  Constitutional:      Appearance: Normal appearance.  HENT:     Head: Normocephalic and atraumatic.     Nose: Nose normal.     Mouth/Throat:     Mouth: Mucous membranes are moist.     Pharynx: Oropharynx is clear.  Eyes:     Conjunctiva/sclera: Conjunctivae normal.     Pupils: Pupils are equal, round, and reactive to light.  Cardiovascular:     Rate and Rhythm: Normal rate and regular rhythm.     Pulses: Normal pulses.     Heart sounds: Normal heart sounds. No murmur heard. Pulmonary:     Effort: Pulmonary effort is normal.     Breath sounds: Examination of the left-middle field reveals rhonchi. Decreased breath sounds and rhonchi present. No wheezing.  Abdominal:     General: Bowel sounds are normal.     Palpations: Abdomen is soft.     Tenderness: There is no abdominal tenderness. There is no right CVA tenderness or left CVA tenderness.  Musculoskeletal:        General: Normal range of motion.     Cervical back: Normal range of motion.     Left knee: Swelling present.     Right lower leg: No edema.     Left lower leg: 2+ Pitting Edema present.  Skin:    General: Skin is warm and dry.  Neurological:     General: No focal deficit present.     Mental Status: She is alert and oriented to person, place, and time.  Psychiatric:        Mood and Affect: Mood normal. Affect is flat.        Speech: Speech is delayed.        Behavior: Behavior is slowed.     No results found for any visits on 09/08/24.   Assessment & Plan:  Hold Losartan  and Lasix  at this time. Continue other medications as prescribed. Check routine labs when fasting around 09/21/24. Chest xray today. Keep specialist appointments as scheduled. Reinforced healthy diet and staying active. Drink plenty of water  daily. Problem  List Items Addressed This Visit       Cardiovascular and Mediastinum   Hypertension associated with diabetes (HCC) -  Primary   Relevant Orders   CMP14+EGFR   CBC with Diff     Respiratory   URI due to influenza   Relevant Orders   DG Chest 2 View (Completed)     Digestive   Gastroesophageal reflux disease without esophagitis   Relevant Orders   CBC with Diff     Endocrine   Diabetes mellitus due to underlying condition with hyperosmolarity without coma, without long-term current use of insulin  (HCC)   Relevant Orders   Hemoglobin A1c   Combined hyperlipidemia associated with type 2 diabetes mellitus (HCC)   Relevant Orders   Lipid Panel w/o Chol/HDL Ratio     Genitourinary   CKD stage 3b, GFR 30-44 ml/min (HCC)     Other   S/P TKR (total knee replacement) using cement, right   Ankle edema, bilateral   Relevant Orders   CBC with Diff   Class 2 severe obesity due to excess calories with serious comorbidity and body mass index (BMI) of 39.0 to 39.9 in adult   S/P revision of total knee, left    Return in about 4 days (around 09/12/2024).   Total time spent: 30 minutes. This time includes review of previous notes and results and patient face to face interaction during today's visit.    FERNAND FREDY RAMAN, MD  09/08/2024   This document may have been prepared by [facility] Voice Recognition software and as such may include unintentional dictation errors.   "

## 2024-09-10 ENCOUNTER — Other Ambulatory Visit: Payer: Self-pay

## 2024-09-10 DIAGNOSIS — Z6839 Body mass index (BMI) 39.0-39.9, adult: Secondary | ICD-10-CM

## 2024-09-10 DIAGNOSIS — E0800 Diabetes mellitus due to underlying condition with hyperosmolarity without nonketotic hyperglycemic-hyperosmolar coma (NKHHC): Secondary | ICD-10-CM

## 2024-09-10 DIAGNOSIS — G4733 Obstructive sleep apnea (adult) (pediatric): Secondary | ICD-10-CM

## 2024-09-10 MED ORDER — MOUNJARO 2.5 MG/0.5ML ~~LOC~~ SOAJ: 2.5000 mg | SUBCUTANEOUS | 1 refills | Status: AC

## 2024-09-12 ENCOUNTER — Ambulatory Visit

## 2024-09-12 VITALS — BP 118/76 | HR 60 | Ht 69.0 in

## 2024-09-12 DIAGNOSIS — Z96652 Presence of left artificial knee joint: Secondary | ICD-10-CM

## 2024-09-12 DIAGNOSIS — E782 Mixed hyperlipidemia: Secondary | ICD-10-CM

## 2024-09-12 DIAGNOSIS — R5383 Other fatigue: Secondary | ICD-10-CM

## 2024-09-12 DIAGNOSIS — E1159 Type 2 diabetes mellitus with other circulatory complications: Secondary | ICD-10-CM

## 2024-09-12 DIAGNOSIS — N1832 Chronic kidney disease, stage 3b: Secondary | ICD-10-CM

## 2024-09-12 DIAGNOSIS — E1169 Type 2 diabetes mellitus with other specified complication: Secondary | ICD-10-CM

## 2024-09-12 DIAGNOSIS — E66812 Obesity, class 2: Secondary | ICD-10-CM

## 2024-09-12 DIAGNOSIS — I152 Hypertension secondary to endocrine disorders: Secondary | ICD-10-CM

## 2024-09-12 DIAGNOSIS — Z6839 Body mass index (BMI) 39.0-39.9, adult: Secondary | ICD-10-CM

## 2024-09-12 DIAGNOSIS — I5021 Acute systolic (congestive) heart failure: Secondary | ICD-10-CM

## 2024-09-12 DIAGNOSIS — R001 Bradycardia, unspecified: Secondary | ICD-10-CM

## 2024-09-12 NOTE — Assessment & Plan Note (Addendum)
-   Reinforced healthy diet and exercise as tolerated. - Continue holding Losartan  and lasix . - Hold Carvedilol  at this time. - Continue medications as prescribed. - Advised go to ED if pulse remains below 60 and her symptoms persist or worsen.

## 2024-09-12 NOTE — Progress Notes (Signed)
 " Established Patient Office Visit  Subjective:  Patient ID: Diana Green, female    DOB: Nov 12, 1950  Age: 74 y.o. MRN: [id number]  Chief Complaint  Patient presents with   Follow-up    4 day follow up   Patient is here today for her follow up.  She has been feeling poorly since last appointment.   She does have additional concerns to discuss today. Patient reports feeling increased fatigue. Denies dizziness or lightheadedness. Her BP has improved today since holding her medications but her pulse is low. Will hold Carvedilol  at this time. She took the morning dose already today. Advised patient if pulse remained low after holding Carvedilol  and she still felt fatigued or new symptoms emerged to go to the Emergency room. Patient is able to check vitals at home. Recommend her pulse be >60 based off her baseline.  Patient was provided sample box of Mounjaro  2.5 mg weekly injection as she was unable to get her remaining prescription from [facility]. Reports she began at home PT this week and they are coming twice weekly.  Labs are not due today.  She does not need refills.   I have reviewed her active problem list, medication list, allergies, family history, social history, health maintenance, notes from last encounter, lab results for her appointment today.   No other concerns at this time.   Past Medical History:  Diagnosis Date   And by talking to you go    Arthritis    Cardiomyopathy due to hypertension, with heart failure (HCC)    CKD (chronic kidney disease), stage III (HCC)    DDD (degenerative disc disease), cervical    a.) s/p ACDF C3-C4 07/08/2021   DDD (degenerative disc disease), thoracolumbar    Depression    Dyspnea    GERD (gastroesophageal reflux disease)    HFrEF (heart failure with reduced ejection fraction) (HCC)    High cholesterol    Hypertension    Iron deficiency anemia 10/09/2022   OSA on CPAP    RBBB (right bundle branch block)    T2DM (type 2 diabetes  mellitus) (HCC)    Vitamin D  deficiency 10/09/2022    Past Surgical History:  Procedure Laterality Date   ANTERIOR CERVICAL DECOMP/DISCECTOMY FUSION N/A 07/08/2021   Procedure: C3-4 ANTERIOR CERVICAL DECOMPRESSION/DISCECTOMY FUSION 1 LEVEL WITH HEDRON;  Surgeon: Clois Fret, MD;  Location: [facility] ORS;  Service: Neurosurgery;  Laterality: N/A;   APPLICATION OF INTRAOPERATIVE CT SCAN N/A 09/19/2021   Procedure: APPLICATION OF INTRAOPERATIVE CT SCAN;  Surgeon: Clois Fret, MD;  Location: [facility] OR[facility];  Service: Neurosurgery;  Laterality: N/A;   APPLICATION OF WOUND VAC  09/19/2021   Procedure: APPLICATION OF WOUND VAC;  Surgeon: Clois Fret, MD;  Location: [facility] OR[facility];  Service: Neurosurgery;;  [facility]   BACK SURGERY     thoracic   CESAREAN SECTION  1971   CHOLECYSTECTOMY     COLONOSCOPY     ROTATOR CUFF REPAIR Right 2006   TOTAL KNEE ARTHROPLASTY Left 2014   TOTAL KNEE ARTHROPLASTY Right 09/11/2022   Procedure: TOTAL KNEE ARTHROPLASTY;  Surgeon: Lorelle Hussar, MD;  Location: [facility] OR[facility];  Service: Orthopedics;  Laterality: Right;   TOTAL KNEE REVISION Left 08/18/2024   Procedure: TOTAL KNEE REVISION;  Surgeon: Lorelle Hussar, MD;  Location: [facility];  Service: Orthopedics;  Laterality: Left;   TUBAL LIGATION  1975    Social History   Socioeconomic History   Marital status: Widowed    Spouse name: Not on file  Number of children: Not on file   Years of education: Not on file   Highest education level: Not on file  Occupational History   Not on file  Tobacco Use   Smoking status: Never   Smokeless tobacco: Never  Vaping Use   Vaping status: Never Used  Substance and Sexual Activity   Alcohol use: Not Currently    Comment: rarely   Drug use: No   Sexual activity: Not Currently    Birth control/protection: Post-menopausal  Other Topics Concern   Not on file  Social History Narrative   Lives alone   Social Drivers of Health   Tobacco Use: Low Risk  (09/12/2024)   Patient History    Smoking Tobacco Use: Never    Smokeless Tobacco Use: Never    Passive Exposure: Not on file  Financial Resource Strain: High Risk (09/02/2024)   Received from [facility]   Overall Financial Resource Strain (CARDIA)    Difficulty of Paying Living Expenses: Hard  Food Insecurity: Food Insecurity Present (09/02/2024)   Received from [facility]   Epic    Within the past 12 months, you worried that your food would run out before you got the money to buy more.: Sometimes true    Within the past 12 months, the food you bought just didn't last and you didn't have money to get more.: Sometimes true  Transportation Needs: No Transportation Needs (09/02/2024)   Received from [facility] - Transportation    In the past 12 months, has lack of transportation kept you from medical appointments or from getting medications?: No    Lack of Transportation (Non-Medical): No  Physical Activity: Not on file  Stress: Not on file  Social Connections: Moderately Integrated (08/18/2024)   Social Connection and Isolation Panel    Frequency of Communication with Friends and Family: More than three times a week    Frequency of Social Gatherings with Friends and Family: More than three times a week    Attends Religious Services: More than 4 times per year    Active Member of [employer] or Organizations: Yes    Attends Banker Meetings: 1 to 4 times per year    Marital Status: Widowed  Intimate Partner Violence: Not At Risk (08/18/2024)   Epic    Fear of Current or Ex-Partner: No    Emotionally Abused: No    Physically Abused: No    Sexually Abused: No  Depression (PHQ2-9): Low Risk (08/07/2024)   Depression (PHQ2-9)    PHQ-2 Score: 4  Alcohol Screen: Not on file  Housing: Low Risk  (09/02/2024)   Received from [facility]   Epic    In the last 12 months, was there a time when you were  not able to pay the mortgage or rent on time?: No    In the past 12 months, how many times have you moved where you were living?: 0    At any time in the past 12 months, were you homeless or living in a shelter (including now)?: No  Utilities: Not At Risk (09/02/2024)   Received from [facility]   Epic    In the past 12 months has the electric, gas, oil, or water  company threatened to shut off services in your home?: No  Health Literacy: Not on file    History reviewed. No pertinent family history.  Allergies[1]  Review of Systems  Constitutional:  Positive for malaise/fatigue.  HENT: Negative.    Eyes:  Negative for blurred vision and pain.  Respiratory:  Negative for cough and shortness of breath.   Cardiovascular:  Negative for chest pain, palpitations, claudication and leg swelling.  Gastrointestinal:  Negative for abdominal pain, blood in stool, constipation, diarrhea, nausea and vomiting.  Genitourinary:  Negative for dysuria, frequency and urgency.  Musculoskeletal: Negative.   Skin: Negative.   Neurological:  Negative for dizziness, tingling, sensory change and headaches.  Endo/Heme/Allergies: Negative.   Psychiatric/Behavioral: Negative.     Objective:   BP 118/76   Pulse 60   Ht 5' 9 (1.753 m)   SpO2 96%   BMI 39.28 kg/m   Vitals:   09/12/24 1425 09/12/24 1442  BP: 118/76   Pulse: (!) 50 60  Height: 5' 9 (1.753 m)   Weight: Comment: unable to weigh   SpO2: 96%     Physical Exam Vitals and nursing note reviewed.  Constitutional:      Appearance: Normal appearance.  HENT:     Head: Normocephalic.  Eyes:     Extraocular Movements: Extraocular movements intact.     Pupils: Pupils are equal, round, and reactive to light.  Cardiovascular:     Rate and Rhythm: Regular rhythm. Bradycardia present.     Pulses: Normal pulses.     Heart sounds: Normal heart sounds. No murmur heard. Pulmonary:     Effort: Pulmonary effort is normal. No  respiratory distress.     Breath sounds: Normal breath sounds.  Abdominal:     General: There is no distension.     Tenderness: There is no abdominal tenderness.  Musculoskeletal:        General: No tenderness. Normal range of motion.     Cervical back: Normal range of motion and neck supple.     Right lower leg: No edema.     Left lower leg: No edema.  Skin:    General: Skin is warm and dry.     Coloration: Skin is not jaundiced.     Findings: No erythema.  Neurological:     General: No focal deficit present.     Mental Status: She is alert and oriented to person, place, and time.  Psychiatric:        Mood and Affect: Mood normal.        Speech: Speech normal.        Behavior: Behavior is cooperative.        Cognition and Memory: Memory is not impaired.    No results found for any visits on 09/12/24.  Recent Results (from the past 2160 hours)  CMP14+EGFR     Status: Abnormal   Collection Time: 06/20/24 12:08 PM  Result Value Ref Range   Glucose 91 70 - 99 mg/dL   BUN 28 (H) 8 - 27 mg/dL   Creatinine, Ser 8.56 (H) 0.57 - 1.00 mg/dL   eGFR 39 (L) >40 fO/fpw/8.26   BUN/Creatinine Ratio 20 12 - 28   Sodium 141 134 - 144 mmol/L   Potassium 4.8 3.5 - 5.2 mmol/L   Chloride 101 96 - 106 mmol/L   CO2 25 20 - 29 mmol/L   Calcium  11.0 (H) 8.7 - 10.3 mg/dL   Total Protein 7.5 6.0 - 8.5 g/dL   Albumin 4.4 3.8 - 4.8 g/dL   Globulin, Total 3.1 1.5 - 4.5 g/dL   Bilirubin Total 0.3 0.0 - 1.2 mg/dL   Alkaline Phosphatase 69 49 - 135  IU/L   AST 29 0 - 40 IU/L   ALT 31 0 - 32 IU/L  Lipid Panel w/o Chol/HDL Ratio     Status: None   Collection Time: 06/20/24 12:08 PM  Result Value Ref Range   Cholesterol, Total 147 100 - 199 mg/dL   Triglycerides 890 0 - 149 mg/dL   HDL 55 >60 mg/dL   VLDL Cholesterol Cal 20 5 - 40 mg/dL   LDL Chol Calc (NIH) 72 0 - 99 mg/dL  CBC with Diff     Status: None   Collection Time: 06/20/24 12:08 PM  Result Value Ref Range   WBC 5.2 3.4 - 10.8 x10E3/uL    RBC 4.01 3.77 - 5.28 x10E6/uL   Hemoglobin 11.6 11.1 - 15.9 g/dL   Hematocrit 63.4 65.9 - 46.6 %   MCV 91 79 - 97 fL   MCH 28.9 26.6 - 33.0 pg   MCHC 31.8 31.5 - 35.7 g/dL   RDW 87.0 88.2 - 84.5 %   Platelets 241 150 - 450 x10E3/uL   Neutrophils 58 Not Estab. %   Lymphs 28 Not Estab. %   Monocytes 9 Not Estab. %   Eos 4 Not Estab. %   Basos 1 Not Estab. %   Neutrophils Absolute 3.0 1.4 - 7.0 x10E3/uL   Lymphocytes Absolute 1.5 0.7 - 3.1 x10E3/uL   Monocytes Absolute 0.5 0.1 - 0.9 x10E3/uL   EOS (ABSOLUTE) 0.2 0.0 - 0.4 x10E3/uL   Basophils Absolute 0.0 0.0 - 0.2 x10E3/uL   Immature Granulocytes 0 Not Estab. %   Immature Grans (Abs) 0.0 0.0 - 0.1 x10E3/uL  Hemoglobin A1c     Status: Abnormal   Collection Time: 06/20/24 12:08 PM  Result Value Ref Range   Hgb A1c MFr Bld 6.1 (H) 4.8 - 5.6 %    Comment:          Prediabetes: 5.7 - 6.4          Diabetes: >6.4          Glycemic control for adults with diabetes: <7.0    Est. average glucose Bld gHb Est-mCnc 128 mg/dL  POCT CBG (Fasting - Glucose)     Status: Abnormal   Collection Time: 07/08/24 11:25 AM  Result Value Ref Range   Glucose Fasting, POC 127 (A) 70 - 99 mg/dL  POC CREATINE & ALBUMIN,URINE     Status: Normal   Collection Time: 08/07/24 11:51 AM  Result Value Ref Range   Microalbumin Ur, POC 30 mg/L   Creatinine, POC 200 mg/dL   Albumin/Creatinine Ratio, Urine, POC <30   Surgical pcr screen     Status: None   Collection Time: 08/11/24 11:12 AM   Specimen: Nasal Mucosa; Nasal Swab  Result Value Ref Range   MRSA, PCR NEGATIVE NEGATIVE   Staphylococcus aureus NEGATIVE NEGATIVE    Comment: (NOTE) The Xpert SA Assay (FDA approved for NASAL specimens in patients 51 years of age and older), is one component of a comprehensive surveillance program. It is not intended to diagnose infection nor to guide or monitor treatment. Performed at [facility], [address] [place], [place] [address]   Type and  screen [facility]     Status: None   Collection Time: 08/11/24 11:12 AM  Result Value Ref Range   ABO/RH(D) A POS    Antibody Screen NEG    Sample Expiration 08/25/2024,2359    Extend sample reason      NO TRANSFUSIONS OR  PREGNANCY IN THE PAST 3 MONTHS Performed at [facility], [address] [place], [place] [address]   Urinalysis, Complete w Microscopic -Urine, Clean Catch     Status: Abnormal   Collection Time: 08/12/24  9:36 AM  Result Value Ref Range   Color, Urine STRAW (A) YELLOW   APPearance CLEAR (A) CLEAR   Specific Gravity, Urine 1.003 (L) 1.005 - 1.030   pH 6.0 5.0 - 8.0   Glucose, UA >=500 (A) NEGATIVE mg/dL   Hgb urine dipstick NEGATIVE NEGATIVE   Bilirubin Urine NEGATIVE NEGATIVE   Ketones, ur NEGATIVE NEGATIVE mg/dL   Protein, ur NEGATIVE NEGATIVE mg/dL   Nitrite NEGATIVE NEGATIVE   Leukocytes,Ua NEGATIVE NEGATIVE   RBC / HPF 0-5 0 - 5 RBC/hpf   WBC, UA 0 0 - 5 WBC/hpf   Bacteria, UA RARE (A) NONE SEEN   Squamous Epithelial / HPF 0 0 - 5 /HPF    Comment: Performed at [facility], [address] [place], [place] [address]  Surgical pathology     Status: None   Collection Time: 08/18/24 12:00 AM  Result Value Ref Range   SURGICAL PATHOLOGY      SURGICAL PATHOLOGY [facility] [address], [address] [place], [place] [address] Telephone [phone] or [phone] Fax [phone]  REPORT OF SURGICAL PATHOLOGY   Accession #: [id number] Patient Name: SHERLENE, RICKEL Visit # : [id number]  MRN: [id number] Physician: Lorelle Hussar DOB/Age February 13, 1951 (Age: 19) Gender: F Collected Date: 08/18/2024 Received Date: 08/18/2024  FINAL DIAGNOSIS       1. Synovium, left knee :       - FIBROSYNOVIAL TISSUE WITH REACTIVE CHANGES.      - NEGATIVE FOR ACUTE INFLAMMATION.       DATE SIGNED OUT: 08/23/2024 ELECTRONIC SIGNATURE : Coronel Md, Misti, Pathologist, Electronic Signature  MICROSCOPIC  DESCRIPTION  CASE COMMENTS STAINS USED IN DIAGNOSIS: H&E H&E         Intraoperative Diagnosis: 1 - no neutrophils identified. called to Dr. Lorelle by Dr. Delaine at 1140 on      08-18-24.  CLINICAL HISTORY  SPECIMEN(S) OBTAINED 1. Synovium, Left Knee  SPECIMEN COMMENTS: SPECIMEN CLINI CAL INFORMATION:    Gross Description 1. Received fresh for frozen section and placed in formalin is a 1.7 x 1 x 0.3 cm fragment of tan soft tissue. 50% is frozen in 1A, all remaining tissue in 1B. mb 08-18-24        Report signed out from the following location(s) [place] [address] [place], [place] [address] CLIA #: [id number]  [facility] [address] [place], [place] [address] CLIA #: [id number]   Glucose, capillary     Status: None   Collection Time: 08/18/24  8:57 AM  Result Value Ref Range   Glucose-Capillary 80 70 - 99 mg/dL    Comment: Glucose reference range applies only to samples taken after fasting for at least 8 hours.  Synovial cell count + diff, w/ crystals     Status: None   Collection Time: 08/18/24 11:13 AM  Result Value Ref Range   Color, Synovial YELLOW YELLOW   Appearance-Synovial CLEAR CLEAR   Crystals, Fluid NO CRYSTALS SEEN    WBC, Synovial 70 0 - 200 /cu mm   Neutrophil, Synovial 35 %   Lymphocytes-Synovial Fld 55 %   Monocyte-Macrophage-Synovial Fluid 10 %   Eosinophils-Synovial 0 %    Comment: Performed at [facility], [address] [place], [place] [address]  Glucose, capillary     Status: None   Collection Time: 08/18/24 12:56 PM  Result Value Ref Range   Glucose-Capillary 96 70 - 99 mg/dL    Comment: Glucose reference range applies only to samples taken after fasting for at least 8 hours.  Glucose, capillary     Status: Abnormal   Collection Time: 08/18/24  5:38 PM  Result Value Ref Range   Glucose-Capillary 140 (H) 70 - 99 mg/dL    Comment: Glucose reference range applies only to  samples taken after fasting for at least 8 hours.  Glucose, capillary     Status: Abnormal   Collection Time: 08/18/24  9:53 PM  Result Value Ref Range   Glucose-Capillary 181 (H) 70 - 99 mg/dL    Comment: Glucose reference range applies only to samples taken after fasting for at least 8 hours.  Glucose, capillary     Status: Abnormal   Collection Time: 08/19/24  7:36 AM  Result Value Ref Range   Glucose-Capillary 141 (H) 70 - 99 mg/dL    Comment: Glucose reference range applies only to samples taken after fasting for at least 8 hours.  CBC     Status: Abnormal   Collection Time: 08/19/24  8:42 AM  Result Value Ref Range   WBC 11.3 (H) 4.0 - 10.5 K/uL   RBC 3.59 (L) 3.87 - 5.11 MIL/uL   Hemoglobin 10.6 (L) 12.0 - 15.0 g/dL   HCT 66.7 (L) 63.9 - 53.9 %   MCV 92.5 80.0 - 100.0 fL   MCH 29.5 26.0 - 34.0 pg   MCHC 31.9 30.0 - 36.0 g/dL   RDW 86.6 88.4 - 84.4 %   Platelets 208 150 - 400 K/uL   nRBC 0.0 0.0 - 0.2 %    Comment: Performed at [facility], [address] [place], [place] [address]  Basic metabolic panel     Status: Abnormal   Collection Time: 08/19/24  8:42 AM  Result Value Ref Range   Sodium 139 135 - 145 mmol/L   Potassium 4.8 3.5 - 5.1 mmol/L   Chloride 104 98 - 111 mmol/L   CO2 28 22 - 32 mmol/L   Glucose, Bld 172 (H) 70 - 99 mg/dL    Comment: Glucose reference range applies only to samples taken after fasting for at least 8 hours.   BUN 19 8 - 23 mg/dL   Creatinine, Ser 8.64 (H) 0.44 - 1.00 mg/dL   Calcium  9.9 8.9 - 10.3 mg/dL   GFR, Estimated 41 (L) >60 mL/min    Comment: (NOTE) Calculated using the CKD-EPI Creatinine Equation (2021)    Anion gap 7 5 - 15    Comment: Performed at [facility], [address] [place], [place] [address]  Resp panel by RT-PCR (RSV, Flu A&B, Covid) Anterior Nasal Swab     Status: Abnormal   Collection Time: 08/19/24 10:48 AM   Specimen: Anterior Nasal Swab  Result Value Ref Range   SARS Coronavirus 2 by  RT PCR NEGATIVE NEGATIVE    Comment: (NOTE) SARS-CoV-2 target nucleic acids are NOT DETECTED.  The SARS-CoV-2 RNA is generally detectable in upper respiratory specimens during the acute phase of infection. The lowest concentration of SARS-CoV-2 viral copies this assay can detect is 138 copies/mL. A negative result does not preclude SARS-Cov-2 infection and should not be used as the sole basis for treatment or other patient management decisions. A negative result  may occur with  improper specimen collection/handling, submission of specimen other than nasopharyngeal swab, presence of viral mutation(s) within the areas targeted by this assay, and inadequate number of viral copies(<138 copies/mL). A negative result must be combined with clinical observations, patient history, and epidemiological information. The expected result is Negative.  Fact Sheet for Patients:  bloggercourse.com  Fact Sheet for Healthcare Providers:  seriousbroker.it  This test is no t yet approved or cleared by the [place]  FDA and  has been authorized for detection and/or diagnosis of SARS-CoV-2 by FDA under an Emergency Use Authorization (EUA). This EUA will remain  in effect (meaning this test can be used) for the duration of the COVID-19 declaration under Section 564(b)(1) of the Act, 21 U.S.C.section 360bbb-3(b)(1), unless the authorization is terminated  or revoked sooner.       Influenza A by PCR POSITIVE (A) NEGATIVE   Influenza B by PCR NEGATIVE NEGATIVE    Comment: (NOTE) The Xpert Xpress SARS-CoV-2/FLU/RSV plus assay is intended as an aid in the diagnosis of influenza from Nasopharyngeal swab specimens and should not be used as a sole basis for treatment. Nasal washings and aspirates are unacceptable for Xpert Xpress SARS-CoV-2/FLU/RSV testing.  Fact Sheet for Patients: bloggercourse.com  Fact Sheet for Healthcare  Providers: seriousbroker.it  This test is not yet approved or cleared by the [place]  FDA and has been authorized for detection and/or diagnosis of SARS-CoV-2 by FDA under an Emergency Use Authorization (EUA). This EUA will remain in effect (meaning this test can be used) for the duration of the COVID-19 declaration under Section 564(b)(1) of the Act, 21 U.S.C. section 360bbb-3(b)(1), unless the authorization is terminated or revoked.     Resp Syncytial Virus by PCR NEGATIVE NEGATIVE    Comment: (NOTE) Fact Sheet for Patients: bloggercourse.com  Fact Sheet for Healthcare Providers: seriousbroker.it  This test is not yet approved or cleared by the [place]  FDA and has been authorized for detection and/or diagnosis of SARS-CoV-2 by FDA under an Emergency Use Authorization (EUA). This EUA will remain in effect (meaning this test can be used) for the duration of the COVID-19 declaration under Section 564(b)(1) of the Act, 21 U.S.C. section 360bbb-3(b)(1), unless the authorization is terminated or revoked.  Performed at [facility] [address] [place], [place] [address]   Glucose, capillary     Status: None   Collection Time: 08/19/24 12:30 PM  Result Value Ref Range   Glucose-Capillary 79 70 - 99 mg/dL    Comment: Glucose reference range applies only to samples taken after fasting for at least 8 hours.  Glucose, capillary     Status: Abnormal   Collection Time: 08/19/24  4:19 PM  Result Value Ref Range   Glucose-Capillary 110 (H) 70 - 99 mg/dL    Comment: Glucose reference range applies only to samples taken after fasting for at least 8 hours.  CBC     Status: Abnormal   Collection Time: 08/20/24  6:08 AM  Result Value Ref Range   WBC 7.5 4.0 - 10.5 K/uL   RBC 3.52 (L) 3.87 - 5.11 MIL/uL   Hemoglobin 10.4 (L) 12.0 - 15.0 g/dL   HCT 68.1 (L) 63.9 - 53.9 %   MCV 90.3 80.0 -  100.0 fL   MCH 29.5 26.0 - 34.0 pg   MCHC 32.7 30.0 - 36.0 g/dL   RDW 85.8 88.4 - 84.4 %   Platelets 202 150 - 400 K/uL   nRBC 0.0 0.0 - 0.2 %  Comment: Performed at [facility], [address] [place], [place] [address]  Glucose, capillary     Status: Abnormal   Collection Time: 08/20/24  7:42 AM  Result Value Ref Range   Glucose-Capillary 46 (L) 70 - 99 mg/dL    Comment: Glucose reference range applies only to samples taken after fasting for at least 8 hours.  Glucose, capillary     Status: Abnormal   Collection Time: 08/20/24  8:10 AM  Result Value Ref Range   Glucose-Capillary 54 (L) 70 - 99 mg/dL    Comment: Glucose reference range applies only to samples taken after fasting for at least 8 hours.  Glucose, capillary     Status: Abnormal   Collection Time: 08/20/24  8:33 AM  Result Value Ref Range   Glucose-Capillary 57 (L) 70 - 99 mg/dL    Comment: Glucose reference range applies only to samples taken after fasting for at least 8 hours.  Glucose, capillary     Status: None   Collection Time: 08/20/24  9:03 AM  Result Value Ref Range   Glucose-Capillary 91 70 - 99 mg/dL    Comment: Glucose reference range applies only to samples taken after fasting for at least 8 hours.  Glucose, capillary     Status: None   Collection Time: 08/20/24 11:52 AM  Result Value Ref Range   Glucose-Capillary 98 70 - 99 mg/dL    Comment: Glucose reference range applies only to samples taken after fasting for at least 8 hours.  Glucose, capillary     Status: Abnormal   Collection Time: 08/20/24  2:50 PM  Result Value Ref Range   Glucose-Capillary 102 (H) 70 - 99 mg/dL    Comment: Glucose reference range applies only to samples taken after fasting for at least 8 hours.  Glucose, capillary     Status: None   Collection Time: 08/20/24  8:07 PM  Result Value Ref Range   Glucose-Capillary 95 70 - 99 mg/dL    Comment: Glucose reference range applies only to samples taken after fasting for  at least 8 hours.  Glucose, capillary     Status: Abnormal   Collection Time: 08/21/24  8:16 AM  Result Value Ref Range   Glucose-Capillary 130 (H) 70 - 99 mg/dL    Comment: Glucose reference range applies only to samples taken after fasting for at least 8 hours.  Glucose, capillary     Status: Abnormal   Collection Time: 08/21/24 11:35 AM  Result Value Ref Range   Glucose-Capillary 102 (H) 70 - 99 mg/dL    Comment: Glucose reference range applies only to samples taken after fasting for at least 8 hours.  Glucose, capillary     Status: Abnormal   Collection Time: 08/21/24  4:49 PM  Result Value Ref Range   Glucose-Capillary 131 (H) 70 - 99 mg/dL    Comment: Glucose reference range applies only to samples taken after fasting for at least 8 hours.  Glucose, capillary     Status: None   Collection Time: 08/21/24  9:10 PM  Result Value Ref Range   Glucose-Capillary 98 70 - 99 mg/dL    Comment: Glucose reference range applies only to samples taken after fasting for at least 8 hours.  Glucose, capillary     Status: Abnormal   Collection Time: 08/22/24  8:10 AM  Result Value Ref Range   Glucose-Capillary 163 (H) 70 - 99 mg/dL    Comment: Glucose reference range applies only to samples  taken after fasting for at least 8 hours.  Glucose, capillary     Status: Abnormal   Collection Time: 08/22/24 11:42 AM  Result Value Ref Range   Glucose-Capillary 176 (H) 70 - 99 mg/dL    Comment: Glucose reference range applies only to samples taken after fasting for at least 8 hours.     Assessment & Plan:   Assessment & Plan Hypertension associated with diabetes (HCC) Combined hyperlipidemia associated with type 2 diabetes mellitus (HCC) CHF (congestive heart failure), NYHA class I, acute, systolic (HCC) CKD stage 3b, GFR 30-44 ml/min (HCC) Fatigue, unspecified type S/P revision of total knee, left Bradycardia, drug induced Class 2 severe obesity due to excess calories with serious comorbidity  and body mass index (BMI) of 39.0 to 39.9 in adult - Reinforced healthy diet and exercise as tolerated. - Continue holding Losartan  and lasix . - Hold Carvedilol  at this time. - Continue medications as prescribed. - Advised go to ED if pulse remains below 60 and her symptoms persist or worsen.   Return in about 4 days (around 09/16/2024).   Total time spent: 30 minutes  Oddis DELENA Cain, FNP  09/12/2024  This document may have been prepared by [facility] Voice Recognition software and as such may include unintentional dictation errors.      [1] No Known Allergies  "

## 2024-09-16 ENCOUNTER — Ambulatory Visit: Admitting: Cardiovascular Disease

## 2024-09-17 ENCOUNTER — Ambulatory Visit

## 2024-09-17 VITALS — BP 130/88 | HR 87 | Ht 66.0 in | Wt 262.2 lb

## 2024-09-17 DIAGNOSIS — N1832 Chronic kidney disease, stage 3b: Secondary | ICD-10-CM

## 2024-09-17 DIAGNOSIS — I5021 Acute systolic (congestive) heart failure: Secondary | ICD-10-CM

## 2024-09-17 DIAGNOSIS — E782 Mixed hyperlipidemia: Secondary | ICD-10-CM

## 2024-09-17 DIAGNOSIS — Z6841 Body Mass Index (BMI) 40.0 and over, adult: Secondary | ICD-10-CM

## 2024-09-17 DIAGNOSIS — E1169 Type 2 diabetes mellitus with other specified complication: Secondary | ICD-10-CM

## 2024-09-17 DIAGNOSIS — E1159 Type 2 diabetes mellitus with other circulatory complications: Secondary | ICD-10-CM

## 2024-09-17 DIAGNOSIS — I152 Hypertension secondary to endocrine disorders: Secondary | ICD-10-CM

## 2024-09-17 MED ORDER — LOSARTAN POTASSIUM 25 MG PO TABS: 25.0000 mg | ORAL_TABLET | Freq: Every day | ORAL | 1 refills | Status: AC

## 2024-09-17 NOTE — Assessment & Plan Note (Signed)
-   Reinforced healthy diet and exercise as tolerated. - Start Losartan  25 mg daily. - Continue other medications as prescribed. - Continue holding lasix  at this time. - Check labs not due at this time. Will collect fasting labs when she returns for pre-scheduled FU 10/02/24  - Keep specialist appointments as scheduled.

## 2024-09-17 NOTE — Progress Notes (Signed)
 "  Established Patient Office Visit  Subjective:  Patient ID: Diana Green, female    DOB: [birth year]  Age: 74 y.o. MRN: [id number]  Chief Complaint  Patient presents with   Follow-up    Patient is here today for her follow up.  She has been feeling well since last appointment.   She does not have additional concerns to discuss today. Patient reports her fatigue has improved since stopping her Carvedilol . Her pulse at home she reports has been running 70-80's. Denies chest pain, shortness of breath, palpitations. Her BP is slightly elevated at today's visit. Will restart Losartan  at 25 mg daily. Advised patient to restart Carvedilol  at 12.5 mg (1/2 tablet) if her pulse remains >100. She is scheduled to see Cardiology 09/22/24. Continue other medications as prescribed.Continue to hold lasix  at this time; she denies any edema at this time.  Labs are not due today. She has routine FU scheduled 10/02/24 and will do fasting labs at that appointment. Patient reports decreased appetite since continue Mounjaro . Denies any abdominal distress at this time.  She does not need refills.   I have reviewed her active problem list, medication list, allergies, family history, social history, health maintenance, notes from last encounter, lab results for her appointment today.      No other concerns at this time.   Past Medical History:  Diagnosis Date   And by talking to you go    Arthritis    Cardiomyopathy due to hypertension, with heart failure (HCC)    CKD (chronic kidney disease), stage III (HCC)    DDD (degenerative disc disease), cervical    a.) s/p ACDF C3-C4 07/08/2021   DDD (degenerative disc disease), thoracolumbar    Depression    Dyspnea    GERD (gastroesophageal reflux disease)    HFrEF (heart failure with reduced ejection fraction) (HCC)    High cholesterol    Hypertension    Iron deficiency anemia 10/09/2022   OSA on CPAP    RBBB (right bundle branch block)    T2DM (type 2  diabetes mellitus) (HCC)    Vitamin D  deficiency 10/09/2022    Past Surgical History:  Procedure Laterality Date   ANTERIOR CERVICAL DECOMP/DISCECTOMY FUSION N/A 07/08/2021   Procedure: C3-4 ANTERIOR CERVICAL DECOMPRESSION/DISCECTOMY FUSION 1 LEVEL WITH HEDRON;  Surgeon: Clois Fret, MD;  Location: [facility] OR[facility];  Service: Neurosurgery;  Laterality: N/A;   APPLICATION OF INTRAOPERATIVE CT SCAN N/A 09/19/2021   Procedure: APPLICATION OF INTRAOPERATIVE CT SCAN;  Surgeon: Clois Fret, MD;  Location: [facility];  Service: Neurosurgery;  Laterality: N/A;   APPLICATION OF WOUND VAC  09/19/2021   Procedure: APPLICATION OF WOUND VAC;  Surgeon: Clois Fret, MD;  Location: [facility];  Service: Neurosurgery;;  [facility]   BACK SURGERY     thoracic   CESAREAN SECTION  1971   CHOLECYSTECTOMY     COLONOSCOPY     ROTATOR CUFF REPAIR Right 2006   TOTAL KNEE ARTHROPLASTY Left 2014   TOTAL KNEE ARTHROPLASTY Right 09/11/2022   Procedure: TOTAL KNEE ARTHROPLASTY;  Surgeon: Lorelle Hussar, MD;  Location: [facility];  Service: Orthopedics;  Laterality: Right;   TOTAL KNEE REVISION Left 08/18/2024   Procedure: TOTAL KNEE REVISION;  Surgeon: Lorelle Hussar, MD;  Location: [facility];  Service: Orthopedics;  Laterality: Left;   TUBAL LIGATION  1975    Social History   Socioeconomic History   Marital status: Widowed    Spouse name: Not on file   Number of children: Not on  file   Years of education: Not on file   Highest education level: Not on file  Occupational History   Not on file  Tobacco Use   Smoking status: Never   Smokeless tobacco: Never  Vaping Use   Vaping status: Never Used  Substance and Sexual Activity   Alcohol use: Not Currently    Comment: rarely   Drug use: No   Sexual activity: Not Currently    Birth control/protection: Post-menopausal  Other Topics Concern   Not on file  Social History Narrative   Lives alone   Social Drivers of Health   Tobacco Use: Low Risk  (09/17/2024)   Patient History    Smoking Tobacco Use: Never    Smokeless Tobacco Use: Never    Passive Exposure: Not on file  Financial Resource Strain: High Risk (09/02/2024)   Received from [facility]   Overall Financial Resource Strain (CARDIA)    Difficulty of Paying Living Expenses: Hard  Food Insecurity: Food Insecurity Present (09/02/2024)   Received from [facility]   Epic    Within the past 12 months, you worried that your food would run out before you got the money to buy more.: Sometimes true    Within the past 12 months, the food you bought just didn't last and you didn't have money to get more.: Sometimes true  Transportation Needs: No Transportation Needs (09/02/2024)   Received from [facility] - Transportation    In the past 12 months, has lack of transportation kept you from medical appointments or from getting medications?: No    Lack of Transportation (Non-Medical): No  Physical Activity: Not on file  Stress: Not on file  Social Connections: Moderately Integrated (08/18/2024)   Social Connection and Isolation Panel    Frequency of Communication with Friends and Family: More than three times a week    Frequency of Social Gatherings with Friends and Family: More than three times a week    Attends Religious Services: More than 4 times per year    Active Member of [employer] or Organizations: Yes    Attends Banker Meetings: 1 to 4 times per year    Marital Status: Widowed  Intimate Partner Violence: Not At Risk (08/18/2024)   Epic    Fear of Current or Ex-Partner: No    Emotionally Abused: No    Physically Abused: No    Sexually Abused: No  Depression (PHQ2-9): Low Risk (08/07/2024)   Depression (PHQ2-9)    PHQ-2 Score: 4  Alcohol Screen: Not on file  Housing: Low Risk  (09/02/2024)   Received from [facility]   Epic    In the last 12 months, was there a time when you were  not able to pay the mortgage or rent on time?: No    In the past 12 months, how many times have you moved where you were living?: 0    At any time in the past 12 months, were you homeless or living in a shelter (including now)?: No  Utilities: Not At Risk (09/02/2024)   Received from [facility]   Epic    In the past 12 months has the electric, gas, oil, or water  company threatened to shut off services in your home?: No  Health Literacy: Not on file    No family history on file.  Allergies[1]  Review of Systems  Constitutional:  Negative for malaise/fatigue.  HENT: Negative.    Eyes:  Negative for blurred vision and pain.  Respiratory:  Negative for cough and shortness of breath.   Cardiovascular:  Negative for chest pain, palpitations, claudication and leg swelling.  Gastrointestinal:  Negative for abdominal pain, blood in stool, constipation, diarrhea, nausea and vomiting.  Genitourinary:  Negative for dysuria, frequency and urgency.  Musculoskeletal:  Positive for joint pain (left knee; recovering from revision surgery 12/25).  Skin: Negative.   Neurological:  Negative for dizziness, tingling, sensory change and headaches.  Endo/Heme/Allergies: Negative.   Psychiatric/Behavioral: Negative.         Objective:   BP 130/88 (BP Location: Right Arm, Patient Position: Sitting, Cuff Size: Large)   Pulse 87   Ht 5' 6 (1.676 m)   Wt 262 lb 3.2 oz (118.9 kg)   SpO2 97%   BMI 42.32 kg/m   Vitals:   09/17/24 1050 09/17/24 1113  BP: (!) 162/88 130/88  Pulse: 87   Height: 5' 6 (1.676 m)   Weight: 262 lb 3.2 oz (118.9 kg)   SpO2: 97%   BMI (Calculated): 42.34     Physical Exam Vitals and nursing note reviewed.  Constitutional:      Appearance: Normal appearance.  HENT:     Head: Normocephalic.  Eyes:     Extraocular Movements: Extraocular movements intact.     Pupils: Pupils are equal, round, and reactive to light.  Cardiovascular:     Rate and  Rhythm: Normal rate and regular rhythm.     Pulses: Normal pulses.     Heart sounds: Normal heart sounds. No murmur heard. Pulmonary:     Effort: Pulmonary effort is normal. No respiratory distress.     Breath sounds: Normal breath sounds.  Abdominal:     General: There is no distension.     Tenderness: There is no abdominal tenderness.  Musculoskeletal:        General: No tenderness. Normal range of motion.     Cervical back: Normal range of motion and neck supple.     Right lower leg: No edema.     Left lower leg: No edema.  Skin:    General: Skin is warm and dry.     Coloration: Skin is not jaundiced.     Findings: No erythema.  Neurological:     General: No focal deficit present.     Mental Status: She is alert and oriented to person, place, and time.  Psychiatric:        Mood and Affect: Mood normal.        Speech: Speech normal.        Behavior: Behavior is cooperative.        Cognition and Memory: Memory is not impaired.      No results found for any visits on 09/17/24.  Recent Results (from the past 2160 hours)  CMP14+EGFR     Status: Abnormal   Collection Time: 06/20/24 12:08 PM  Result Value Ref Range   Glucose 91 70 - 99 mg/dL   BUN 28 (H) 8 - 27 mg/dL   Creatinine, Ser 8.56 (H) 0.57 - 1.00 mg/dL   eGFR 39 (L) >40 fO/fpw/8.26   BUN/Creatinine Ratio 20 12 - 28   Sodium 141 134 - 144 mmol/L   Potassium 4.8 3.5 - 5.2 mmol/L   Chloride 101 96 - 106 mmol/L   CO2 25 20 - 29 mmol/L   Calcium  11.0 (H) 8.7 - 10.3 mg/dL   Total Protein 7.5 6.0 -  8.5 g/dL   Albumin 4.4 3.8 - 4.8 g/dL   Globulin, Total 3.1 1.5 - 4.5 g/dL   Bilirubin Total 0.3 0.0 - 1.2 mg/dL   Alkaline Phosphatase 69 49 - 135 IU/L   AST 29 0 - 40 IU/L   ALT 31 0 - 32 IU/L  Lipid Panel w/o Chol/HDL Ratio     Status: None   Collection Time: 06/20/24 12:08 PM  Result Value Ref Range   Cholesterol, Total 147 100 - 199 mg/dL   Triglycerides 890 0 - 149 mg/dL   HDL 55 >60 mg/dL   VLDL Cholesterol  Cal 20 5 - 40 mg/dL   LDL Chol Calc (NIH) 72 0 - 99 mg/dL  CBC with Diff     Status: None   Collection Time: 06/20/24 12:08 PM  Result Value Ref Range   WBC 5.2 3.4 - 10.8 x10E3/uL   RBC 4.01 3.77 - 5.28 x10E6/uL   Hemoglobin 11.6 11.1 - 15.9 g/dL   Hematocrit 63.4 65.9 - 46.6 %   MCV 91 79 - 97 fL   MCH 28.9 26.6 - 33.0 pg   MCHC 31.8 31.5 - 35.7 g/dL   RDW 87.0 88.2 - 84.5 %   Platelets 241 150 - 450 x10E3/uL   Neutrophils 58 Not Estab. %   Lymphs 28 Not Estab. %   Monocytes 9 Not Estab. %   Eos 4 Not Estab. %   Basos 1 Not Estab. %   Neutrophils Absolute 3.0 1.4 - 7.0 x10E3/uL   Lymphocytes Absolute 1.5 0.7 - 3.1 x10E3/uL   Monocytes Absolute 0.5 0.1 - 0.9 x10E3/uL   EOS (ABSOLUTE) 0.2 0.0 - 0.4 x10E3/uL   Basophils Absolute 0.0 0.0 - 0.2 x10E3/uL   Immature Granulocytes 0 Not Estab. %   Immature Grans (Abs) 0.0 0.0 - 0.1 x10E3/uL  Hemoglobin A1c     Status: Abnormal   Collection Time: 06/20/24 12:08 PM  Result Value Ref Range   Hgb A1c MFr Bld 6.1 (H) 4.8 - 5.6 %    Comment:          Prediabetes: 5.7 - 6.4          Diabetes: >6.4          Glycemic control for adults with diabetes: <7.0    Est. average glucose Bld gHb Est-mCnc 128 mg/dL  POCT CBG (Fasting - Glucose)     Status: Abnormal   Collection Time: 07/08/24 11:25 AM  Result Value Ref Range   Glucose Fasting, POC 127 (A) 70 - 99 mg/dL  POC CREATINE & ALBUMIN,URINE     Status: Normal   Collection Time: 08/07/24 11:51 AM  Result Value Ref Range   Microalbumin Ur, POC 30 mg/L   Creatinine, POC 200 mg/dL   Albumin/Creatinine Ratio, Urine, POC <30   Surgical pcr screen     Status: None   Collection Time: 08/11/24 11:12 AM   Specimen: Nasal Mucosa; Nasal Swab  Result Value Ref Range   MRSA, PCR NEGATIVE NEGATIVE   Staphylococcus aureus NEGATIVE NEGATIVE    Comment: (NOTE) The Xpert SA Assay (FDA approved for NASAL specimens in patients 26 years of age and older), is one component of a  comprehensive surveillance program. It is not intended to diagnose infection nor to guide or monitor treatment. Performed at [facility], [address] [place], [place] [address]   Type and screen [facility]     Status: None   Collection Time: 08/11/24 11:12 AM  Result Value Ref Range   ABO/RH(D) A POS    Antibody Screen NEG    Sample Expiration 08/25/2024,2359    Extend sample reason      NO TRANSFUSIONS OR PREGNANCY IN THE PAST 3 MONTHS Performed at [facility], [address] [place], [place] [address]   Urinalysis, Complete w Microscopic -Urine, Clean Catch     Status: Abnormal   Collection Time: 08/12/24  9:36 AM  Result Value Ref Range   Color, Urine STRAW (A) YELLOW   APPearance CLEAR (A) CLEAR   Specific Gravity, Urine 1.003 (L) 1.005 - 1.030   pH 6.0 5.0 - 8.0   Glucose, UA >=500 (A) NEGATIVE mg/dL   Hgb urine dipstick NEGATIVE NEGATIVE   Bilirubin Urine NEGATIVE NEGATIVE   Ketones, ur NEGATIVE NEGATIVE mg/dL   Protein, ur NEGATIVE NEGATIVE mg/dL   Nitrite NEGATIVE NEGATIVE   Leukocytes,Ua NEGATIVE NEGATIVE   RBC / HPF 0-5 0 - 5 RBC/hpf   WBC, UA 0 0 - 5 WBC/hpf   Bacteria, UA RARE (A) NONE SEEN   Squamous Epithelial / HPF 0 0 - 5 /HPF    Comment: Performed at [facility], [address] [place], [place] [address]  Surgical pathology     Status: None   Collection Time: 08/18/24 12:00 AM  Result Value Ref Range   SURGICAL PATHOLOGY      SURGICAL PATHOLOGY [facility] [address] [place], [place] [address] Telephone [phone] or [phone] Fax [phone]  REPORT OF SURGICAL PATHOLOGY   Accession #: [id number] Patient Name: CHAZLYN, CUDE Visit # : [id number]  MRN: [id number] Physician: Lorelle Hussar DOB/Age 17-Mar-1951 (Age: 52) Gender: F Collected Date: 08/18/2024 Received Date: 08/18/2024  FINAL DIAGNOSIS       1. Synovium, left knee :       -  FIBROSYNOVIAL TISSUE WITH REACTIVE CHANGES.      - NEGATIVE FOR ACUTE INFLAMMATION.       DATE SIGNED OUT: 08/23/2024 ELECTRONIC SIGNATURE : Coronel Md, Misti, Pathologist, Electronic Signature  MICROSCOPIC DESCRIPTION  CASE COMMENTS STAINS USED IN DIAGNOSIS: H&E H&E         Intraoperative Diagnosis: 1 - no neutrophils identified. called to Dr. Lorelle by Dr. Delaine at 1140 on      08-18-24.  CLINICAL HISTORY  SPECIMEN(S) OBTAINED 1. Synovium, Left Knee  SPECIMEN COMMENTS: SPECIMEN CLINI CAL INFORMATION:    Gross Description 1. Received fresh for frozen section and placed in formalin is a 1.7 x 1 x 0.3 cm fragment of tan soft tissue. 50% is frozen in 1A, all remaining tissue in 1B. mb 08-18-24        Report signed out from the following location(s) [place] [address] [place], [place] [address] CLIA #: [id number]  [facility] [address] [place], [place] [address] CLIA #: [id number]   Glucose, capillary     Status: None   Collection Time: 08/18/24  8:57 AM  Result Value Ref Range   Glucose-Capillary 80 70 - 99 mg/dL    Comment: Glucose reference range applies only to samples taken after fasting for at least 8 hours.  Synovial cell count + diff, w/ crystals     Status: None   Collection Time: 08/18/24 11:13 AM  Result Value Ref Range   Color, Synovial YELLOW YELLOW   Appearance-Synovial CLEAR CLEAR   Crystals, Fluid NO CRYSTALS SEEN    WBC, Synovial 70 0 -  200 /cu mm   Neutrophil, Synovial 35 %   Lymphocytes-Synovial Fld 55 %   Monocyte-Macrophage-Synovial Fluid 10 %   Eosinophils-Synovial 0 %    Comment: Performed at [facility], [address] [place], [place] [address]  Glucose, capillary     Status: None   Collection Time: 08/18/24 12:56 PM  Result Value Ref Range   Glucose-Capillary 96 70 - 99 mg/dL    Comment: Glucose reference range applies only to samples taken after fasting for at  least 8 hours.  Glucose, capillary     Status: Abnormal   Collection Time: 08/18/24  5:38 PM  Result Value Ref Range   Glucose-Capillary 140 (H) 70 - 99 mg/dL    Comment: Glucose reference range applies only to samples taken after fasting for at least 8 hours.  Glucose, capillary     Status: Abnormal   Collection Time: 08/18/24  9:53 PM  Result Value Ref Range   Glucose-Capillary 181 (H) 70 - 99 mg/dL    Comment: Glucose reference range applies only to samples taken after fasting for at least 8 hours.  Glucose, capillary     Status: Abnormal   Collection Time: 08/19/24  7:36 AM  Result Value Ref Range   Glucose-Capillary 141 (H) 70 - 99 mg/dL    Comment: Glucose reference range applies only to samples taken after fasting for at least 8 hours.  CBC     Status: Abnormal   Collection Time: 08/19/24  8:42 AM  Result Value Ref Range   WBC 11.3 (H) 4.0 - 10.5 K/uL   RBC 3.59 (L) 3.87 - 5.11 MIL/uL   Hemoglobin 10.6 (L) 12.0 - 15.0 g/dL   HCT 66.7 (L) 63.9 - 53.9 %   MCV 92.5 80.0 - 100.0 fL   MCH 29.5 26.0 - 34.0 pg   MCHC 31.9 30.0 - 36.0 g/dL   RDW 86.6 88.4 - 84.4 %   Platelets 208 150 - 400 K/uL   nRBC 0.0 0.0 - 0.2 %    Comment: Performed at [facility], [address] [place], [place] [address]  Basic metabolic panel     Status: Abnormal   Collection Time: 08/19/24  8:42 AM  Result Value Ref Range   Sodium 139 135 - 145 mmol/L   Potassium 4.8 3.5 - 5.1 mmol/L   Chloride 104 98 - 111 mmol/L   CO2 28 22 - 32 mmol/L   Glucose, Bld 172 (H) 70 - 99 mg/dL    Comment: Glucose reference range applies only to samples taken after fasting for at least 8 hours.   BUN 19 8 - 23 mg/dL   Creatinine, Ser 8.64 (H) 0.44 - 1.00 mg/dL   Calcium  9.9 8.9 - 10.3 mg/dL   GFR, Estimated 41 (L) >60 mL/min    Comment: (NOTE) Calculated using the CKD-EPI Creatinine Equation (2021)    Anion gap 7 5 - 15    Comment: Performed at [facility], [address] [place],  [place] [address]  Resp panel by RT-PCR (RSV, Flu A&B, Covid) Anterior Nasal Swab     Status: Abnormal   Collection Time: 08/19/24 10:48 AM   Specimen: Anterior Nasal Swab  Result Value Ref Range   SARS Coronavirus 2 by RT PCR NEGATIVE NEGATIVE    Comment: (NOTE) SARS-CoV-2 target nucleic acids are NOT DETECTED.  The SARS-CoV-2 RNA is generally detectable in upper respiratory specimens during the acute phase of infection. The lowest concentration of SARS-CoV-2 viral copies  this assay can detect is 138 copies/mL. A negative result does not preclude SARS-Cov-2 infection and should not be used as the sole basis for treatment or other patient management decisions. A negative result may occur with  improper specimen collection/handling, submission of specimen other than nasopharyngeal swab, presence of viral mutation(s) within the areas targeted by this assay, and inadequate number of viral copies(<138 copies/mL). A negative result must be combined with clinical observations, patient history, and epidemiological information. The expected result is Negative.  Fact Sheet for Patients:  bloggercourse.com  Fact Sheet for Healthcare Providers:  seriousbroker.it  This test is no t yet approved or cleared by the [place]  FDA and  has been authorized for detection and/or diagnosis of SARS-CoV-2 by FDA under an Emergency Use Authorization (EUA). This EUA will remain  in effect (meaning this test can be used) for the duration of the COVID-19 declaration under Section 564(b)(1) of the Act, 21 U.S.C.section 360bbb-3(b)(1), unless the authorization is terminated  or revoked sooner.       Influenza A by PCR POSITIVE (A) NEGATIVE   Influenza B by PCR NEGATIVE NEGATIVE    Comment: (NOTE) The Xpert Xpress SARS-CoV-2/FLU/RSV plus assay is intended as an aid in the diagnosis of influenza from Nasopharyngeal swab specimens and should not be used as a  sole basis for treatment. Nasal washings and aspirates are unacceptable for Xpert Xpress SARS-CoV-2/FLU/RSV testing.  Fact Sheet for Patients: bloggercourse.com  Fact Sheet for Healthcare Providers: seriousbroker.it  This test is not yet approved or cleared by the [place]  FDA and has been authorized for detection and/or diagnosis of SARS-CoV-2 by FDA under an Emergency Use Authorization (EUA). This EUA will remain in effect (meaning this test can be used) for the duration of the COVID-19 declaration under Section 564(b)(1) of the Act, 21 U.S.C. section 360bbb-3(b)(1), unless the authorization is terminated or revoked.     Resp Syncytial Virus by PCR NEGATIVE NEGATIVE    Comment: (NOTE) Fact Sheet for Patients: bloggercourse.com  Fact Sheet for Healthcare Providers: seriousbroker.it  This test is not yet approved or cleared by the [place]  FDA and has been authorized for detection and/or diagnosis of SARS-CoV-2 by FDA under an Emergency Use Authorization (EUA). This EUA will remain in effect (meaning this test can be used) for the duration of the COVID-19 declaration under Section 564(b)(1) of the Act, 21 U.S.C. section 360bbb-3(b)(1), unless the authorization is terminated or revoked.  Performed at [facility], [address] [place], [place] [address]   Glucose, capillary     Status: None   Collection Time: 08/19/24 12:30 PM  Result Value Ref Range   Glucose-Capillary 79 70 - 99 mg/dL    Comment: Glucose reference range applies only to samples taken after fasting for at least 8 hours.  Glucose, capillary     Status: Abnormal   Collection Time: 08/19/24  4:19 PM  Result Value Ref Range   Glucose-Capillary 110 (H) 70 - 99 mg/dL    Comment: Glucose reference range applies only to samples taken after fasting for at least 8 hours.  CBC     Status:  Abnormal   Collection Time: 08/20/24  6:08 AM  Result Value Ref Range   WBC 7.5 4.0 - 10.5 K/uL   RBC 3.52 (L) 3.87 - 5.11 MIL/uL   Hemoglobin 10.4 (L) 12.0 - 15.0 g/dL   HCT 68.1 (L) 63.9 - 53.9 %   MCV 90.3 80.0 - 100.0 fL   MCH 29.5 26.0 -  34.0 pg   MCHC 32.7 30.0 - 36.0 g/dL   RDW 85.8 88.4 - 84.4 %   Platelets 202 150 - 400 K/uL   nRBC 0.0 0.0 - 0.2 %    Comment: Performed at [facility], [address] [place], [place] [address]  Glucose, capillary     Status: Abnormal   Collection Time: 08/20/24  7:42 AM  Result Value Ref Range   Glucose-Capillary 46 (L) 70 - 99 mg/dL    Comment: Glucose reference range applies only to samples taken after fasting for at least 8 hours.  Glucose, capillary     Status: Abnormal   Collection Time: 08/20/24  8:10 AM  Result Value Ref Range   Glucose-Capillary 54 (L) 70 - 99 mg/dL    Comment: Glucose reference range applies only to samples taken after fasting for at least 8 hours.  Glucose, capillary     Status: Abnormal   Collection Time: 08/20/24  8:33 AM  Result Value Ref Range   Glucose-Capillary 57 (L) 70 - 99 mg/dL    Comment: Glucose reference range applies only to samples taken after fasting for at least 8 hours.  Glucose, capillary     Status: None   Collection Time: 08/20/24  9:03 AM  Result Value Ref Range   Glucose-Capillary 91 70 - 99 mg/dL    Comment: Glucose reference range applies only to samples taken after fasting for at least 8 hours.  Glucose, capillary     Status: None   Collection Time: 08/20/24 11:52 AM  Result Value Ref Range   Glucose-Capillary 98 70 - 99 mg/dL    Comment: Glucose reference range applies only to samples taken after fasting for at least 8 hours.  Glucose, capillary     Status: Abnormal   Collection Time: 08/20/24  2:50 PM  Result Value Ref Range   Glucose-Capillary 102 (H) 70 - 99 mg/dL    Comment: Glucose reference range applies only to samples taken after fasting for at least 8 hours.   Glucose, capillary     Status: None   Collection Time: 08/20/24  8:07 PM  Result Value Ref Range   Glucose-Capillary 95 70 - 99 mg/dL    Comment: Glucose reference range applies only to samples taken after fasting for at least 8 hours.  Glucose, capillary     Status: Abnormal   Collection Time: 08/21/24  8:16 AM  Result Value Ref Range   Glucose-Capillary 130 (H) 70 - 99 mg/dL    Comment: Glucose reference range applies only to samples taken after fasting for at least 8 hours.  Glucose, capillary     Status: Abnormal   Collection Time: 08/21/24 11:35 AM  Result Value Ref Range   Glucose-Capillary 102 (H) 70 - 99 mg/dL    Comment: Glucose reference range applies only to samples taken after fasting for at least 8 hours.  Glucose, capillary     Status: Abnormal   Collection Time: 08/21/24  4:49 PM  Result Value Ref Range   Glucose-Capillary 131 (H) 70 - 99 mg/dL    Comment: Glucose reference range applies only to samples taken after fasting for at least 8 hours.  Glucose, capillary     Status: None   Collection Time: 08/21/24  9:10 PM  Result Value Ref Range   Glucose-Capillary 98 70 - 99 mg/dL    Comment: Glucose reference range applies only to samples taken after fasting for at least 8 hours.  Glucose, capillary  Status: Abnormal   Collection Time: 08/22/24  8:10 AM  Result Value Ref Range   Glucose-Capillary 163 (H) 70 - 99 mg/dL    Comment: Glucose reference range applies only to samples taken after fasting for at least 8 hours.  Glucose, capillary     Status: Abnormal   Collection Time: 08/22/24 11:42 AM  Result Value Ref Range   Glucose-Capillary 176 (H) 70 - 99 mg/dL    Comment: Glucose reference range applies only to samples taken after fasting for at least 8 hours.       Assessment & Plan:   Assessment & Plan Hypertension associated with diabetes (HCC) CHF (congestive heart failure), NYHA class I, acute, systolic (HCC) Combined hyperlipidemia associated with type  2 diabetes mellitus (HCC) CKD stage 3b, GFR 30-44 ml/min (HCC) Morbid obesity with BMI of 40.0-44.9, adult (HCC) - Reinforced healthy diet and exercise as tolerated. - Start Losartan  25 mg daily. - Continue other medications as prescribed. - Continue holding lasix  at this time. - Check labs not due at this time. Will collect fasting labs when she returns for pre-scheduled FU 10/02/24  - Keep specialist appointments as scheduled.    No follow-ups on file.   Total time spent: 30 minutes  Oddis DELENA Cain, FNP  09/17/2024   This document may have been prepared by [facility] Voice Recognition software and as such may include unintentional dictation errors.      [1] No Known Allergies  "

## 2024-09-19 ENCOUNTER — Telehealth: Payer: Self-pay | Admitting: Internal Medicine

## 2024-09-19 ENCOUNTER — Ambulatory Visit: Admitting: Internal Medicine

## 2024-09-19 NOTE — Telephone Encounter (Signed)
 Left VM requesting OT for 2w2, then 1w1.   Per Neelam this okay.  Callback # [phone]

## 2024-09-22 ENCOUNTER — Ambulatory Visit: Admitting: Cardiovascular Disease

## 2024-09-23 ENCOUNTER — Ambulatory Visit: Admitting: Cardiovascular Disease

## 2024-09-23 ENCOUNTER — Encounter: Payer: Self-pay | Admitting: Cardiovascular Disease

## 2024-09-23 VITALS — BP 118/76 | HR 74 | Ht 66.0 in | Wt 263.4 lb

## 2024-09-23 DIAGNOSIS — T50905A Adverse effect of unspecified drugs, medicaments and biological substances, initial encounter: Secondary | ICD-10-CM

## 2024-09-23 DIAGNOSIS — Z6839 Body mass index (BMI) 39.0-39.9, adult: Secondary | ICD-10-CM

## 2024-09-23 DIAGNOSIS — E66812 Obesity, class 2: Secondary | ICD-10-CM

## 2024-09-23 DIAGNOSIS — E1159 Type 2 diabetes mellitus with other circulatory complications: Secondary | ICD-10-CM

## 2024-09-23 DIAGNOSIS — G4733 Obstructive sleep apnea (adult) (pediatric): Secondary | ICD-10-CM | POA: Diagnosis not present

## 2024-09-23 DIAGNOSIS — R001 Bradycardia, unspecified: Secondary | ICD-10-CM | POA: Diagnosis not present

## 2024-09-23 DIAGNOSIS — N1832 Chronic kidney disease, stage 3b: Secondary | ICD-10-CM

## 2024-09-23 DIAGNOSIS — E0800 Diabetes mellitus due to underlying condition with hyperosmolarity without nonketotic hyperglycemic-hyperosmolar coma (NKHHC): Secondary | ICD-10-CM

## 2024-09-23 DIAGNOSIS — I152 Hypertension secondary to endocrine disorders: Secondary | ICD-10-CM

## 2024-09-23 DIAGNOSIS — E782 Mixed hyperlipidemia: Secondary | ICD-10-CM | POA: Diagnosis not present

## 2024-09-23 DIAGNOSIS — Z6841 Body Mass Index (BMI) 40.0 and over, adult: Secondary | ICD-10-CM

## 2024-09-23 DIAGNOSIS — Z96652 Presence of left artificial knee joint: Secondary | ICD-10-CM | POA: Diagnosis not present

## 2024-09-23 DIAGNOSIS — I5021 Acute systolic (congestive) heart failure: Secondary | ICD-10-CM | POA: Diagnosis not present

## 2024-09-23 DIAGNOSIS — E1169 Type 2 diabetes mellitus with other specified complication: Secondary | ICD-10-CM

## 2024-09-23 MED ORDER — CARVEDILOL 3.125 MG PO TABS: 3.1250 mg | ORAL_TABLET | Freq: Two times a day (BID) | ORAL | 11 refills | Status: AC

## 2024-10-02 ENCOUNTER — Ambulatory Visit: Admitting: Internal Medicine

## 2024-10-21 ENCOUNTER — Ambulatory Visit: Admitting: Cardiovascular Disease
